# Patient Record
Sex: Male | Born: 1955
Health system: Southern US, Community
[De-identification: ages and names within clinical notes are randomized; demographics above are authoritative.]

## PROBLEM LIST (undated history)

## (undated) DIAGNOSIS — N179 Acute kidney failure, unspecified: Secondary | ICD-10-CM

## (undated) DIAGNOSIS — I639 Cerebral infarction, unspecified: Secondary | ICD-10-CM

## (undated) DIAGNOSIS — I6381 Other cerebral infarction due to occlusion or stenosis of small artery: Secondary | ICD-10-CM

## (undated) DIAGNOSIS — D170 Benign lipomatous neoplasm of skin and subcutaneous tissue of head, face and neck: Secondary | ICD-10-CM

## (undated) DIAGNOSIS — N400 Enlarged prostate without lower urinary tract symptoms: Secondary | ICD-10-CM

## (undated) HISTORY — DX: Benign prostatic hyperplasia without lower urinary tract symptoms: N40.0

## (undated) HISTORY — DX: Cerebral infarction, unspecified: I63.9

## (undated) HISTORY — DX: Acute kidney failure, unspecified: N17.9

---

## 2005-07-04 ENCOUNTER — Emergency Department (HOSPITAL_COMMUNITY): Admission: EM | Admit: 2005-07-04 | Discharge: 2005-07-04 | Payer: Self-pay | Admitting: Emergency Medicine

## 2005-07-07 ENCOUNTER — Emergency Department (HOSPITAL_COMMUNITY): Admission: EM | Admit: 2005-07-07 | Discharge: 2005-07-07 | Payer: Self-pay | Admitting: Emergency Medicine

## 2005-07-10 ENCOUNTER — Emergency Department (HOSPITAL_COMMUNITY): Admission: EM | Admit: 2005-07-10 | Discharge: 2005-07-10 | Payer: Self-pay | Admitting: Emergency Medicine

## 2005-07-12 ENCOUNTER — Emergency Department (HOSPITAL_COMMUNITY): Admission: EM | Admit: 2005-07-12 | Discharge: 2005-07-12 | Payer: Self-pay | Admitting: *Deleted

## 2005-07-13 ENCOUNTER — Emergency Department (HOSPITAL_COMMUNITY): Admission: EM | Admit: 2005-07-13 | Discharge: 2005-07-13 | Payer: Self-pay | Admitting: Family Medicine

## 2008-09-03 ENCOUNTER — Emergency Department (HOSPITAL_COMMUNITY): Admission: EM | Admit: 2008-09-03 | Discharge: 2008-09-03 | Payer: Self-pay | Admitting: Emergency Medicine

## 2008-10-25 ENCOUNTER — Ambulatory Visit: Payer: Self-pay | Admitting: Family Medicine

## 2008-11-05 ENCOUNTER — Encounter: Payer: Self-pay | Admitting: Sports Medicine

## 2008-11-05 ENCOUNTER — Ambulatory Visit: Payer: Self-pay | Admitting: Family Medicine

## 2008-11-05 DIAGNOSIS — M7520 Bicipital tendinitis, unspecified shoulder: Secondary | ICD-10-CM | POA: Insufficient documentation

## 2008-11-05 LAB — CONVERTED CEMR LAB
Cholesterol: 184 mg/dL (ref 0–200)
HDL: 49 mg/dL (ref >39)
LDL Cholesterol: 106 mg/dL — ABNORMAL HIGH (ref 0–99)
PSA: 0.75 ng/mL (ref 0.10–4.00)
Total CHOL/HDL Ratio: 3.8
Triglycerides: 144 mg/dL (ref <150)
VLDL: 29 mg/dL (ref 0–40)

## 2008-11-28 ENCOUNTER — Encounter: Payer: Self-pay | Admitting: *Deleted

## 2008-11-28 ENCOUNTER — Ambulatory Visit: Payer: Self-pay | Admitting: Family Medicine

## 2008-12-03 ENCOUNTER — Ambulatory Visit: Payer: Self-pay

## 2008-12-03 DIAGNOSIS — M75120 Complete rotator cuff tear or rupture of unspecified shoulder, not specified as traumatic: Secondary | ICD-10-CM | POA: Insufficient documentation

## 2008-12-26 ENCOUNTER — Ambulatory Visit: Payer: Self-pay | Admitting: Sports Medicine

## 2009-02-07 ENCOUNTER — Ambulatory Visit (HOSPITAL_COMMUNITY): Admission: RE | Admit: 2009-02-07 | Discharge: 2009-02-07 | Payer: Self-pay | Admitting: Family Medicine

## 2009-02-07 ENCOUNTER — Ambulatory Visit: Payer: Self-pay | Admitting: Family Medicine

## 2009-02-07 ENCOUNTER — Encounter: Payer: Self-pay | Admitting: Sports Medicine

## 2009-02-07 ENCOUNTER — Telehealth: Payer: Self-pay | Admitting: Sports Medicine

## 2009-02-07 LAB — CONVERTED CEMR LAB
BUN: 18 mg/dL (ref 6–23)
CO2: 24 meq/L (ref 19–32)
Calcium: 9.4 mg/dL (ref 8.4–10.5)
Chloride: 107 meq/L (ref 96–112)
Creatinine, Ser: 1.16 mg/dL (ref 0.40–1.50)
Glucose, Bld: 73 mg/dL (ref 70–99)
HCT: 37.8 % — ABNORMAL LOW (ref 39.0–52.0)
Hemoglobin: 12.3 g/dL — ABNORMAL LOW (ref 13.0–17.0)
MCHC: 32.5 g/dL (ref 30.0–36.0)
MCV: 83.6 fL (ref 78.0–100.0)
Platelets: 258 K/uL (ref 150–400)
Potassium: 4.1 meq/L (ref 3.5–5.3)
RBC: 4.52 M/uL (ref 4.22–5.81)
RDW: 14.5 % (ref 11.5–15.5)
Sodium: 141 meq/L (ref 135–145)
WBC: 4.3 10*3/microliter (ref 4.0–10.5)

## 2009-02-13 ENCOUNTER — Encounter: Payer: Self-pay | Admitting: Sports Medicine

## 2009-02-21 ENCOUNTER — Ambulatory Visit: Payer: Self-pay | Admitting: Family Medicine

## 2009-02-21 DIAGNOSIS — D649 Anemia, unspecified: Secondary | ICD-10-CM | POA: Insufficient documentation

## 2009-02-26 ENCOUNTER — Ambulatory Visit (HOSPITAL_COMMUNITY): Admission: RE | Admit: 2009-02-26 | Discharge: 2009-02-26 | Payer: Self-pay | Admitting: Family Medicine

## 2009-02-26 ENCOUNTER — Encounter: Payer: Self-pay | Admitting: Family Medicine

## 2009-04-09 ENCOUNTER — Ambulatory Visit: Payer: Self-pay | Admitting: Family Medicine

## 2009-04-09 ENCOUNTER — Encounter: Payer: Self-pay | Admitting: Sports Medicine

## 2009-04-09 LAB — CONVERTED CEMR LAB
HCT: 36.6 % — ABNORMAL LOW (ref 39.0–52.0)
Hemoglobin: 11.8 g/dL — ABNORMAL LOW (ref 13.0–17.0)
Iron: 104 ug/dL (ref 42–165)
MCHC: 32.2 g/dL (ref 30.0–36.0)
MCV: 85.7 fL (ref 78.0–100.0)
Platelets: 265 10*3/uL (ref 150–400)
RBC: 4.27 M/uL (ref 4.22–5.81)
RDW: 14.4 % (ref 11.5–15.5)
Retic Ct Pct: 1 % (ref 0.4–3.1)
Saturation Ratios: 31 % (ref 20–55)
TIBC: 331 ug/dL (ref 215–435)
UIBC: 227 ug/dL
WBC: 3.7 10*3/microliter — ABNORMAL LOW (ref 4.0–10.5)

## 2009-04-17 ENCOUNTER — Ambulatory Visit: Payer: Self-pay

## 2009-04-18 ENCOUNTER — Encounter: Payer: Self-pay | Admitting: Sports Medicine

## 2009-04-23 ENCOUNTER — Ambulatory Visit: Payer: Self-pay | Admitting: Family Medicine

## 2009-04-23 ENCOUNTER — Encounter: Payer: Self-pay | Admitting: Sports Medicine

## 2009-04-23 LAB — CONVERTED CEMR LAB
Basophils Absolute: 0 10*3/uL (ref 0.0–0.1)
Basophils Relative: 1 % (ref 0–1)
Eosinophils Absolute: 0.1 K/uL (ref 0.0–0.7)
Eosinophils Relative: 4 % (ref 0–5)
HCT: 39.2 % (ref 39.0–52.0)
Hemoglobin: 12.6 g/dL — ABNORMAL LOW (ref 13.0–17.0)
Hgb A2 Quant: 2.6 % (ref 2.2–3.2)
Hgb A: 97.4 % (ref 96.8–97.8)
Hgb F Quant: 0 % (ref 0.0–2.0)
Hgb S Quant: 0 % (ref 0.0–0.0)
Lymphocytes Relative: 39 % (ref 12–46)
Lymphs Abs: 1.3 K/uL (ref 0.7–4.0)
MCHC: 32.1 g/dL (ref 30.0–36.0)
MCV: 87.5 fL (ref 78.0–100.0)
Monocytes Absolute: 0.6 10*3/uL (ref 0.1–1.0)
Monocytes Relative: 18 % — ABNORMAL HIGH (ref 3–12)
Neutro Abs: 1.3 10*3/uL — ABNORMAL LOW (ref 1.7–7.7)
Neutrophils Relative %: 39 % — ABNORMAL LOW (ref 43–77)
Platelets: 289 10*3/uL (ref 150–400)
RBC: 4.48 M/uL (ref 4.22–5.81)
RDW: 14.7 % (ref 11.5–15.5)
Sickle Cell Screen: NEGATIVE
Vitamin B-12: 953 pg/mL — ABNORMAL HIGH (ref 211–911)
WBC: 3.3 10*3/microliter — ABNORMAL LOW (ref 4.0–10.5)

## 2009-05-01 ENCOUNTER — Encounter: Admission: RE | Admit: 2009-05-01 | Discharge: 2009-06-12 | Payer: Self-pay | Admitting: Sports Medicine

## 2009-05-01 ENCOUNTER — Telehealth: Payer: Self-pay | Admitting: *Deleted

## 2009-05-01 ENCOUNTER — Encounter: Payer: Self-pay | Admitting: Sports Medicine

## 2009-05-03 ENCOUNTER — Ambulatory Visit: Payer: Self-pay | Admitting: Gastroenterology

## 2009-05-09 ENCOUNTER — Encounter: Payer: Self-pay | Admitting: Sports Medicine

## 2009-05-10 ENCOUNTER — Telehealth: Payer: Self-pay | Admitting: Gastroenterology

## 2009-05-16 ENCOUNTER — Ambulatory Visit: Payer: Self-pay | Admitting: Gastroenterology

## 2009-05-16 ENCOUNTER — Encounter: Payer: Self-pay | Admitting: Gastroenterology

## 2009-05-21 ENCOUNTER — Encounter: Payer: Self-pay | Admitting: Gastroenterology

## 2009-07-10 ENCOUNTER — Ambulatory Visit: Payer: Self-pay | Admitting: Sports Medicine

## 2009-07-11 ENCOUNTER — Ambulatory Visit: Payer: Self-pay | Admitting: Family Medicine

## 2009-07-11 ENCOUNTER — Encounter: Payer: Self-pay | Admitting: Sports Medicine

## 2009-07-11 LAB — CONVERTED CEMR LAB
ALT: 36 U/L (ref 0–53)
AST: 38 U/L — ABNORMAL HIGH (ref 0–37)
Albumin: 4 g/dL (ref 3.5–5.2)
Alkaline Phosphatase: 104 units/L (ref 39–117)
BUN: 16 mg/dL (ref 6–23)
CO2: 25 meq/L (ref 19–32)
Calcium: 9.1 mg/dL (ref 8.4–10.5)
Chloride: 105 meq/L (ref 96–112)
Creatinine, Ser: 1.25 mg/dL (ref 0.40–1.50)
Glucose, Bld: 88 mg/dL (ref 70–99)
Potassium: 4.1 meq/L (ref 3.5–5.3)
Sodium: 141 meq/L (ref 135–145)
Total Bilirubin: 0.3 mg/dL (ref 0.3–1.2)
Total Protein: 7.6 g/dL (ref 6.0–8.3)

## 2009-07-18 ENCOUNTER — Encounter (INDEPENDENT_AMBULATORY_CARE_PROVIDER_SITE_OTHER): Payer: Self-pay | Admitting: *Deleted

## 2009-10-14 ENCOUNTER — Telehealth (INDEPENDENT_AMBULATORY_CARE_PROVIDER_SITE_OTHER): Payer: Self-pay | Admitting: Family Medicine

## 2010-01-08 ENCOUNTER — Ambulatory Visit: Payer: Self-pay | Admitting: Sports Medicine

## 2010-02-06 ENCOUNTER — Telehealth (INDEPENDENT_AMBULATORY_CARE_PROVIDER_SITE_OTHER): Payer: Self-pay | Admitting: Family Medicine

## 2010-02-19 ENCOUNTER — Ambulatory Visit: Payer: Self-pay | Admitting: Sports Medicine

## 2010-03-06 ENCOUNTER — Ambulatory Visit: Payer: Self-pay | Admitting: Family Medicine

## 2010-03-06 ENCOUNTER — Encounter: Payer: Self-pay | Admitting: Sports Medicine

## 2010-03-06 DIAGNOSIS — F528 Other sexual dysfunction not due to a substance or known physiological condition: Secondary | ICD-10-CM | POA: Insufficient documentation

## 2010-03-06 LAB — CONVERTED CEMR LAB
Basophils Absolute: 0 10*3/uL (ref 0.0–0.1)
Basophils Relative: 1 % (ref 0–1)
Eosinophils Absolute: 0.1 K/uL (ref 0.0–0.7)
Eosinophils Relative: 2 % (ref 0–5)
HCT: 39 % (ref 39.0–52.0)
Hemoglobin: 12.9 g/dL — ABNORMAL LOW (ref 13.0–17.0)
Lymphocytes Relative: 36 % (ref 12–46)
Lymphs Abs: 1.4 K/uL (ref 0.7–4.0)
MCHC: 33.1 g/dL (ref 30.0–36.0)
MCV: 86.3 fL (ref 78.0–100.0)
Monocytes Absolute: 0.5 K/uL (ref 0.1–1.0)
Monocytes Relative: 13 % — ABNORMAL HIGH (ref 3–12)
Neutro Abs: 2 10*3/uL (ref 1.7–7.7)
Neutrophils Relative %: 49 % (ref 43–77)
Platelets: 272 10*3/uL (ref 150–400)
RBC: 4.52 M/uL (ref 4.22–5.81)
RDW: 15.4 % (ref 11.5–15.5)
WBC: 4 10*3/microliter (ref 4.0–10.5)

## 2010-04-01 ENCOUNTER — Encounter: Payer: Self-pay | Admitting: Sports Medicine

## 2010-06-03 ENCOUNTER — Ambulatory Visit: Payer: Self-pay | Admitting: Family Medicine

## 2010-06-05 ENCOUNTER — Telehealth: Payer: Self-pay | Admitting: Family Medicine

## 2010-06-06 ENCOUNTER — Telehealth: Payer: Self-pay | Admitting: *Deleted

## 2010-06-09 ENCOUNTER — Telehealth: Payer: Self-pay | Admitting: *Deleted

## 2010-06-17 ENCOUNTER — Ambulatory Visit: Payer: Self-pay | Admitting: Family Medicine

## 2010-07-07 ENCOUNTER — Encounter: Payer: Self-pay | Admitting: Sports Medicine

## 2010-10-02 NOTE — Assessment & Plan Note (Signed)
 Summary: FU/KH   Vital Signs:  Patient profile:   55 year old male Height:      70.5 inches Weight:      154.3 pounds BMI:     21.91 Temp:     98.5 degrees F oral Pulse rate:   91 / minute BP sitting:   114 / 80  (left arm)  Vitals Entered By: Letitia Reusing (November 28, 2008 3:05 PM) Hemoccult Result Date:  11/28/2008 Hemoccult Result:  normal Hemoccult Next Due:  1 yr  Is Patient Diabetic? No   History of Present Illness: 39 male with no significant PMHx presents for CPE and follow up of his shoulder pain.  Shoulder pain:  Present  ~ 3 months, see details in previous note, character unchanged.  Present ith overhead activities, anterior shoulder, right side.    CPE: No complaints, issues, or problems.  Pt reports that he is still trying to get [facility] certified so he can get his colonoscopy.  Habits & Providers     Tobacco Status: never  Allergies: No Known Drug Allergies  Past History:  Past Medical History:    Pyelonephritis - 1971 and 1977 (11/05/2008)  Past Surgical History:    Appendectomy-1966    Left knee tendon repair - 1994     (10/25/2008)  Family History:    Family History Hypertension- mother     Diabetes, HTN - Grandmother    Father died of unknown causes in his 55s (11/05/2008)  Social History:    Pt lives alone, but has a good friend that he spends time with - [name].  She is also a patient at [facility].  He enjoys playing cards and basketball.  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 (11/05/2008)  Review of Systems       12 point negative except as in HPI  Physical Exam  General:  Well-developed,well-nourished,in no acute distress; alert,appropriate and cooperative throughout examination Head:  Normocephalic and atraumatic without obvious abnormalities.  Eyes:  No corneal or conjunctival inflammation noted. EOMI. Perrla. Ears:  External ear exam shows no significant lesions or deformities.  Nose:  External nasal  examination shows no deformity or inflammation. Nasal mucosa are pink and moist without lesions or exudates. Mouth:  Oral mucosa and oropharynx without lesions or exudates.  Neck:  No deformities, masses, or tenderness noted. Chest Wall:  No deformities, masses, tenderness or gynecomastia noted. Lungs:  Normal respiratory effort, chest expands symmetrically. Lungs are clear to auscultation, no crackles or wheezes. Heart:  Normal rate and regular rhythm. S1 and S2 normal without gallop, murmur, click, rub or other extra sounds. Abdomen:  Bowel sounds positive,abdomen soft and non-tender without masses, organomegaly or hernias noted. Rectal:  No external abnormalities noted. Normal sphincter tone. No rectal masses or tenderness. Prostate:  Prostate gland firm and smooth, no enlargement, nodularity, tenderness, mass, asymmetry or induration.   Shoulder/Elbow Exam  General:    Well-developed, well-nourished, normal body habitus; no deformities, normal grooming.    Skin:    Intact, no scars, lesions, rashes, cafe au lait spots or bruising.    Inspection:    Inspection is normal.    Palpation:    tenderness R-biciptal tendon:   Vascular:       Sensory:    Gross sensation intact in the upper extremities.    Motor:    Normal strength in the upper extremities.    Reflexes:    Normal reflexes in the upper extremities.    Shoulder  Exam:    Right:    Inspection:  Normal    Palpation:  Abnormal       Location:  right bicipital groove    Stability:  stable    Tenderness:  right bicipital groove    Swelling:  no    Erythema:  no    Popping and pain on palpation right bicipital groove with internal and external rotation of glenohumeral joint.    Left:    Inspection:  Normal    Palpation:  Normal    Stability:  stable    Tenderness:  no    Swelling:  no    Erythema:  no  Forearm Exam:    Right:    Inspection:  Normal    Palpation:  Normal    Stability:  stable     Tenderness:  no    Swelling:  no    Erythema:  no    Left:    Inspection:  Normal    Palpation:  Normal    Stability:  stable    Tenderness:  no    Swelling:  no    Erythema:  no  Elbow Exam:    Right:    Inspection:  Normal    Palpation:  Normal    Stability:  stable    Tenderness:  no    Swelling:  no    Erythema:  no    Left:    Inspection:  Normal    Palpation:  Normal    Stability:  stable    Tenderness:  no    Swelling:  no    Erythema:  no  Impingement Sign NEER:    Right negative Impingement Sign HAWKINS:    Right negative Apprehension Sign:    Right positive Yerguson:    Right positive Speeds:    Right positive   Impression & Recommendations:  Problem # 1:  BICEPS TENDINITIS, RIGHT (ICD-726.12) Symptoms and sins suggestive of bicipital tendinosis.   Will refer to sports medicine for further eval and possible sheath injections.  Pain fairly well controlled with ultram .   Orders: [facility] - Est  40-64 yrs [phone]) Sports Medicine (Sports Med)  Problem # 2:  Preventive Health Care (ICD-V70.0) Normal physical exam.  Hemoccult negative, pt working on [employer] for colonoscopy.  Orders: [facility] - Est  40-64 yrs (00603)  Complete Medication List: 1)  Adult Aspirin  Ec Low Strength 81 Mg Tbec (Aspirin ) .... One tab by mouth daily 2)  Ultram  50 Mg Tabs (Tramadol  hcl) .... One tab by mouth q4h as needed for pain  Patient Instructions: 1)  Good to see you today, with the location of your pain it seems like you have biceps tendinosis.  I'm glad the ultram  is working well but I would like the sports medicine specialists to see you and weight in on your diagnosis and treatment.  I will set you up with a referral to them.  Your physical exam was completely normal. 2)  Come back to see me in one year, or sooner if you have any problems or concerns.  Try to get [facility] certified so you can get your colonoscopy.  Call the office when you are certified, and call if you need  any refills. 3)  See me back in one year. 4)  -Dr. ONEIDA.

## 2010-10-02 NOTE — Assessment & Plan Note (Signed)
 Summary: FU/KH   Vital Signs:  Patient profile:   55 year old male Weight:      151.2 pounds Temp:     97.0 degrees F oral Pulse rate:   85 / minute BP sitting:   126 / 88  (left arm)  Vitals Entered By: Letitia Reusing (February 21, 2009 1:54 PM) CC: f/u Is Patient Diabetic? No   Primary Care Provider:  Debby Petties MD  CC:  f/u.  History of Present Illness: 19M with Hx dizziness here for fu of this.  Description from previous note:  Describes it as both spinning room and almost passing out feeling.  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  Has never actually passed out.  Drinks plenty of fluids, normal and unchanged urine output, no CP, no SOB, no diaphoresis.  Usually works in the morning before it gets hot.  Does not occur with turning of head, No weakness, numbness, tingling, dysarthria.  This has never happened before.  When he does get the symptoms, they resolve spontaneously in a matter of seconds, no association with rest.     Today, has been going on for 3 weeks, feels like it is happening less often.  I had heard a systolic murmur at the last visit and was concerned this may be AS.  Plan was to get an ECHO but he couldnt afford this and was working on getting [employer].  Still no CP, SOB, syncope.  Has his last meeting with [facility] today and should be able to get an ECHO done.    Habits & Providers  Alcohol-Tobacco-Diet     Tobacco Status: never  Allergies: No Known Drug Allergies  Past History:  Past Medical History: Last updated: 11/05/2008 Pyelonephritis - 1971 and 1977  Past Surgical History: Last updated: 10/25/2008 Appendectomy-1966 Left knee tendon repair - 1994  Family History: Last updated: 11/05/2008 Family History Hypertension- mother  Diabetes, HTN - Grandmother  Father died of unknown causes in his 27s  Social History: Last updated: 11/05/2008 Pt lives alone,  but has a good friend that he spends time with - [name].  She is also a patient at [facility].  He enjoys playing cards and basketball.  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  Review of Systems       See HPI  Physical Exam  General:  Well-developed,well-nourished,in no acute distress; alert,appropriate and cooperative throughout examination Eyes:  No corneal or conjunctival inflammation noted. EOMI. Perrla. Funduscopic exam benign, without hemorrhages, exudates or papilledema. Vision grossly normal. Lungs:  Normal respiratory effort, chest expands symmetrically. Lungs are clear to auscultation, no crackles or wheezes. Heart:  Normal rate and regular rhythm. S1 and S2 normal without gallop, click, rub or other extra sounds. 2/6 systolic murmur auscultated in aortic area and at cardiac apex.  No radiation.  No change with valsalva or change to standing position.  Pulses 2+ throughout. Abdomen:  Bowel sounds positive,abdomen soft and non-tender without masses, organomegaly or hernias noted. Extremities:  No clubbing, cyanosis, edema, or deformity noted with normal full range of motion of all joints.     Impression & Recommendations:  Problem # 1:  DIZZINESS (ICD-780.4) Assessment Improved This is his primary complaint, improved slightly, negative orthostatic vitals, negative EKG, normal BMET, CBC with mildly low Hb but normal MCV.  With systolic murmur need to get ECHO to r/o AS or other valvular abnormality.  Orders: [facility]- Est  Level 4 (99214) 2 D Echo (2 D Echo)  Problem # 2:  SYSTOLIC MURMUR (PRI-214.2) Assessment: Unchanged Still present, will check ECHO.  Earliest opening is Tuesday the 29th.  Orders: [facility]- Est  Level 4 (99214) 2 D Echo (2 D Echo)  Problem # 3:  ANEMIA, NORMOCYTIC (ICD-285.9) Assessment: New Unclear etiology.  As this pt has not had a colonoscopy it is possible there is an occult GI bleed.  If he gets [employer] certified I would like to get  him scheduled for a colonoscopy ASAP.  Will do Iron studies at next visit, Hb electrophoresis.  Complete Medication List: 1)  Adult Aspirin  Ec Low Strength 81 Mg Tbec (Aspirin ) .... One tab by mouth daily 2)  Ultram  50 Mg Tabs (Tramadol  hcl) .... One tab by mouth q4h as needed for pain 3)  Mobic  15 Mg Tabs (Meloxicam ) .... One tab by mouth qday with food  Patient Instructions: 1)  Good to see you today, 2)  I want you to get an ECHO of you heart.  Continue to keep away from sports and physical activity for now. 3)  Drink  ~8 glasses of water or gatorade each day. 4)  Call 911 if you have any chest pain or pass out. 5)  Come back to see me in 4 weeks.  6)  OK to double book appointments. 7)  Continue to work on getting [employer] certified.  Call the office if you are unable to get the ECHO done within a week. 8)  -Dr. ONEIDA.  Flex Sig Next Due:  Not Indicated

## 2010-10-02 NOTE — Assessment & Plan Note (Signed)
Summary: David [name]   Vital Signs:  Patient profile:   55 year old male Weight:      164.2 pounds Temp:     98.5 degrees F oral Pulse rate:   92 / minute Pulse rhythm:   regular BP sitting:   124 / 81  (right arm) Cuff size:   regular  Vitals Entered By: Audelia Hives CMA (March 06, 2010 3:11 PM) CC: follow-up visit   Primary Care Provider:  [name] MD  CC:  follow-up visit.  History of Present Illness: Here for several issues:  R supraspinatus tear:  Has been followed at [facility], doing better with strength and ROM but still not back to baseline.  Wrist pain:  hx fx in wrist, now with arthritis at what he indicates at the radiocarpal joint.  Persistently painful.  Never injected.  Erectile dysfunction:  Has been a chronic issue.  Unable to attain satisfactor erection for penetration, also unable to maintain satisfactory erection during masturbation.  Often wakes up from sleep with full erections however.  No significant stressors in his life.  No saddle numbness, no back pain, no stool/urinary incontinence.  Vision:  Now with bifocals.  Pancytopenia:  Has been taking iron now regularly.    Current Medications (verified): 1)  Adult Aspirin Ec Low Strength 81 Mg Tbec (Aspirin) .... One Tab By Mouth Daily 2)  Ultram 50 Mg Tabs (Tramadol Hcl) .... One Tab By Mouth Q4h As Needed For Pain 3)  Mobic 15 Mg Tabs (Meloxicam) .... One Tab By Mouth Qday With Food 4)  Amitriptyline Hcl 50 Mg Tabs (Amitriptyline Hcl) .Marland Kitchen.. 1 By Mouth Qhs 5)  Fe Tabs 325 (65 Fe) Mg Tbec (Ferrous Sulfate) .... One Tab By Mouth Bid 6)  Genteal Mild 0.2 % Soln (Hypromellose) .... 2 Drops in Each Eye As Needed For Burning Sensation. 7)  Viagra 100 Mg Tabs (Sildenafil Citrate) .... One Tab 30 Mins To An Hour Before Relations.  Allergies (verified): 1)  ! Codeine  Past History:  Past Medical History: Pyelonephritis - 1971 and 1977 Right supraspinatus tear (2010) Left radiocarpal arthritis  Review of  Systems       See HPI  Physical Exam  General:  Well-developed,well-nourished,in no acute distress; alert,appropriate and cooperative throughout examination Lungs:  Normal respiratory effort, chest expands symmetrically. Lungs are clear to auscultation, no crackles or wheezes. Heart:  Normal rate and regular rhythm. S1 and S2 normal without gallop, murmur, click, rub or other extra sounds. Abdomen:  Bowel sounds positive,abdomen soft and non-tender without masses, organomegaly or hernias noted. Msk:  Wrist: L Inspection normal with no visible erythema.  There is some swelling distal to Lister's Tubercle in the radiocarpal joint. ROM smooth and normal with limited flexion and extension. Palpation is normal over metacarpals, navicular, lunate, and TFCC; tendons without tenderness/ swelling Some tenderness noted on palpation in sulcus just distal to radiocarpal joint. Strength 5/5 in all directions without pain. Negative Finkelstein, tinel's and phalens.  Skin:  Nails: Healthy toenails starting to grow out. Additional Exam:  Consent obtained and verified. Sterile betadine prep. Furthur cleansed with alcohol. Topical analgesic spray: Ethyl chloride. Joint:Left radiocarpal joint Approached in typical fashion with spot marked with pen before sterile prep.  25g needle advanced into sulcus just distal to Lister's Tubercle on radius avoiding abductor pollicis longus and extensor indicis tendons.  Needle dropped easily into joint space. Completed without difficulty Meds:0.5cc lidocaine, 0.5cc kenalog 40 Aftercare instructions and Red flags advised.  Impression & Recommendations:  Problem # 1:  ERECTILE DYSFUNCTION, NON-ORGANIC (ICD-302.72) Assessment New Adequate nocturnal erections make organic source of ED unlikely.  Will still give viagra a try.  Pt to let me know if it works and I can rx more.  His updated medication list for this problem includes:    Viagra 100 Mg Tabs (Sildenafil  citrate) ..... One tab 30 mins to an hour before relations.  Orders: [place]- Est  Level 4 [id number])  Problem # 2:  ARTHRITIS, LEFT WRIST [id number]) Assessment: New Injected.  Will follow.  Orders: [place]- Est  Level 4 (99214) Injection, intermediate joint - [place] (20605)  Problem # 3:  ONYCHOMYCOSIS, TOENAILS (ICD-110.1) Assessment: Improved Resolving, healthy nail appears to be growing out.  The following medications were removed from the medication list:    Lamisil 250 Mg Tabs (Terbinafine hcl) ..... One tab by mouth daily x 12 weeks.  Orders: [place]- Est  Level 4 [id number])  Problem # 4:  SHOULDER PAIN, LEFT (ICD-719.41) Assessment: Improved symptoms improved but he will likely never return to baseline with what appeared to be a complete tear of his supraspinatus.  His updated medication list for this problem includes:    Adult Aspirin Ec Low Strength 81 Mg Tbec (Aspirin) ..... One tab by mouth daily    Ultram 50 Mg Tabs (Tramadol hcl) ..... One tab by mouth q4h as needed for pain    Mobic 15 Mg Tabs (Meloxicam) ..... One tab by mouth qday with food  Problem # 5:  ANEMIA, NORMOCYTIC (ICD-285.9) Assessment: Unchanged Rechecking CBC.  His updated medication list for this problem includes:    Fe Tabs 325 (65 Fe) Mg Tbec (Ferrous sulfate) ..... One tab by mouth bid  Orders: CBC w/Diff-[facility] [id number])  Problem # 6:  ROTATOR CUFF TEAR (ICD-727.61) Assessment: Improved See #4   Orders: [place]- Est  Level 4 [id number])  Complete Medication List: 1)  Adult Aspirin Ec Low Strength 81 Mg Tbec (Aspirin) .... One tab by mouth daily 2)  Ultram 50 Mg Tabs (Tramadol hcl) .... One tab by mouth q4h as needed for pain 3)  Mobic 15 Mg Tabs (Meloxicam) .... One tab by mouth qday with food 4)  Amitriptyline Hcl 50 Mg Tabs (Amitriptyline hcl) .Marland Kitchen.. 1 by mouth qhs 5)  Fe Tabs 325 (65 Fe) Mg Tbec (Ferrous sulfate) .... One tab by mouth bid 6)  Genteal Mild 0.2 % Soln (Hypromellose) .... 2 drops in each eye as  needed for burning sensation. 7)  Viagra 100 Mg Tabs (Sildenafil citrate) .... One tab 30 mins to an hour before relations.  Patient Instructions: 1)  Injected wrist. 2)  Viagra. 3)  Checking blood levels. 4)  Come back to see me as needed and let me know if the viagra works and I can rx more. 5)  -Dr. Darene Lamer. Prescriptions: VIAGRA 100 MG TABS (SILDENAFIL CITRATE) One tab 30 mins to an hour before relations.  #2 x 0   Entered and Authorized by:   [name] MD   Signed by:   [name] MD on 03/06/2010   Method used:   Print then Give to Patient   RxID:   [id number]   Last Hemoccult Result: normal (11/28/2008 2:59:24 PM) Hemoccult Next Due:  Not Indicated

## 2010-10-02 NOTE — Progress Notes (Signed)
----   Converted from flag ---- ---- 06/05/2010 1:34 PM, April Manson CMA wrote: ---- 06/05/2010 1:18 PM, Orlie Pollen wrote: [id number] - TRAMADOL NOT WORKING WOULD LIKE SOMETHING ELSE FOR PAIN ------------------------------  ok to call in small # vicodin  Vicodin 5-500, 1 po q 6 hours prn pain, #15, 0 refills short term, this ok, but should not be used chronically in this type of shoulder case.  Appended Document:  [facility] since pt has [place] coverage

## 2010-10-02 NOTE — Assessment & Plan Note (Signed)
Summary: F/U [facility]   Vital Signs:  Patient profile:   55 year old male Pulse rate:   80 / minute BP sitting:   132 / 89  (right arm)  Vitals Entered By: Caesar Chestnut RN (June 03, 2010 2:00 PM) CC: f/u rt shoulder   History of Present Illness: 55 yo M with hx of rotator cuff injury on R, approx 18 months ago, with continued pain and decreased function of R shoulder.  Patient was last seen in June of this year and at the time was interested in doing more exercise in addition to the shoulder rehab exercises he had been doing. This is my first evaluation of patient.  About two weeks after prior OV, he had pain while doing biceps curls. He stopped doing those exercises, but has continued his other shoulder therapy exercises. He also has continued taking Tramadol. Unfortunately, his pain has remained stable since he reinjured the shoulder. Recently, he has had trouble working because many of the jobs he finds require lifting and working with his arms above his head.  he continues and significantly altered mechanics, and difficulty abducting his shoulder. He is able to do this with some compensation, but his shoulder function is notably diminished visually.  Allergies: 1)  ! Codeine  Past History:  Past medical, surgical, family and social histories (including risk factors) reviewed, and no changes noted (except as noted below).  Past Medical History: Reviewed history from 03/06/2010 and no changes required. Pyelonephritis - 1971 and 1977 Right supraspinatus tear (2010) Left radiocarpal arthritis  Past Surgical History: Reviewed history from 10/25/2008 and no changes required. Appendectomy-1966 Left knee tendon repair - 1994  Family History: Reviewed history from 11/05/2008 and no changes required. Family History Hypertension- mother  Diabetes, HTN - Grandmother  Father died of unknown causes in his 61s  Social History: Reviewed history from 11/05/2008 and no changes  required. Pt lives alone, but has a good friend that he spends time with - Aviva Kluver.  She is also a patient at [facility].  He enjoys playing cards and basketball.  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  Review of Systems       REVIEW OF SYSTEMS  GEN: No systemic complaints, no fevers, chills, sweats, or other acute illnesses MSK: Detailed in the HPI GI: tolerating PO intake without difficulty Neuro: No numbness, parasthesias, or tingling associated. Otherwise the pertinent positives of the ROS are noted above.    Physical Exam  General:  Well-developed,well-nourished,in no acute distress; alert,appropriate and cooperative throughout examination Head:  Normocephalic and atraumatic without obvious abnormalities. No apparent alopecia or balding. Ears:  no external deformities.   Nose:  no external deformity.   Lungs:  normal respiratory effort.   Msk:  Shoulder: Right shoulder with visible muscle wasting, particularly of trapezius muscle - quite dramatic. Patient holds L shoulder higher than R shoulder. prominent subacromial bursa, visibly, with internal rotation.  Tenderness to palpation over biceps tendon. Patient also has some small areas of swelling vs muscle spasm over trapezius on R.  notably altered shoulder function and range of motion on the right with elevation of the right hemi scapula and pain with arc of motion and notable scapular dyskinesis and elevation of that scapula. Internal and external rotation is intact.  4/5 strength with empty can on R, 5/5 on L. 5/5 deltoid bilaterally. 5/5 flexion/extension at elbow bilaterally.  drop sign causes pain, but is not negative.  Positive Neer, positive Michel Bickers test.  Minimally positive crossover test.    Impression & Recommendations:  Problem # 1:  ROTATOR CUFF TEAR (ICD-727.61) Patient has a history of rotator cuff tear, supraspinatus tendon high grade partial thickness, visualized on ultrasound on  prior examinations.  The patient has continued symptoms, diminished function, loss of abduction strength and function, and he is a physically active 55 year old male. He works Retail buyer for a living. In my opinion, this patient has failed conservative management, and needs to have the opinion of a shoulder surgeon for evaluation and consideration of definitive management.  s/p PT x 6 weeks. Previously his shoulder was injected as well.  We are going to arrange a consultation with [facility] to get their opinion on this case. I appreciate their assistance.  c/w HEP, RTC str, scapular stabilization.  Problem # 2:  BURSITIS, RIGHT SHOULDER (ICD-726.10)  Complete Medication List: 1)  Adult Aspirin Ec Low Strength 81 Mg Tbec (Aspirin) .... One tab by mouth daily 2)  Ultram 50 Mg Tabs (Tramadol hcl) .... One tab by mouth q4h as needed for pain 3)  Mobic 15 Mg Tabs (Meloxicam) .... One tab by mouth qday with food 4)  Amitriptyline Hcl 50 Mg Tabs (Amitriptyline hcl) .Marland Kitchen.. 1 by mouth qhs 5)  Fe Tabs 325 (65 Fe) Mg Tbec (Ferrous sulfate) .... One tab by mouth bid 6)  Genteal Mild 0.2 % Soln (Hypromellose) .... 2 drops in each eye as needed for burning sensation. 7)  Viagra 100 Mg Tabs (Sildenafil citrate) .... One tab 30 mins to an hour before relations.  Appended Document: F/U [facility] Faxed referral info to [facility] ortho clinic, they state dr will review and pt will be contacted. Phone # is [phone], fax is [phone].

## 2010-10-02 NOTE — Assessment & Plan Note (Signed)
 Summary: F/U  DPG   Vital Signs:  Patient profile:   55 year old male Weight:      156.0 pounds BMI:     22.46 Temp:     97.6 degrees F Pulse rate:   89 / minute BP sitting:   114 / 83  (left arm)  Vitals Entered By: David Ip RN (April 09, 2009 4:31 PM) CC: f/u Is Patient Diabetic? Yes  Pain Assessment Patient in pain? yes     Location: shoulder Intensity: 5   Primary Care Provider:  Debby Petties MD  CC:  f/u.  History of Present Illness: 71M with right supraspinatus tear, anemia, and dizziness comes in for fu.  Supraspinatus tear:  managed by sports medicine at this point.  He plans on seeing them in the near future as his should is starting to hurt again and Mobic  and Ultram  arent helping as much anymore.  Anemia:  Unclear etiology.  Could be contributing to his dizziness.   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  never used a mask.  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  ECHO was negative.  Habits & Providers  Alcohol-Tobacco-Diet     Tobacco Status: never  Allergies: No Known Drug Allergies  Past History:  Past Surgical History: Last updated: 10/25/2008 Appendectomy-1966 Left knee tendon repair - 1994  Family History: Last updated: 11/05/2008 Family History Hypertension- mother  Diabetes, HTN - Grandmother  Father died of unknown causes in his 75s  Social History: Last updated: 11/05/2008 Pt lives alone, but has a good friend that he spends time with - David [name].  She is also a patient at [facility].  He enjoys playing cards and basketball.  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  Past Medical History: Pyelonephritis - 1971 and 1977 Right supraspinatus tear (2010)  Review of Systems       See HPI  Physical Exam  General:  Well-developed,well-nourished,in no acute distress; alert,appropriate and cooperative throughout examination Lungs:  Normal respiratory effort,  chest expands symmetrically. Lungs are clear to auscultation, no crackles or wheezes. Heart:  Normal rate and regular rhythm. S1 and S2 normal without gallop, murmur, click, rub or other extra sounds.   Impression & Recommendations:  Problem # 1:  ANEMIA, NORMOCYTIC (ICD-285.9) Assessment Unchanged Anemia panel drawn today.  Will f/u results.  Pt will also need colonoscopy as colon neoplasia needs to be ruled out.    Orders: CBC-[facility] (14972) Ferritin-[facility] [phone]) Folate-[facility] [phone]) Iron -[facility] [phone]) Iron Binding Cap (TIBC)-[facility] ([id number]) Retic-[facility] ([id number]) [facility]- Est  Level 4 (00785)  Problem # 2:  DIZZINESS (ICD-780.4) Assessment: Improved Improved.  Likely related to inhalation of fumes from painting.  Will follow.  Orders: [facility]- Est  Level 4 (00785)  Problem # 3:  SYSTOLIC MURMUR (PRI-214.2) Assessment: Improved Resolved/no longer present.  unclear etiology.  Orders: [facility]- Est  Level 4 (00785)  Problem # 4:  Preventive Health Care (ICD-V70.0) Assessment: Unchanged Now [facility] certified.  Will set up colonoscopy at next visit.  Complete Medication List: 1)  Adult Aspirin  Ec Low Strength 81 Mg Tbec (Aspirin ) .... One tab by mouth daily 2)  Ultram  50 Mg Tabs (Tramadol  hcl) .... One tab by mouth q4h as needed for pain 3)  Mobic  15 Mg Tabs (Meloxicam ) .... One tab by mouth qday with food  Patient Instructions: 1)  Great to see you , 2)  Glad you're feeling better. 3)  I want you to come  back to see me in 2 weeks to go over your lab results. 4)  Let me know when you are [facility] Certified so we can get your colonoscopy. 5)  If you paint, wear a MASK!   6)  -Dr. ONEIDA.

## 2010-10-02 NOTE — Progress Notes (Signed)
Summary: phn msg  Phone Note Call from Patient Call back at Work Phone [phone]   Caller: Patient Summary of Call: Pt wants to talk to Dr. Dianah Field about the pain in his shoulder. Initial call taken by: Raymond Gurney,  June 06, 2010 10:53 AM  Follow-up for Phone Call        Has he gotten referral to orthopaedics yet?  If not I can do that, I have been following his progress at the sports medicine center and it sounds like we have maximized non-operative treatment. Follow-up by: [name] MD,  June 06, 2010 11:20 AM  Additional Follow-up for Phone Call Additional follow up Details #1::        tramadol not working as effectively wants to know if something else can be given. has not recieved appt for ortho.  would like your opinion about surgery  Additional Follow-up by: Audelia Hives CMA,  June 06, 2010 4:16 PM    Additional Follow-up for Phone Call Additional follow up Details #2::    Having followed his symptoms I agree that it is time for surgical referral.  I am ok with him moving up to vicodin, a script will be waiting at the front for him. Follow-up by: [name] MD,  June 06, 2010 4:17 PM  Additional Follow-up for Phone Call Additional follow up Details #3:: Details for Additional Follow-up Action Taken: pt notified of rx and will pick up Additional Follow-up by: Audelia Hives CMA,  June 06, 2010 4:53 PM  New/Updated Medications: VICODIN 5-500 MG TABS (HYDROCODONE-ACETAMINOPHEN) One tab by mouth q4-6h as needed for pain. Prescriptions: VICODIN 5-500 MG TABS (HYDROCODONE-ACETAMINOPHEN) One tab by mouth q4-6h as needed for pain.  #60 x 0   Entered and Authorized by:   [name] MD   Signed by:   [name] MD on 06/06/2010   Method used:   Print then Give to Patient   RxID:   [id number]

## 2010-10-02 NOTE — Progress Notes (Signed)
 Summary: triage  Phone Note Call from Patient Call back at Home Phone [phone]   Caller: Patient Summary of Call: having dizzy spells every day for last week - would like to be seen Initial call taken by: Karna Seminole,  February 07, 2009 10:46 AM  Follow-up for Phone Call        states he is dizzy all day x 1 week. work in at [employer] today. aware of wait Follow-up by: Ginnie Mau RN,  February 07, 2009 11:07 AM

## 2010-10-02 NOTE — Assessment & Plan Note (Signed)
 Summary: np,df  Nurse Visit   Past Medical History:    Kidney infections - 1971 and 1977    Shoulder pain - 08/2008  Past Surgical History:    Appendectomy-1966    Left knee tendon repair - 1994        Risk Factors:  Tobacco use:  never Drug use:  no HIV high-risk behavior:  no Alcohol use:  no Exercise:  no Seatbelt use:  100 % Sun Exposure:  occasionally   Family History:    Family History Hypertension- mother     Diabetes - Grandmother  Social History:    Pt lives alone, but has a good friend that he spends time with - [name].  She is also a patient at [facility]  He enjoys playing cards and basketball.      Ethnicity:  Black    Occupation:  None    Education:  Automotive Engineer    Marital Status:  Divorced    Sex Orientation:  Heterosexual    Alcohol:  None    Tobacco Use:  Never    Transportation:  Ready access to a car    Residence:  Renting  Current Allergies: No known allergies     Orders Added: 1)  Est Level 1- [facility] [email or web address].COVE    ]

## 2010-10-02 NOTE — Progress Notes (Signed)
Summary: phn msg  Phone Note Call from Patient Call back at [facility] Phone [phone]   Caller: Patient Complaint: Breathing Problems Summary of Call: has orange card and wants to know where he can go to get glasses? Initial call taken by: Audie Clear,  October 14, 2009 11:27 AM  Follow-up for Phone Call        Notified pt that glasses not covered under [facility] ins Follow-up by: Isabelle Course,  October 14, 2009 12:23 PM

## 2010-10-02 NOTE — Progress Notes (Signed)
Summary: phone note/schedule appt/tb  Phone Note Outgoing Call   Call placed by: Audelia Hives CMA,  June 09, 2010 9:29 AM Summary of Call: pt wanted an appt with dr. Dianah Field  Follow-up for Phone Call        appt set Follow-up by: Audelia Hives CMA,  June 09, 2010 12:11 PM

## 2010-10-02 NOTE — Assessment & Plan Note (Signed)
 Summary: new pt/AC   Vital Signs:  Patient Profile:   55 Years Old Male Weight:      155.2 pounds Temp:     98.8 degrees F oral Pulse rate:   85 / minute BP sitting:   132 / 83  (left arm)  Vitals Entered By: Letitia Reusing (November 05, 2008 3:48 PM)             Is Patient Diabetic? No     PCP:  Debby Petties MD  Chief Complaint:  New Pt.  History of Present Illness: New pt eval, no significant PMHx,  CC now is right shoulder pain, anterior over head of humerus,  worse with overhead motions, no trauma to shoulder, no known repetitive motions, aching in nature, present 2 months, has tried motrin , tylenol , alieve, no palliation.      Current Allergies: No known allergies   Past Medical History:    Pyelonephritis - 38 and 19   Family History:    Family History Hypertension- mother     Diabetes, HTN - Grandmother        Father died of unknown causes in his 44s  Social History:    Pt lives alone, but has a good friend that he spends time with - [name].  She is also a patient at [facility].  He enjoys playing cards and basketball.  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     Review of Systems       12 point neg except as in hpi   Physical Exam  General:     Well-developed,well-nourished,in no acute distress; alert,appropriate and cooperative throughout examination Chest Wall:     No deformities, masses, tenderness or gynecomastia noted. Lungs:     Normal respiratory effort, chest expands symmetrically. Lungs are clear to auscultation, no crackles or wheezes. Heart:     Normal rate and regular rhythm. S1 and S2 normal without gallop, murmur, click, rub or other extra sounds. Msk:     Internal rotation with arm behind back and palm facing backwards most painful motion.   Shoulder/Elbow Exam  General:    Well-developed, well-nourished, normal body habitus; no deformities, normal grooming.    Skin:    Intact, no scars, lesions,  rashes, cafe au lait spots or bruising.    Inspection:    Inspection is normal.    Palpation:    TTP ant humeral head, radiating from scapula  Motor:    decreased R-IR'S.    Shoulder Exam:    Right:    Stability:  stable    FROM  Impingement Sign NEER:    Right negative Impingement Sign HAWKINS:    Right negative Apprehension Sign:    Right positive Relocation Test:    Right negative Sulcus Sign:    Right negative OBRIEN'S Test:    Right negative Yerguson:    Right negative Speeds:    Right negative AC joint Adduction Test:    Right negative    Impression & Recommendations:  Problem # 1:  SHOULDER PAIN, RIGHT (ICD-719.41) Seems to be subscapularis rotator cuff pain.  Will reassess at next visit after taking Ultram  for 2 weeks.  His updated medication list for this problem includes:    Adult Aspirin  Ec Low Strength 81 Mg Tbec (Aspirin ) ..... One tab by mouth daily    Ultram  50 Mg Tabs (Tramadol  hcl) ..... One tab by mouth q4h as needed for pain   Problem # 2:  PREVENTIVE HEALTH CARE (  ICD-V70.0) Checking lipid panel, PSA, TDAP immunization, will need to get colonoscopy, pt needs to be [facility] certified but GI office not making appts until April.  Will also start asa 81 for cardioprotection.  Orders: Lipid-[facility] ([id number])   Complete Medication List: 1)  Adult Aspirin  Ec Low Strength 81 Mg Tbec (Aspirin ) .... One tab by mouth daily 2)  Ultram  50 Mg Tabs (Tramadol  hcl) .... One tab by mouth q4h as needed for pain  Other Orders: PSA-[facility] ([id number]) Tdap => 40yrs IM (09284) Admin 1st Vaccine (09528)   Patient Instructions: 1)  Great to meet you today. 2)  Your shoulder pain seems to be a combination of biceps tendinosis and rotator cuff syndrome (specifically: subscapularis muscle). 3)  We will try tramadol  first, if this doesn't help, we can move towards joint injections. 4)  I want you to go to the lab to have your prostate levels and cholesterol  checked.  I will also start aspirin  daily for you. 5)  You should try to get [facility]ie Potters certified so that you can have a colonoscopy. 6)  Come back in 2 weeks for a complete physical exam. 7)  -Dr. ONEIDA.   Prescriptions: ULTRAM  50 MG TABS (TRAMADOL  HCL) One tab by mouth q4h as needed for pain  #60 x 3   Entered and Authorized by:   Debby Petties MD   Signed by:   Debby Petties MD on 11/05/2008   Method used:   Electronically to        [facility] (retail)       [address]       [place], [place]  [address]       Ph: [phone]       Fax: [phone]   RxID:   [id number] ADULT ASPIRIN  EC LOW STRENGTH 81 MG TBEC (ASPIRIN ) One tab by mouth daily  #30 x 3   Entered and Authorized by:   Debby Petties MD   Signed by:   Debby Petties MD on 11/05/2008   Method used:   Electronically to        [facility] (retail)       [address]       [place], [place]  [address]       Ph: [phone]       Fax: [phone]   RxID:   [id number]    Tetanus/Td Vaccine    Vaccine Type: Tdap    Site: left deltoid    Mfr: GlaxoSmithKline    Dose: 0.5 ml    Route: IM    Given by: Letitia Reusing    Exp. Date: [id number]    Lot #: [id number]    VIS given: 07/19/07 version given November 05, 2008.

## 2010-10-02 NOTE — Miscellaneous (Signed)
Summary: rx  Patient needs another rx for viagara.  He never got it filled. His phone number is [phone] and he uses [facility] on [address]. Beryle Lathe  April 01, 2010 4:33 PM Prescriptions: VIAGRA 100 MG TABS (SILDENAFIL CITRATE) One tab 30 mins to an hour before relations.  #2 x 0   Entered and Authorized by:   [name] MD   Signed by:   [name] MD on 04/01/2010   Method used:   Electronically to        [facility] [phone]* (retail)       [place], [place]  [address]       Ph: [phone]       Fax: [phone]   RxIDMU:[id number]   Appended Document: rx LMOVM that "the Rx you requested was sent to [facility] on [address]"

## 2010-10-02 NOTE — Assessment & Plan Note (Signed)
 Summary: FU/KH   Vital Signs:  Patient profile:   55 year old male Weight:      153.9 pounds Temp:     97.1 degrees F oral Pulse rate:   92 / minute BP sitting:   118 / 79  (left arm)  Vitals Entered By: Letitia Reusing (April 23, 2009 10:46 AM) CC: f/u Is Patient Diabetic? No   Primary Care Provider:  Debby Petties MD  CC:  f/u.  History of Present Illness: 45M with anemia, leukopenia, shoulder pain, here for follow up.  Anemia:  Normocytic, anemia workup not conclusive for etiology.  Asymptomatic.  Has not had colonoscopy yet.  Leukopenia:  WBC also low on recent check.  Has had HIV checked in 2006, negative.  No Cough, fevers/chills, SOB, skin infections, thrush.  Shoulder pain:  Right supraspinatus tear per sports med.  Will be going for PT but PT hasn't called him yet for appt.  Shoulder bothering him a lot today, feels that last injection helped a lot for a month.  Will inject subacromial bursa and trigger point at site of retracted supraspinatus today as he is having considerable pain in those areas today.  Preventive care:  Pt is due for colonoscopy.  Pt also c/o lump on back of head, present for years, not bothering him, just curious.  Habits & Providers  Alcohol-Tobacco-Diet     Tobacco Status: never  Allergies: No Known Drug Allergies  Past History:  Past Medical History: Last updated: 04/09/2009 Pyelonephritis - 1971 and 1977 Right supraspinatus tear (2010)  Past Surgical History: Last updated: 10/25/2008 Appendectomy-1966 Left knee tendon repair - 1994  Family History: Last updated: 11/05/2008 Family History Hypertension- mother  Diabetes, HTN - Grandmother  Father died of unknown causes in his 9s  Social History: Last updated: 11/05/2008 Pt lives alone, but has a good friend that he spends time with - [name].  She is also a patient at [facility].  He enjoys playing cards and basketball.  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  Review of Systems       See HPI  Physical Exam  General:  Well-developed,well-nourished,in no acute distress; alert,appropriate and cooperative throughout examination Head:  4cm soft, well defined mass on post lateral left scalp.  Non tender. Lungs:  Normal respiratory effort, chest expands symmetrically. Lungs are clear to auscultation, no crackles or wheezes. Heart:  Normal rate and regular rhythm. S1 and S2 normal without gallop, murmur, click, rub or other extra sounds. Extremities:  Palpable gap in right supraspinatus fossa.  TTP over end of SS tendon.  Also with pain in shoulder joint, anterior shoulder.  Additional Exam:  Prepped with chlorhexidine , numbed with cold spray, injected with 5cc kenalog/marcaine under acromion from posterolateral approach.  Approx 1-1.5 cc of remaining solution injected into painful trigger point at retracted SS muscle after cleaning and cold spray.   Tolerated well by pt.   Impression & Recommendations:  Problem # 1:  LIPOMA (ICD-214.9) Assessment New Lesion on head appears to be a lipoma.  Not bothering him, options include surgical excision, deoxycholate injection, and liposuction.  He will think about this.  Informed him that this is benign and he doesn't need it removed if i doesn't bother him.  Problem # 2:  LEUKOPENIA, MILD (ICD-288.50) Assessment: New Mild on last CBC.  Last HIV neg in 2006.  Will check another HIV today.  Orders: [facility]- Est  Level 4 (00785)  Problem # 3:  ANEMIA, NORMOCYTIC (  ICD-285.9) Assessment: Unchanged Workup inconclusive.  Will complete workup with B12, G6PD, Hb electrophoresis.  Will also need colonoscopy.  As ferritin low-normal, will place on iron supplementation.  His updated medication list for this problem includes:    Fe Tabs 325 (65 Fe) Mg Tbec (Ferrous sulfate) ..... One tab by mouth bid  Orders: CBC w/Diff-[facility] (14974) HIV-[facility] ([id number]) B12-[facility] ([id number]) Sickle Cell Scr-[facility]  ([id number]) Miscellaneous Lab Charge-[facility] (00000) [facility]- Est  Level 4 (00785) Gastroenterology Referral (GI)  Problem # 4:  BURSITIS, RIGHT SHOULDER (ICD-726.10) Assessment: Deteriorated Painful, injected subacromial bursa today.  Orders: Injection, large joint- [facility] (20610)  Problem # 5:  ROTATOR CUFF TEAR (ICD-727.61) Assessment: Deteriorated SS tear, injected painful trigger point in retracted muscle.  Will be going to PT.  Surgical tenodesis is a possibility for the future.  Orders [facility]- Est  Level 4 (00785) Trigger point injection- [facility] (79446)  Complete Medication List: 1)  Adult Aspirin  Ec Low Strength 81 Mg Tbec (Aspirin ) .... One tab by mouth daily 2)  Ultram  50 Mg Tabs (Tramadol  hcl) .... One tab by mouth q4h as needed for pain 3)  Mobic  15 Mg Tabs (Meloxicam ) .... One tab by mouth qday with food 4)  Amitriptyline Hcl 50 Mg Tabs (Amitriptyline hcl) .SABRA.. 1 by mouth qhs 5)  Fe Tabs 325 (65 Fe) Mg Tbec (Ferrous sulfate) .... One tab by mouth bid  Patient Instructions: 1)  Good to see you, 2)  We are checking your B12, Hb electrophoresis, G6PD levels. HIV. 3)  I will set you up with GI referral for colonoscopy. 4)  I will inject your shoulder muscle. 5)  Come back to see me in 3 months.  I will call you sooner if your labs are abnormal. 6)  -Dr. ONEIDA. Prescriptions: FE TABS 325 (65 FE) MG TBEC (FERROUS SULFATE) One tab by mouth BID  #60 x 6   Entered and Authorized by:   Debby Petties MD   Signed by:   Debby Petties MD on 04/23/2009   Method used:   Electronically to        [facility] (retail)       [address]       [place], [place]  [address]       Ph: [phone]       Fax: [phone]   RxID:   [id number]    Prevention & Chronic Care Immunizations   Influenza vaccine: Not documented    Tetanus booster: 11/05/2008: Tdap   Tetanus booster due: 11/06/2018    Pneumococcal vaccine: Not documented  Colorectal  Screening   Hemoccult: normal  (11/28/2008)   Hemoccult due: 11/28/2009    Colonoscopy: Not documented  Other Screening   PSA: 0.75  (11/05/2008)   PSA due due: 11/05/2009   Smoking status: never  (04/23/2009)  Lipids   Total Cholesterol: 184  (11/05/2008)   LDL: 106  (11/05/2008)   LDL Direct: Not documented   HDL: 49  (11/05/2008)   Triglycerides: 144  (11/05/2008)    Prevention & Chronic Care Immunizations   Influenza vaccine: Not documented    Tetanus booster: 11/05/2008: Tdap   Tetanus booster due: 11/06/2018    Pneumococcal vaccine: Not documented  Colorectal Screening   Hemoccult: normal  (11/28/2008)   Hemoccult due: 11/28/2009    Colonoscopy: Not documented  Other Screening   PSA: 0.75  (11/05/2008)   PSA due due: 11/05/2009   Smoking status: never  (  04/23/2009)  Lipids   Total Cholesterol: 184  (11/05/2008)   LDL: 106  (11/05/2008)   LDL Direct: Not documented   HDL: 49  (11/05/2008)   Triglycerides: 144  (11/05/2008)

## 2010-10-02 NOTE — Assessment & Plan Note (Signed)
Summary: F/U [facility]   Vital Signs:  Patient profile:   55 year old male BP sitting:   120 / 83  Vitals Entered By: April Manson CMA (February 19, 2010 2:18 PM)  Primary Provider:  [name] MD   History of Present Illness: Reports to f/u right SST tear which is healing. Doing very well. Only occasional pain on overhead activities. Took only 1 tramadol tablet over the past month. s/p SA corticosteroid injection during LOV. Notable decreased shoulder pain in the interim.  No neck pain or UE paresthesias.  Allergies: 1)  ! Codeine  Physical Exam  General:  Well-developed,well-nourished,in no acute distress; alert,appropriate and cooperative throughout examination Msk:  Unchanged from last examination. Still decreased right trap definition.   Impression & Recommendations:  Problem # 1:  ROTATOR CUFF TEAR (ICD-727.61) Assessment Improved  - Continue current mgmt. - Okay to do bicep curls, tricep push-downs, and shoulder shrugs as tolerated. Avoid use of excessive weights. - Avoid strenuous/explosive overhead lifting activities. Avoid push-ups and bench presses for now. - RTC in 2 months, sooner as needed for any other concerns.  Complete Medication List: 1)  Adult Aspirin Ec Low Strength 81 Mg Tbec (Aspirin) .... One tab by mouth daily 2)  Ultram 50 Mg Tabs (Tramadol hcl) .... One tab by mouth q4h as needed for pain 3)  Mobic 15 Mg Tabs (Meloxicam) .... One tab by mouth qday with food 4)  Amitriptyline Hcl 50 Mg Tabs (Amitriptyline hcl) .Marland Kitchen.. 1 by mouth qhs 5)  Fe Tabs 325 (65 Fe) Mg Tbec (Ferrous sulfate) .... One tab by mouth bid 6)  Genteal Mild 0.2 % Soln (Hypromellose) .... 2 drops in each eye as needed for burning sensation. 7)  Lamisil 250 Mg Tabs (Terbinafine hcl) .... One tab by mouth daily x 12 weeks.

## 2010-10-02 NOTE — Assessment & Plan Note (Signed)
 Summary: eye problems,df   Vital Signs:  Patient profile:   55 year old [name] Height:      70 inches Weight:      159.9 pounds BMI:     23.03 Temp:     97.3 degrees F oral Pulse rate:   91 / minute BP sitting:   113 / 76  (left arm)  Vitals Entered By: Olam Keeling (July 11, 2009 3:18 PM) CC: Both eyes burning and stinging Is Patient Diabetic? No Pain Assessment Patient in pain? yes     Location: shoulder Intensity: 4 Type: sharp   Primary Care Provider:  Debby Petties MD  CC:  Both eyes burning and stinging.  History of Present Illness: 89y M with burning of both eyes.  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  Nothing makes it worse, hasn't tried anything to make it better.  No radiation.  Even in both eyes.  No sick contacts.  No dry mouth, no pain or swelling in cheeks/parotids.  Habits & Providers  Alcohol-Tobacco-Diet     Tobacco Status: never  Allergies: 1)  ! Codeine  Past History:  Past Medical History: Last updated: 04/09/2009 Pyelonephritis - 1971 and 1977 Right supraspinatus tear (2010)  Past Surgical History: Last updated: 10/25/2008 Appendectomy-1966 Left knee tendon repair - 1994  Family History: Last updated: 11/05/2008 Family History Hypertension- mother  Diabetes, HTN - Grandmother  Father died of unknown causes in his 61s  Social History: Last updated: 11/05/2008 Pt lives alone, but has a good friend that he spends time with - David [name].  She is also a patient at [facility].  He enjoys playing cards and basketball.  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  Review of Systems       See HPI  Physical Exam  General:  Well-developed,well-nourished,in no acute distress;  alert,appropriate and cooperative throughout examination Head:  Normocephalic and atraumatic without obvious abnormalities.  Eyes:  No corneal or conjunctival inflammation noted. EOMI. Perrla. Funduscopic exam benign, without hemorrhages, exudates or papilledema. Vision grossly normal. Flourescein stain exam reveals no corneal ulcerations. Visual acuity 20/30 in both eyes. Nose:  External nasal examination shows no deformity or inflammation. Nasal mucosa are pink and moist without lesions or exudates. Mouth:  Oral mucosa and oropharynx without lesions or exudates.  Teeth in good repair. Neck:  No deformities, masses, or tenderness noted. Extremities:  Toenails thickened, yellowish black.   Impression & Recommendations:  Problem # 1:  XEROPHTHALMIA (ICD-372.53) Assessment New Allergic conjunctivitis vs primary xerophthalmia.  Less likely allergic without other allergic symptoms.  Will try Genteal Eye Drops.  With 20/30 acuity will also refer to ophthalmology.  Orders: [facility]- Est Level  3 (00786) Ophthalmology Referral (Ophthalmology)  Problem # 2:  ONYCHOMYCOSIS, TOENAILS (ICD-110.1) Assessment: New Check CMET, start lamisil 250 once daily x David weeks.  Orders: [facility]- Est Level  3 (00786) Ophthalmology Referral (Ophthalmology)  His updated medication list for this problem includes:    Lamisil 250 Mg Tabs (Terbinafine hcl) ..... One tab by mouth daily x David weeks.  Complete Medication List: 1)  Adult Aspirin  Ec Low Strength 81 Mg Tbec (Aspirin ) .... One tab by mouth daily 2)  Ultram  50 Mg Tabs (Tramadol  hcl) .... One tab by mouth q4h  as needed for pain 3)  Mobic  15 Mg Tabs (Meloxicam ) .... One tab by mouth qday with food 4)  Amitriptyline Hcl 50 Mg Tabs (Amitriptyline hcl) .SABRA.. 1 by mouth qhs 5)  Fe Tabs 325 (65 Fe) Mg Tbec (Ferrous sulfate) .... One tab by mouth bid 6)  Genteal Mild 0.2 % Soln (Hypromellose) .... 2 drops in each eye as needed for burning sensation. 7)  Lamisil 250 Mg  Tabs (Terbinafine hcl) .... One tab by mouth daily x David weeks.  Other Orders: Comp Met-[facility] [phone])  Patient Instructions: 1)  Go to the lab,  2)  Start using the eye drops. 3)  Ophthalmology appt. 4)  Lamisil daily x David weeks for your toenail fungus. 5)  Come back to see me in 3 months. 6)  -Dr. ONEIDA. Prescriptions: LAMISIL 250 MG TABS (TERBINAFINE HCL) One tab by mouth daily x David weeks.  #84 x 0   Entered and Authorized by:   Debby Petties MD   Signed by:   Debby Petties MD on 07/11/2009   Method used:   Print then Give to Patient   RxID:   [id number] GENTEAL MILD 0.2 % SOLN (HYPROMELLOSE) 2 drops in each eye as needed for burning sensation.  #1 bottle x 6   Entered and Authorized by:   Debby Petties MD   Signed by:   Debby Petties MD on 07/11/2009   Method used:   Print then Give to Patient   RxID:   [id number]

## 2010-10-02 NOTE — Miscellaneous (Signed)
Summary: Procedure consent  Procedure consent   Imported By: Audie Clear 03/11/2010 15:08:14  _____________________________________________________________________  External Attachment:    Type:   Image     Comment:   External Document

## 2010-10-02 NOTE — Assessment & Plan Note (Signed)
Summary: SHOULDER/MJD   Vital Signs:  Patient profile:   55 year old male BP sitting:   128 / 79  Vitals Entered By: April Manson CMA (Jan 08, 2010 11:07 AM)  Primary Provider:  [name] MD   History of Present Illness: Reports to f/u right shoulder pain. Performing home theraband exercises almost daily without difficulty. Completed formal physical therapy. Taking tramadol and amitryptiline as prescribed. No nighttime shoulder pain. No UE weakness or paresthesias. Pain mostly on Hawkin's-type motions; relieved by position change. Received right SA corticosteroid injection  ~1 yr ago with significant pain relief.  Currently unemployed. 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  Allergies: 1)  ! Codeine  Physical Exam  General:  Well-developed,well-nourished,in no acute distress; alert,appropriate and cooperative throughout examination Msk:  NECK: FROM without pain.  SHOULDERS: Decreased prominence of retraction slip on right noted along supraspinatus distribution. Full ROM. Full strength throughout ROM testing. (+) right Hawkin's. Less pain on right Empty Can testing. Additional Exam:  Musculoskeletal Ultrasound: Longitudinal and transverse views of the shoulder revealed the following: Biceps Tendon: Intact. AC Joint: Mild DJD. Subscapularis: Normal. No sub-coracoid impingement. Supraspinatus: Notable decreased prominence of hypoechoic defect vs. previous exam. No sub-acromial impingement. Minimal bursitis. Infraspinatus: Normal. Teres Minor: Normal. Deltoid: Normal. Humeral Head: Slight decfect at site of supraspinatus attachment consistent with old injury.    Impression & Recommendations:  Problem # 1:  ROTATOR CUFF TEAR (ICD-727.61)  Tear is defintiely healing much betteer appearance if SST on [place] note there is a small bony avulsion at attachment  OK to use CSI  cleanse with alcohol Topical analgesic spray : Ethyl  choride Joint: RT shoulder Approached in typical fashion with:post window Completed without difficulty Meds:4 lidocaine + 2 kenalog 40 Needle:25 G and 1.5 in Aftercare instructions and Red flags advised   -  Orders: US EXTREMITY NON-VASC REAL-TIME IMG ID:[id number])  Problem # 2:  BURSITIS, RIGHT SHOULDER (ICD-726.10) main change now seems to be bursal swelling  no sign of repeat tear  will reck 4 wks post injection  Complete Medication List: 1)  Adult Aspirin Ec Low Strength 81 Mg Tbec (Aspirin) .... One tab by mouth daily 2)  Ultram 50 Mg Tabs (Tramadol hcl) .... One tab by mouth q4h as needed for pain 3)  Mobic 15 Mg Tabs (Meloxicam) .... One tab by mouth qday with food 4)  Amitriptyline Hcl 50 Mg Tabs (Amitriptyline hcl) .Marland Kitchen.. 1 by mouth qhs 5)  Fe Tabs 325 (65 Fe) Mg Tbec (Ferrous sulfate) .... One tab by mouth bid 6)  Genteal Mild 0.2 % Soln (Hypromellose) .... 2 drops in each eye as needed for burning sensation. 7)  Lamisil 250 Mg Tabs (Terbinafine hcl) .... One tab by mouth daily x 12 weeks.  Other Orders: Joint Aspirate / Injection, Large [id number]) Kenalog 10 mg inj ([id number])

## 2010-10-02 NOTE — Letter (Signed)
Summary: Handout Printed  Printed Handout:  - Lipoma (Fatty Tumor) 

## 2010-10-02 NOTE — Progress Notes (Signed)
Summary: phn msg  Phone Note Call from Patient Call back at [phone] or [phone]   Summary of Call: Would like to talk to Dr Dianah Field about some personal issues. Initial call taken by: Raymond Gurney,  February 06, 2010 4:52 PM  Follow-up for Phone Call        Have him come in to discuss face to face. Follow-up by: [name] MD,  February 06, 2010 6:55 PM  Additional Follow-up for Phone Call Additional follow up Details #1::        LVM for pt to make appt. per Dr. Landry Corporal note. Additional Follow-up by: Raymond Gurney,  February 11, 2010 10:12 AM

## 2010-10-02 NOTE — Miscellaneous (Signed)
  Clinical Lists Changes  Problems: Removed problem of ARTHRITIS, LEFT WRIST (ICD-716.93) Removed problem of ONYCHOMYCOSIS, TOENAILS (ICD-110.1) Removed problem of XEROPHTHALMIA (ICD-372.53) Removed problem of SHOULDER PAIN, LEFT (ICD-719.41) Removed problem of LIPOMA (ICD-214.9) Removed problem of DIZZINESS (ICD-780.4) Removed problem of SYSTOLIC MURMUR (99991111.2) Removed problem of BURSITIS, RIGHT SHOULDER (ICD-726.10) Removed problem of SPECIAL SCREENING MALIGNANT NEOPLASM OF PROSTATE (ICD-V76.44)

## 2010-10-02 NOTE — Assessment & Plan Note (Signed)
 Summary: dizzy x 1 week/David [name]   Vital Signs:  Patient profile:   55 year old male Height:      70 inches Weight:      156 pounds BMI:     22.46 BSA:     1.88 Temp:     98.1 degrees F Pulse rate:   90 / minute Pulse (ortho):   90 / minute BP sitting:   136 / 88 BP standing:   125 / 86  Vitals Entered By: David [name] CMA (February 07, 2009 3:19 PM)  Serial Vital Signs/Assessments:  Time      Position  BP       Pulse  Resp  Temp     By 3:57 PM   Lying LA  128/80   81                    David Benton LPN 6:42 PM   Sitting   126/83   88                    David Benton LPN 6:42 PM   Standing  125/86   90                    David Benton LPN  CC: Dizzy x 1 week Pain Assessment Patient in pain? no        Primary Care Provider:  Debby Petties MD  CC:  Dizzy x 1 week.  History of Present Illness: 89M with supraspinatus tear and no other PMHx with 1 week dizziness.  Describes it as both spinning room and almost passing out feeling.  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  Has never actually passed out.  Drinks plenty of fluids, normal and unchanged urine output, no CP, no SOB, no diaphoresis.  Usually works in the morning before it gets hot.  Does not occur with turning of head, No weakness, numbness, tingling, dysarthria.  This has never happened before.  When he does get the symptoms, they resolve spontaneously in a matter of seconds, no association with rest.    Habits & Providers  Alcohol-Tobacco-Diet     Tobacco Status: never  Allergies: No Known Drug Allergies  Past History:  Past Medical History: Last updated: 11/05/2008 Pyelonephritis - 1971 and 1977  Past Surgical History: Last updated: 10/25/2008 Appendectomy-1966 Left knee tendon repair - 1994  Family History: Last updated: 11/05/2008 Family History Hypertension- mother  Diabetes, HTN - Grandmother  Father died of unknown causes  in his 6s  Social History: Last updated: 11/05/2008 Pt lives alone, but has a good friend that he spends time with - David [name].  She is also a patient at [facility].  He enjoys playing cards and basketball.  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  Review of Systems  The patient denies fever, weight loss, weight gain, vision loss, decreased hearing, chest pain, syncope, dyspnea on exertion, peripheral edema, headaches, melena, hematochezia, muscle weakness, transient blindness, and difficulty walking.    Physical Exam  General:  Well-developed,well-nourished,in no acute distress; alert,appropriate and cooperative throughout examination Head:  Normocephalic and atraumatic without obvious abnormalities.  Eyes:  No corneal or conjunctival inflammation noted. EOMI. Perrla. Mouth:  Oral mucosa and oropharynx without lesions or exudates.  Moist. Neck:  No deformities, masses, or tenderness noted. Lungs:  Normal respiratory effort, chest expands symmetrically. Lungs are clear to auscultation,  no crackles or wheezes. Heart:  Normal rate and regular rhythm. S1 and S2 normal without gallop, click, rub or other extra sounds. 3/6 systolic murmur (not heard before) auscultated in aortic area and at cardiac apex.  No radiation.  No change with valsalva or change to standing position.  Pulses 2+ througout. Abdomen:  Bowel sounds positive,abdomen soft and non-tender without masses, organomegaly or hernias noted. Extremities:  No clubbing, cyanosis, edema, or deformity noted. Neurologic:  Dix-Hallpike negative. Additional Exam:  EKG: NSR, no ST changes.   Impression & Recommendations:  Problem # 1:  SYSTOLIC MURMUR (PRI-214.2) Assessment New Systolic murmur with dizziness is concerning for aortic stenosis.  Want to check 2D ECHO however patient tells me he cannot afford this, it will have to wait until he is [employer] certified.  Will check CBC, BMET to r/o anemia, electrolyte  abnormalities, renal function.  Pt to avoid sports and strenuous activity for now. EKG WNL.  Orders: Basic Met-[facility] (19951-77089) CBC-[facility] (14972) [facility]- Est  Level 4 (99214) EKG- [facility] (EKG) 2 D Echo (2 D Echo)  Problem # 2:  DIZZINESS (ICD-780.4) Assessment: New Dehydration vs valvular abnormality vs anemia.  See problem #1 for workup.  Doubt vertigo or other inner ear problem without hearing changes or precipitation with rotational changes.  Orders: Basic Met-[facility] (19951-77089) CBC-[facility] (14972) [facility]- Est  Level 4 (99214) EKG- [facility] (EKG) 2 D Echo (2 D Echo)  Complete Medication List: 1)  Adult Aspirin  Ec Low Strength 81 Mg Tbec (Aspirin ) .... One tab by mouth daily 2)  Ultram  50 Mg Tabs (Tramadol  hcl) .... One tab by mouth q4h as needed for pain 3)  Mobic  15 Mg Tabs (Meloxicam ) .... One tab by mouth qday with food  Patient Instructions: 1)  Good to see you today, 2)  Though your dizziness could be attributable to dehydration, I want to run some tests to help me determine the cause.   3)  Orthostatic vital signs, EKG 4)  2D Echocardiogram 5)  CBC, Bmet (Blood tests) 6)  Drink  ~8 glasses of water or gatorade each day. 7)  Call 911 if you have any chest pain or pass out. 8)  Come back to see me in 2 weeks. OK to double book appointments. 9)  Continue to work on getting [employer] certified. 10)  -Dr. ONEIDA.

## 2010-10-02 NOTE — Assessment & Plan Note (Signed)
Summary: follow up shoulder, ok to sched per dr.t/tlb   Vital Signs:  Patient profile:   55 year old male Height:      70 inches Weight:      161.3 pounds BMI:     23.23 Temp:     98.1 degrees F oral Pulse rate:   89 / minute BP sitting:   142 / 82  (left arm) Cuff size:   regular  Vitals Entered By: Levert Feinstein LPN (October 18, 624THL 11:04 AM) CC: f/u shoulder pain Is Patient Diabetic? No Pain Assessment Patient in pain? yes     Location: right shoulder Intensity: 6   Primary Care Provider:  [name] MD  CC:  f/u shoulder pain.  History of Present Illness: 55 yo M with hx of rotator cuff tear on R confirmed on [facility] ultrasound, approx 18-19 months ago, with continued pain and decreased function of R shoulder.   Pt has been seen multiple times at the sports medicine center, has had multiple injections, has been appropriately doing theraband rehab exercises. He also has continued taking Tramadol.  Unfortunately, his pain has remained unchanged.  Recently, he has had trouble working because many of the jobs he finds require lifting and working with his arms above his head.  He works as a Curator.  He continues and significantly altered mechanics, and difficulty abducting his shoulder. He is able to do this with some compensation, but his shoulder function is notably diminished visually.  Habits & Providers  Alcohol-Tobacco-Diet     Tobacco Status: never  Current Medications (verified): 1)  Adult Aspirin Ec Low Strength 81 Mg Tbec (Aspirin) .... One Tab By Mouth Daily 2)  Vicodin 5-500 Mg Tabs (Hydrocodone-Acetaminophen) .... One Tab By Mouth Q4-6h As Needed For Pain. 3)  Mobic 15 Mg Tabs (Meloxicam) .... One Tab By Mouth Qday With Food 4)  Amitriptyline Hcl 50 Mg Tabs (Amitriptyline Hcl) .Marland Kitchen.. 1 By Mouth Qhs 5)  Fe Tabs 325 (65 Fe) Mg Tbec (Ferrous Sulfate) .... One Tab By Mouth Bid 6)  Genteal Mild 0.2 % Soln (Hypromellose) .... 2 Drops in Each Eye As Needed For  Burning Sensation. 7)  Viagra 100 Mg Tabs (Sildenafil Citrate) .... One Tab 30 Mins To An Hour Before Relations.  Allergies (verified): 1)  ! Codeine  Past History:  Past Medical History: Last updated: 03/06/2010 Pyelonephritis - 1971 and 1977 Right supraspinatus tear (2010) Left radiocarpal arthritis  Past Surgical History: Last updated: 10/25/2008 Appendectomy-1966 Left knee tendon repair - 1994  Family History: Last updated: 11/05/2008 Family History Hypertension- mother  Diabetes, HTN - Grandmother  Father died of unknown causes in his 80s  Social History: Last updated: 11/05/2008 Pt lives alone, but has a good friend that he spends time with - Aviva Kluver.  She is also a patient at [facility].  He enjoys playing cards and basketball.  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  Review of Systems       See HPI  Physical Exam  General:  Well-developed,well-nourished,in no acute distress; alert,appropriate and cooperative throughout examination Msk:  Shoulder: Right shoulder with visible muscle wasting, particularly of trapezius muscle - quite dramatic. Patient holds L shoulder higher than R shoulder. prominent subacromial bursa, visibly, with internal rotation.  Tenderness to palpation over biceps tendon. Patient also has some small areas of swelling vs muscle spasm over trapezius on R.  notably altered shoulder function and range of motion on the right with elevation of the right hemi  scapula and pain with arc of motion and notable scapular dyskinesis and elevation of that scapula. Internal and external rotation is intact.  4/5 strength with empty can on R, 5/5 on L. 5/5 deltoid bilaterally. 5/5 flexion/extension at elbow bilaterally.  drop sign causes pain, but is not negative.  Positive Neer, positive Michel Bickers test. Minimally positive crossover test.   Impression & Recommendations:  Problem # 1:  ROTATOR CUFF TEAR (ICD-727.61) Assessment  Deteriorated I will proceed with referral to orthopaedics. We have maximized medical/non-op mgt for this patient. [employer] is prohibitive and I will try a referral to [facility] orthopaedics. He will likely need arthroscopic debridement with anchoring of the retracted supraspinatus back on the humerus.  Orders: [place]- Est Level  3 [id number]) Orthopedic Surgeon Referral (Ortho Surgeon)  Complete Medication List: 1)  Adult Aspirin Ec Low Strength 81 Mg Tbec (Aspirin) .... One tab by mouth daily 2)  Vicodin 5-500 Mg Tabs (Hydrocodone-acetaminophen) .... One tab by mouth q4-6h as needed for pain. 3)  Mobic 15 Mg Tabs (Meloxicam) .... One tab by mouth qday with food 4)  Amitriptyline Hcl 50 Mg Tabs (Amitriptyline hcl) .Marland Kitchen.. 1 by mouth qhs 5)  Fe Tabs 325 (65 Fe) Mg Tbec (Ferrous sulfate) .... One tab by mouth bid 6)  Genteal Mild 0.2 % Soln (Hypromellose) .... 2 drops in each eye as needed for burning sensation. 7)  Viagra 100 Mg Tabs (Sildenafil citrate) .... One tab 30 mins to an hour before relations.   Orders Added: 1)  [place]- Est Level  3 [id number] 2)  Orthopedic Surgeon Referral [Ortho Surgeon]  Appended Document: follow up shoulder, ok to sched per dr.t/tlb    BP rechecked manually and improved. [name] MD  June 17, 2010 12:07 PM  Observations: Added new observation of BP DIASTOLIC: 78 mmHg (123456 12:06) Added new observation of BP SYSTOLIC: 123456 mmHg (123456 12:06)

## 2010-10-21 HISTORY — PX: SHOULDER ARTHROSCOPY W/ ROTATOR CUFF REPAIR: SHX2400

## 2010-11-18 ENCOUNTER — Encounter: Payer: Self-pay | Admitting: Sports Medicine

## 2010-11-18 ENCOUNTER — Ambulatory Visit (INDEPENDENT_AMBULATORY_CARE_PROVIDER_SITE_OTHER): Payer: Self-pay | Admitting: Sports Medicine

## 2010-11-18 DIAGNOSIS — M75120 Complete rotator cuff tear or rupture of unspecified shoulder, not specified as traumatic: Secondary | ICD-10-CM

## 2010-11-18 DIAGNOSIS — R3911 Hesitancy of micturition: Secondary | ICD-10-CM

## 2010-11-18 LAB — POCT UA - MICROSCOPIC ONLY

## 2010-11-18 LAB — POCT URINALYSIS DIPSTICK
Blood, UA: NEGATIVE
Protein, UA: 30
Spec Grav, UA: >=1.03
Urobilinogen, UA: 0.2

## 2010-11-18 MED ORDER — CIPROFLOXACIN HCL 500 MG PO TABS: 500.0000 mg | ORAL_TABLET | Freq: Two times a day (BID) | ORAL | Status: AC

## 2010-11-18 MED ORDER — DOXAZOSIN MESYLATE 2 MG PO TABS: ORAL_TABLET | ORAL | Status: DC

## 2010-11-18 MED ORDER — HYDROCODONE-ACETAMINOPHEN 10-500 MG PO TABS: 1.0000 | ORAL_TABLET | Freq: Four times a day (QID) | ORAL | Status: DC | PRN

## 2010-11-18 NOTE — Progress Notes (Signed)
  Subjective:    Patient ID: David [name], male    DOB: [birth year], 55 y.o.   MRN: [id number]  HPI R shoulder:  Has been treated by [place] for torn SST, maximized medical therapy, finally had surgery at [facility] 10/21/10.  Still in sling and with pain.  Vicodin 5/500 not helping.  Penile discharge:  Yellowish, thick, some burning with voiding.  Obstructive symptoms:  Hesitancy, dribbling, wakes up 2x a night to void.  No back pain, wt loss, fevers/chills.   Review of Systems    See HPI Objective:   Physical Exam  Constitutional: He appears well-developed and well-nourished.  Cardiovascular: Normal rate, regular rhythm, normal heart sounds and intact distal pulses.  Exam reveals no gallop and no friction rub.   No murmur heard. Pulmonary/Chest: Effort normal and breath sounds normal. No respiratory distress. He has no wheezes. He has no rales. He exhibits no tenderness.  Abdominal: Soft. Bowel sounds are normal. He exhibits no distension and no mass. There is no tenderness. There is no rebound and no guarding.  Genitourinary: Testes normal and penis normal. Rectal exam shows no external hemorrhoid, no fissure, no mass, no tenderness and anal tone normal. Prostate is not enlarged and not tender. Circumcised. No phimosis, paraphimosis, hypospadias, penile erythema or penile tenderness. No discharge found.          Assessment & Plan:

## 2010-11-18 NOTE — Assessment & Plan Note (Signed)
Obstructive symptoms with penile discharge. Checking Urine GC/Chlam. UA, UCx. Will go ahead and treat for prostatitis w/cipro. Add flomax. RTC 1-2 wk to discuss symptoms.

## 2010-11-18 NOTE — Patient Instructions (Signed)
Great to see you, Increased your pain medication for your shoulder. Checking some labs for your discharge. Cipro for 21 days. Use the doxazosin at bedtime, it should help your urinary symptoms.  You can increase to 2 tabs if the results are improved enough in a week. Come back to see me in ~3 weeks to see how you are doing.  -Dr. Darene Lamer. Benign Prostatic Hypertrophy   (Benign Prostatic Hyperplasia, Enlarged Prostate) The prostate gland is part of the reproductive system of men. A normal prostate is about the size and shape of a walnut. The prostate gland makes a fluid that is mixed with sperm to make semen. This gland surrounds the urethra and is located in front of the rectum and just below the bladder. The bladder is where urine is stored. The urethra is the tube through which urine passes from the bladder to get out of the body. The prostate grows as a man ages. An enlarged prostate not caused by cancer is called Benign Prostatic Hypertrophy (BPH). This is a common health problem in men over age 52. This condition is a normal part of aging. An enlarged prostate presses on the urethra. This makes it harder to pass urine. In the early stages of enlargement, the bladder can get by with a narrowed urethra by forcing the urine through. If the problem gets worse, medical or surgical treatment may be required.   This condition should be followed by your caregiver. Longstanding back pressure on the kidneys can cause infection. Back pressure and infection can progress to bladder damage and kidney (renal) failure. If needed, your caregiver may refer you to a specialist in kidney and prostate disease (urologist). CAUSES The exact cause is not known.   SYMPTOMS  You are not able to completely empty your bladder.   Getting up often during the night to urinate.   Need to urinate frequently during the day.   Difficultly in starting urine flow.   Decrease in size and strength of the urine stream.    Dribbling after urination.   Pain on urination (more common with infection).   Inability to pass your water. This needs immediate treatment.  DIAGNOSIS These tests will help your caregiver understand your problem:  Digital rectal exam (DRE) - In a rectal exam, your caregiver checks your prostate by putting a gloved, lubricated finger into the rectum to feel the back of your prostate gland. This exam detects the size of the gland and abnormal lumps or growths.   Urinalysis - Exam of the urine. This may include a culture if there is concern about infection.   Prostate Specific Antigen (PSA) - This is a helpful blood test used to screen for prostate cancer. It is not used alone for diagnosing prostate cancer.   Rectal ultrasound (sonogram) - This test uses sound waves to electronically produce a "picture" of the prostate. It helps examine the prostate gland for cancer.  TREATMENT Mild symptoms may not need treatment. Simple observation and yearly exams may be all that is required. Medications and surgery are options for more severe problems. Your caregiver can help you make an informed decision for what is best. Two classes of medications are available for relief of prostate symptoms:  There are medications which can be used to shrink the prostate. This helps relieve symptoms.   Uncommon side effects include problems with sexual function.   There are also medications to relax the muscle of the prostate. This also relieves the obstruction.   Side  effects can include dizziness, fatigue, lightheadedness, and retrograde ejaculation (diminished volume of ejaculate).  Several types of surgical treatments are available for relief of prostate symptoms:  Transurethral resection of the prostate (TURP). In this treatment, an instrument is inserted through opening at the tip of the penis. It is used to cut away pieces of the inner core of the prostate. The pieces are removed through the same opening  of the penis. This removes the obstruction and helps get rid of the symptoms.   Transurethral incision (TUIP). In this procedure, small cuts are made in the prostate. This lessens the prostates pressure on the urethra.   Transurethral microwave thermotherapy (TUMT). This procedure uses microwaves to create heat. The heat destroys and removes a small amount of prostate tissue.   Transurethral needle ablation (TUNA). This is a procedure that uses radio frequencies to do the same as TUMT.   Interstitial Laser Coagulation ([place]). This is a procedure that uses a laser to do the same as TUMT and TUNA.   Transurethral electrovaporization (TUVP). This is a procedure that uses electrodes to do the same as the procedures listed above.  Regardless of the method of treatment chosen, you and your caregiver will discuss the options. With this knowledge, you along with your caregiver can decide upon the best treatment for you. SEEK MEDICAL CARE IF:  You develop chills, fever of 100.5 F (38.1 C), or night sweats.   There is unexplained back pain.   Symptoms are not helped by medications prescribed.   You develop medication side effects.   Your urine becomes very dark or has a bad smell.  SEEK IMMEDIATE MEDICAL CARE IF:  You are suddenly unable to urinate. This is an emergency. You should be seen immediately.   There are large amounts of blood or clots in the urine.   Your urinary problems become unmanageable.   You develop lightheadedness, severe dizziness, or you feel faint.   You develop moderate to severe low back or flank pain.   You develop chills or fever.  Document Released: 08/17/2005 Document Re-Released: 06/14/2007 [facility] Patient Information 2011 [place].

## 2010-11-18 NOTE — Assessment & Plan Note (Signed)
S/p surgery 10/21/10. Increasing vicodin as pain uncontrolled.

## 2010-11-19 ENCOUNTER — Encounter: Payer: Self-pay | Admitting: Sports Medicine

## 2010-11-19 LAB — GC/CHLAMYDIA PROBE AMP, URINE: Chlamydia, Swab/Urine, PCR: NEGATIVE

## 2010-11-26 ENCOUNTER — Ambulatory Visit: Payer: Self-pay | Admitting: Rehabilitative and Restorative Service Providers"

## 2010-12-15 ENCOUNTER — Encounter: Payer: Self-pay | Admitting: Sports Medicine

## 2010-12-15 ENCOUNTER — Ambulatory Visit (INDEPENDENT_AMBULATORY_CARE_PROVIDER_SITE_OTHER): Payer: Self-pay | Admitting: Sports Medicine

## 2010-12-15 VITALS — BP 113/77 | HR 92 | Wt 163.5 lb

## 2010-12-15 DIAGNOSIS — R3911 Hesitancy of micturition: Secondary | ICD-10-CM

## 2010-12-15 DIAGNOSIS — M75120 Complete rotator cuff tear or rupture of unspecified shoulder, not specified as traumatic: Secondary | ICD-10-CM

## 2010-12-15 MED ORDER — OXYCODONE-ACETAMINOPHEN 5-325 MG PO TABS: 1.0000 | ORAL_TABLET | ORAL | Status: DC | PRN

## 2010-12-15 MED ORDER — SENNA-DOCUSATE SODIUM 8.6-50 MG PO TABS: 1.0000 | ORAL_TABLET | Freq: Two times a day (BID) | ORAL | Status: DC

## 2010-12-15 NOTE — Progress Notes (Signed)
  Subjective:    Patient ID: David [name], male    DOB: [birth year], 55 y.o.   MRN: [id number]  HPI R shoulder:  Increased vicodin to 10/500, not helping, constipated now.  Pt has upcoming MRI with contrast at [facility].  Urinary obstruction:  GC/Chlam, UA/UCx neg.  Finishing course cipro for ?prostatitis.  Symptoms overall better, less leaking, with flomax.  Taking 1 tab qHS.   Review of Systems    See HPI Objective:   Physical Exam  Constitutional: He appears well-developed and well-nourished. No distress.  Skin: Skin is warm and dry.          Assessment & Plan:

## 2010-12-15 NOTE — Patient Instructions (Signed)
Great to see you, See below for new meds. Senokot for  Your constipation, stop the ex-lax. Come back to see me as needed. May go back to 2 of the flomax tabs if this helps.  Gwen Her. Dianah Field, M.D.

## 2010-12-15 NOTE — Assessment & Plan Note (Signed)
Finishing cipro for ? Prostatitis. UCx, GC/Chlam neg. Cont flomax, symptoms improved some. May double flomax as needed.

## 2010-12-15 NOTE — Assessment & Plan Note (Signed)
Still with sig pain. Vicodin 10/500 not helping, constipated now. Has MRI with contrast coming up. Will increase to percocet 5/325. Senokot-S for constipation. RTC prn.

## 2010-12-31 ENCOUNTER — Telehealth: Payer: Self-pay | Admitting: Sports Medicine

## 2010-12-31 ENCOUNTER — Other Ambulatory Visit: Payer: Self-pay | Admitting: Sports Medicine

## 2010-12-31 DIAGNOSIS — M75120 Complete rotator cuff tear or rupture of unspecified shoulder, not specified as traumatic: Secondary | ICD-10-CM

## 2010-12-31 MED ORDER — OXYCODONE-ACETAMINOPHEN 5-325 MG PO TABS: 1.0000 | ORAL_TABLET | ORAL | Status: DC | PRN

## 2010-12-31 NOTE — Telephone Encounter (Signed)
In env at front.

## 2010-12-31 NOTE — Telephone Encounter (Signed)
Needs refill on Percocet - please call when ready

## 2011-01-13 ENCOUNTER — Ambulatory Visit: Payer: Self-pay | Attending: Sports Medicine | Admitting: Rehabilitative and Restorative Service Providers"

## 2011-01-13 DIAGNOSIS — IMO0001 Reserved for inherently not codable concepts without codable children: Secondary | ICD-10-CM | POA: Insufficient documentation

## 2011-01-13 DIAGNOSIS — M25519 Pain in unspecified shoulder: Secondary | ICD-10-CM | POA: Insufficient documentation

## 2011-01-13 DIAGNOSIS — R293 Abnormal posture: Secondary | ICD-10-CM | POA: Insufficient documentation

## 2011-01-13 DIAGNOSIS — M2560 Stiffness of unspecified joint, not elsewhere classified: Secondary | ICD-10-CM | POA: Insufficient documentation

## 2011-01-15 ENCOUNTER — Ambulatory Visit: Payer: Self-pay | Admitting: Physical Therapy

## 2011-01-21 ENCOUNTER — Ambulatory Visit: Payer: Self-pay

## 2011-01-22 ENCOUNTER — Encounter: Payer: Self-pay | Admitting: Rehabilitative and Restorative Service Providers"

## 2011-01-28 ENCOUNTER — Ambulatory Visit: Payer: Self-pay | Admitting: Physical Therapy

## 2011-01-30 ENCOUNTER — Ambulatory Visit: Payer: Self-pay | Attending: Sports Medicine | Admitting: Rehabilitative and Restorative Service Providers"

## 2011-01-30 DIAGNOSIS — IMO0001 Reserved for inherently not codable concepts without codable children: Secondary | ICD-10-CM | POA: Insufficient documentation

## 2011-01-30 DIAGNOSIS — M25519 Pain in unspecified shoulder: Secondary | ICD-10-CM | POA: Insufficient documentation

## 2011-01-30 DIAGNOSIS — M2569 Stiffness of other specified joint, not elsewhere classified: Secondary | ICD-10-CM | POA: Insufficient documentation

## 2011-01-30 DIAGNOSIS — R293 Abnormal posture: Secondary | ICD-10-CM | POA: Insufficient documentation

## 2011-02-02 ENCOUNTER — Encounter: Payer: Self-pay | Admitting: Rehabilitative and Restorative Service Providers"

## 2011-02-02 ENCOUNTER — Other Ambulatory Visit: Payer: Self-pay | Admitting: Sports Medicine

## 2011-02-02 NOTE — Telephone Encounter (Signed)
Needs refill on percocet - please call when ready

## 2011-02-02 NOTE — Telephone Encounter (Signed)
Spoke with patient and informed of below. He will call back up front to schedule an appointment for this. He understands that this issue is the only problem being discussed in this visit

## 2011-02-02 NOTE — Telephone Encounter (Signed)
The new practice policy is pts need to come in for a office visit for controlled substances.  I can no longer leave this in an envelope in the front so have him come see me at my next avail slot and I can write a script.  We will not be assessing other problems during this office visit.

## 2011-02-04 ENCOUNTER — Ambulatory Visit: Payer: Self-pay | Admitting: Physical Therapy

## 2011-02-06 ENCOUNTER — Ambulatory Visit: Payer: Self-pay | Admitting: Sports Medicine

## 2011-02-10 ENCOUNTER — Encounter: Payer: Self-pay | Admitting: Sports Medicine

## 2011-02-10 ENCOUNTER — Ambulatory Visit: Payer: Self-pay | Admitting: Rehabilitative and Restorative Service Providers"

## 2011-02-11 ENCOUNTER — Encounter: Payer: Self-pay | Admitting: Sports Medicine

## 2011-02-11 ENCOUNTER — Ambulatory Visit (INDEPENDENT_AMBULATORY_CARE_PROVIDER_SITE_OTHER): Payer: Self-pay | Admitting: Sports Medicine

## 2011-02-11 DIAGNOSIS — M75120 Complete rotator cuff tear or rupture of unspecified shoulder, not specified as traumatic: Secondary | ICD-10-CM

## 2011-02-11 DIAGNOSIS — R3911 Hesitancy of micturition: Secondary | ICD-10-CM

## 2011-02-11 MED ORDER — ASPIRIN 81 MG PO TBDP: 81.0000 mg | ORAL_TABLET | Freq: Every day | ORAL | Status: DC

## 2011-02-11 MED ORDER — SENNA-DOCUSATE SODIUM 8.6-50 MG PO TABS: 1.0000 | ORAL_TABLET | Freq: Two times a day (BID) | ORAL | Status: DC

## 2011-02-11 MED ORDER — DOXAZOSIN MESYLATE 4 MG PO TABS: 4.0000 mg | ORAL_TABLET | Freq: Every day | ORAL | Status: DC

## 2011-02-11 MED ORDER — OXYCODONE-ACETAMINOPHEN 10-325 MG PO TABS: 1.0000 | ORAL_TABLET | Freq: Three times a day (TID) | ORAL | Status: DC | PRN

## 2011-02-11 NOTE — Progress Notes (Signed)
  Subjective:    Patient ID: David [name], male    DOB: 07/11/1956, 55 y.o.   MRN: [id number]  HPI LBP:  Present for several weeks.  Noted when he coughs or sneezes. Pain down R leg, has been having trouble voiding.  Off amitriptyline.  Weakness of thighs.  No saddle anesthesia.  Percocet helps only a little.  R shoulder:  S/P rotator cuff surgery, better, working with PT now.  Urinary hesitancy:  See #1, better with doxazosin, would like to go up to 4 mg.   Review of Systems    See HPI Objective:   Physical Exam  Constitutional: He appears well-developed and well-nourished. No distress.  Cardiovascular: Normal rate, regular rhythm and normal heart sounds.  Exam reveals no gallop and no friction rub.   No murmur heard. Pulmonary/Chest: Effort normal and breath sounds normal. No respiratory distress. He has no wheezes. He has no rales. He exhibits no tenderness.  Skin: Skin is warm and dry.  Back Exam: Inspection: Unremarkable Motion: Flexion 45 deg, Extension 45 deg, Side Bending to 45 deg bilaterally,  Rotation to 45 deg bilaterally SLR laying:  Positive on R Palpable tenderness: R lumbar erector spinate trigger point. FABER: negative Sensory change: Gross sensation intact to all lumbar and sacral dermatomes. Reflexes: 2+ at R patellar tendon (left decreased but prior TKA), 2+ at achilles tendons, Babinski's downgoing.  Strength at foot Plantar-flexion: 5/5    Dorsi-flexion: 5/5    Eversion: 5/5   Inversion: 5/5 Leg strength Quad: 5/5   Hamstring: 5/5   Hip flexor: 5/5   Hip abductors: 5/5 Gait antalgic  Consent obtained and verified. Time-out conducted. Noted no overlying erythema, induration, or other signs of local infection. Sterile betadine prep. Furthur cleansed with alcohol. Topical analgesic spray: Ethyl chloride. Joint: R lumbar trigger point. Approached in typical fashion with: 25g needle. Completed without difficulty Meds: 1/2 cc kenalog 40.1 cc  lidocaine. Advised to call if fevers/chills, erythema, induration, drainage, or persistent bleeding.     Assessment & Plan:

## 2011-02-11 NOTE — Assessment & Plan Note (Signed)
Initially thought related to prostatic hypertrophy which it most likely is. With sx of LBP and pain with valsalva ? Herniated disc.  I  ? Whether his hesitancy is related to lumbar process, will MRI to assess CES/Cord Compression. Will MRI lumbar spine. Will increase doxazosin to 4 mg. Percocet to 10mg . RTC after MRI to go over results.

## 2011-02-11 NOTE — Patient Instructions (Signed)
Increased prostate medication. Refilled percocet. Need to check MRI spine. Come back to see [place] in a few months. Will let you know the results of the MRI.  Gwen Her. Dianah Field, M.D. [facility] [address] [place] [address] [phone]

## 2011-02-12 ENCOUNTER — Ambulatory Visit: Payer: Self-pay | Admitting: Rehabilitative and Restorative Service Providers"

## 2011-02-13 ENCOUNTER — Encounter: Payer: Self-pay | Admitting: Sports Medicine

## 2011-02-13 ENCOUNTER — Ambulatory Visit (HOSPITAL_COMMUNITY)
Admission: RE | Admit: 2011-02-13 | Discharge: 2011-02-13 | Disposition: A | Discharge status: Home / Self Care | Payer: Self-pay | Source: Ambulatory Visit | Attending: Family Medicine | Admitting: Family Medicine

## 2011-02-13 DIAGNOSIS — M5137 Other intervertebral disc degeneration, lumbosacral region: Secondary | ICD-10-CM | POA: Insufficient documentation

## 2011-02-13 DIAGNOSIS — M545 Low back pain, unspecified: Secondary | ICD-10-CM | POA: Insufficient documentation

## 2011-02-13 DIAGNOSIS — M51379 Other intervertebral disc degeneration, lumbosacral region without mention of lumbar back pain or lower extremity pain: Secondary | ICD-10-CM | POA: Insufficient documentation

## 2011-02-13 DIAGNOSIS — M5126 Other intervertebral disc displacement, lumbar region: Secondary | ICD-10-CM | POA: Insufficient documentation

## 2011-02-13 DIAGNOSIS — M519 Unspecified thoracic, thoracolumbar and lumbosacral intervertebral disc disorder: Secondary | ICD-10-CM | POA: Insufficient documentation

## 2011-02-13 DIAGNOSIS — M48061 Spinal stenosis, lumbar region without neurogenic claudication: Secondary | ICD-10-CM | POA: Insufficient documentation

## 2011-02-13 DIAGNOSIS — R3911 Hesitancy of micturition: Secondary | ICD-10-CM

## 2011-02-17 ENCOUNTER — Ambulatory Visit: Payer: Self-pay | Admitting: Physical Therapy

## 2011-02-19 ENCOUNTER — Ambulatory Visit: Payer: Self-pay | Admitting: Physical Therapy

## 2011-02-23 ENCOUNTER — Ambulatory Visit: Payer: Self-pay | Admitting: Physical Therapy

## 2011-02-25 ENCOUNTER — Ambulatory Visit: Payer: Self-pay | Admitting: Physical Therapy

## 2011-03-02 ENCOUNTER — Ambulatory Visit: Payer: Self-pay | Attending: Sports Medicine | Admitting: Physical Therapy

## 2011-03-02 DIAGNOSIS — IMO0001 Reserved for inherently not codable concepts without codable children: Secondary | ICD-10-CM | POA: Insufficient documentation

## 2011-03-02 DIAGNOSIS — M2560 Stiffness of unspecified joint, not elsewhere classified: Secondary | ICD-10-CM | POA: Insufficient documentation

## 2011-03-02 DIAGNOSIS — M25519 Pain in unspecified shoulder: Secondary | ICD-10-CM | POA: Insufficient documentation

## 2011-03-02 DIAGNOSIS — R293 Abnormal posture: Secondary | ICD-10-CM | POA: Insufficient documentation

## 2011-03-10 ENCOUNTER — Encounter: Payer: Self-pay | Admitting: Rehabilitative and Restorative Service Providers"

## 2011-03-12 ENCOUNTER — Ambulatory Visit: Payer: Self-pay | Admitting: Rehabilitative and Restorative Service Providers"

## 2011-03-17 ENCOUNTER — Encounter: Payer: Self-pay | Admitting: Physical Therapy

## 2011-03-28 ENCOUNTER — Encounter: Payer: Self-pay | Admitting: Family Medicine

## 2011-05-27 ENCOUNTER — Ambulatory Visit (INDEPENDENT_AMBULATORY_CARE_PROVIDER_SITE_OTHER): Payer: Self-pay | Admitting: Family Medicine

## 2011-05-27 ENCOUNTER — Encounter: Payer: Self-pay | Admitting: Family Medicine

## 2011-05-27 VITALS — BP 145/98 | HR 104 | Temp 99.0°F | Ht 70.0 in | Wt 172.1 lb

## 2011-05-27 DIAGNOSIS — I10 Essential (primary) hypertension: Secondary | ICD-10-CM

## 2011-05-27 DIAGNOSIS — M25511 Pain in right shoulder: Secondary | ICD-10-CM | POA: Insufficient documentation

## 2011-05-27 DIAGNOSIS — M25519 Pain in unspecified shoulder: Secondary | ICD-10-CM

## 2011-05-27 DIAGNOSIS — Z23 Encounter for immunization: Secondary | ICD-10-CM

## 2011-05-27 DIAGNOSIS — M7500 Adhesive capsulitis of unspecified shoulder: Secondary | ICD-10-CM

## 2011-05-27 DIAGNOSIS — R3911 Hesitancy of micturition: Secondary | ICD-10-CM

## 2011-05-27 MED ORDER — OXYCODONE-ACETAMINOPHEN 10-325 MG PO TABS: 1.0000 | ORAL_TABLET | Freq: Three times a day (TID) | ORAL | Status: DC | PRN

## 2011-05-27 MED ORDER — VARDENAFIL HCL 20 MG PO TABS: 20.0000 mg | ORAL_TABLET | ORAL | Status: DC | PRN

## 2011-05-27 NOTE — Assessment & Plan Note (Signed)
Continues to have shoulder pain. He has finished PT, but still has right shoulder ROM issues. Will go back to his orthopedic surgeon on Oct. 31st. MRI on 17th of Oct. Percocet for pain till then.

## 2011-05-27 NOTE — Progress Notes (Signed)
  Subjective:    Patient ID: David [name], male    DOB: 11/04/1955, 55 y.o.   MRN: [id number]  HPI 1. Right shoulder pain Has decreased ROM right shoulder. Has rotator cuff surgery and did 6 weeks of PT. He is still not able to elevate his arm above 90 degrees. He has seen his orthopod and is having an MRI on the 17th and seeing ortho on the 31st.  2. HTN Pressure elevated today. 145/98. Patient does use salt in food. He exercises moderately.  3. Erectile dysfunction Patient is not able to sustain or get an erection as easily as he once did. Is considered about the cost of medication to assist with this.  4. Penile discharge States that he has had penile discharge, this was assessed by his prior PCP. He believes this is sperm. No risk for STD, has already been tested.  Review of Systems  Constitutional: Negative for fever, activity change, appetite change, fatigue and unexpected weight change.  Eyes: Negative for visual disturbance.  Respiratory: Negative for shortness of breath.   Cardiovascular: Negative for chest pain, palpitations and leg swelling.  Gastrointestinal: Negative for abdominal pain.  Genitourinary: Positive for discharge. Negative for dysuria, flank pain, penile swelling and penile pain.  Musculoskeletal: Negative for back pain and arthralgias.       Right shoulder pain   Skin: Negative for rash.  Neurological: Negative for headaches.       Objective:   Physical Exam  Nursing note and vitals reviewed. Constitutional: He appears well-developed and well-nourished. No distress.  HENT:  Head: Normocephalic and atraumatic.  Mouth/Throat: No oropharyngeal exudate.  Cardiovascular: Normal rate, regular rhythm and normal heart sounds.   No murmur heard. Pulmonary/Chest: Effort normal and breath sounds normal. No respiratory distress.  Musculoskeletal:       Right shoulder: He exhibits decreased range of motion, tenderness and pain. He exhibits no swelling and normal  strength.  Skin: He is not diaphoretic.      Assessment & Plan:

## 2011-05-27 NOTE — Assessment & Plan Note (Signed)
Patient states he normally has normal BP. Will recheck next visit and consider starting a medication.

## 2011-05-27 NOTE — Patient Instructions (Addendum)
Please see your orthopedic surgeon again to see what they recommend.

## 2011-09-09 ENCOUNTER — Ambulatory Visit (INDEPENDENT_AMBULATORY_CARE_PROVIDER_SITE_OTHER): Payer: Self-pay | Admitting: Family Medicine

## 2011-09-09 ENCOUNTER — Encounter: Payer: Self-pay | Admitting: Family Medicine

## 2011-09-09 DIAGNOSIS — M25532 Pain in left wrist: Secondary | ICD-10-CM

## 2011-09-09 DIAGNOSIS — M25519 Pain in unspecified shoulder: Secondary | ICD-10-CM

## 2011-09-09 DIAGNOSIS — M25539 Pain in unspecified wrist: Secondary | ICD-10-CM

## 2011-09-09 DIAGNOSIS — N400 Enlarged prostate without lower urinary tract symptoms: Secondary | ICD-10-CM | POA: Insufficient documentation

## 2011-09-09 DIAGNOSIS — M25511 Pain in right shoulder: Secondary | ICD-10-CM

## 2011-09-09 MED ORDER — TAMSULOSIN HCL 0.4 MG PO CAPS: 0.4000 mg | ORAL_CAPSULE | Freq: Every day | ORAL | Status: DC

## 2011-09-09 MED ORDER — OXYCODONE-ACETAMINOPHEN 5-325 MG PO TABS: 1.0000 | ORAL_TABLET | Freq: Three times a day (TID) | ORAL | Status: DC | PRN

## 2011-09-09 NOTE — Assessment & Plan Note (Signed)
Refilled percocet 30 mg. Patient does not use this medication every day. No abuse history.

## 2011-09-09 NOTE — Patient Instructions (Signed)
It was great to see you today!  Schedule an appointment to see me for a complete physical.

## 2011-09-09 NOTE — Assessment & Plan Note (Signed)
Flomax prescribed. Discussed prostate CA risk. Had rectal within year.

## 2011-09-09 NOTE — Progress Notes (Signed)
  Subjective:    Patient ID: David [name], male    DOB: 1955/10/08, 56 y.o.   MRN: [id number]  HPI 1. Urinary frequency, urgency, incomplete emptying. Patient has past history of BPH like symptoms. He has a GC/CL negative within the year. He has one sexual partner does not wear condoms. Says he has discharge with sexual excitement. No swollen glands, no fever, no dysuria, no bloody discharge. No partner with STD. He had STD when he was really young. He has had success with Flomax in the past.   2. Left Wrist pain, Right shoulder pain. Takes percocet sporadically. Diagnosed by previous PCP.    Review of Systems Negative for fevers, chills, night sweats, weight loss, rash.     Objective:   Physical Exam Filed Vitals:   09/09/11 1352  BP: 135/88  Pulse: 94  Temp: 98.4 F (36.9 C)  TempSrc: Oral  Height: 5' 10.75" (1.797 m)  Weight: 169 lb (76.658 kg)  Abdomen: soft and non-tender without masses, organomegaly or hernias noted.  No guarding or rebound Lungs:  Normal respiratory effort, chest expands symmetrically. Lungs are clear to auscultation, no crackles or wheezes. Extremities:  No cyanosis, edema, or deformity noted with good range of motion of all major joints.      Assessment & Plan:

## 2011-10-01 ENCOUNTER — Encounter: Payer: Self-pay | Admitting: Family Medicine

## 2011-10-01 ENCOUNTER — Ambulatory Visit (INDEPENDENT_AMBULATORY_CARE_PROVIDER_SITE_OTHER): Payer: Self-pay | Admitting: Family Medicine

## 2011-10-01 DIAGNOSIS — M545 Low back pain, unspecified: Secondary | ICD-10-CM

## 2011-10-01 DIAGNOSIS — M25511 Pain in right shoulder: Secondary | ICD-10-CM

## 2011-10-01 DIAGNOSIS — M25539 Pain in unspecified wrist: Secondary | ICD-10-CM

## 2011-10-01 DIAGNOSIS — M7500 Adhesive capsulitis of unspecified shoulder: Secondary | ICD-10-CM

## 2011-10-01 DIAGNOSIS — M25532 Pain in left wrist: Secondary | ICD-10-CM

## 2011-10-01 DIAGNOSIS — M25519 Pain in unspecified shoulder: Secondary | ICD-10-CM

## 2011-10-01 MED ORDER — OXYCODONE-ACETAMINOPHEN 10-325 MG PO TABS: 1.0000 | ORAL_TABLET | Freq: Three times a day (TID) | ORAL | Status: DC | PRN

## 2011-10-01 MED ORDER — MELOXICAM 15 MG PO TABS: 15.0000 mg | ORAL_TABLET | Freq: Every day | ORAL | Status: DC

## 2011-10-01 NOTE — Patient Instructions (Signed)
It was great to see you today!  Schedule an appointment to see me as needed.  I will refer you to Dr. Tamala Julian to look at your lower back.

## 2011-10-02 DIAGNOSIS — M545 Low back pain, unspecified: Secondary | ICD-10-CM | POA: Insufficient documentation

## 2011-10-02 DIAGNOSIS — M25532 Pain in left wrist: Secondary | ICD-10-CM | POA: Insufficient documentation

## 2011-10-02 NOTE — Assessment & Plan Note (Signed)
Will get xrays of the left wrist to make sure no occult stress fractures are present.  He is using percocet for this pain as well.

## 2011-10-02 NOTE — Assessment & Plan Note (Signed)
Because this is acute on chronic without MRI findings that correlate and location appears muscular, I will refer him to Dr. Tamala Julian for evaluation. He may be a candidate for manipulation.  I prescribed him Percocet and increased the dose to 10-325 mg. His wife testifies to him using this medication sparingly and only when in a lot of pain. She watches him closely. No constipation. I have also prescribed mobic.  I would like to get him off of narcotic medication for these problems.

## 2011-10-02 NOTE — Progress Notes (Signed)
  Subjective:    Patient ID: David [name], male    DOB: [birth year], 56 y.o.   MRN: [id number]  HPI 1. Right lumbar back pain Patient c/o 3 day history of right lower back pain without mechanism of injury. Pain is paraspinal on the right. Exam shows it to be in the muscular region. He describes the pain as "ache" He has no radiation or shooting pains. He has no neurological symptoms.  Per chart review has had this pain before. He has had a trigger point injection for this before.  MRI shows some spinal pathology, but low grade and doesn't fit with pain location. Patient has been taking two 5-325mg  percocet tabs q 8 hours.  2. Left wrist pain Patient has continued left wrist pain. Arthritis most likely.  Reviewed chart and I do not see any imaging done for his wrist. This was a diagnosis from previous PCP, I do not see a formal evaluation.   Review of Systems Pertinent items are noted in HPI. No fever, chills, night sweats, weight loss.     Objective:   Physical Exam Filed Vitals:   10/01/11 1634  BP: 122/80  Pulse: 76  Temp: 97.9 F (36.6 C)  TempSrc: Oral  Weight: 168 lb 12.8 oz (76.567 kg)  Lungs:  Normal respiratory effort, chest expands symmetrically. Lungs are clear to auscultation, no crackles or wheezes.  Right Back: ttp of right paraspinal muscles. Negative straight leg raise test. No hip localization (anterior groin). Gait normal.  Reflexes normal patellar. Left wrist: No TTP, no deformity, ROM wnl.      Assessment & Plan:

## 2011-10-08 ENCOUNTER — Ambulatory Visit (INDEPENDENT_AMBULATORY_CARE_PROVIDER_SITE_OTHER): Payer: Self-pay | Admitting: Family Medicine

## 2011-10-08 ENCOUNTER — Encounter: Payer: Self-pay | Admitting: Family Medicine

## 2011-10-08 DIAGNOSIS — M999 Biomechanical lesion, unspecified: Secondary | ICD-10-CM | POA: Insufficient documentation

## 2011-10-08 DIAGNOSIS — G5700 Lesion of sciatic nerve, unspecified lower limb: Secondary | ICD-10-CM

## 2011-10-08 DIAGNOSIS — G5701 Lesion of sciatic nerve, right lower limb: Secondary | ICD-10-CM

## 2011-10-08 NOTE — Assessment & Plan Note (Signed)
Patient counseled at length about potential stretches as well as tennis ball and back pocket while sitting. Patient told about proper shoe wear. Likely will improve and improved with some muscle energy as well as counterstrain techniques today.

## 2011-10-08 NOTE — Progress Notes (Signed)
  Subjective:    Patient ID: David [name], male    DOB: 14-Jun-1956, 56 y.o.   MRN: [id number]  HPI 56 year old gentleman referred to me by Dr. Vallarie Mare for chronic lower back pain. Patient states that he did notice this multiple weeks ago when he was trying to move his sofa alone. Patient states he went to lift it felt some pain in the right lower region. Since that time with certain movements he has the same pain with radiation into his legs. Patient states that area seems stiff and after a long time walking he has some problems. Patient states that it is not affecting too much his daily activity but has had to take Percocet for this pain. Patient has not taken everyday but sometimes he cannot help it. Patient denies any type of weakness in the legs and denies any bowel or bladder problems. Patient was sent here for potential manipulation imaging studies have been reviewed.   Review of Systems No fevers no chills no weight loss no history of any rheumatoid arthritis or other autoimmune problems.    Objective:   Physical Exam Vitals reviewed Gen. no apparent distress thin male  OMT Findings: Cervical: C3 rotated and side bent left Thoracic: T5 extended rotated and side bent right Lumbar: L3 rotated and side bent left in flexion Sacrum: Right posterior sacrum with extremely tight piriformis.    Assessment & Plan:

## 2011-10-08 NOTE — Patient Instructions (Signed)
Low Back Sprain with Rehab  A sprain is an injury in which a ligament is torn. The ligaments of the lower back are vulnerable to sprains. However, they are strong and require great force to be injured. These ligaments are important for stabilizing the spinal column. Sprains are classified into three categories. Grade 1 sprains cause pain, but the tendon is not lengthened. Grade 2 sprains include a lengthened ligament, due to the ligament being stretched or partially ruptured. With grade 2 sprains there is still function, although the function may be decreased. Grade 3 sprains involve a complete tear of the tendon or muscle, and function is usually impaired. SYMPTOMS   Severe pain in the lower back.   Sometimes, a feeling of a "pop," "snap," or tear, at the time of injury.   Tenderness and sometimes swelling at the injury site.   Uncommonly, bruising (contusion) within 48 hours of injury.   Muscle spasms in the back.  CAUSES  Low back sprains occur when a force is placed on the ligaments that is greater than they can handle. Common causes of injury include:  Performing a stressful act while off-balance.   Repetitive stressful activities that involve movement of the lower back.   Direct hit (trauma) to the lower back.  RISK INCREASES WITH:  Contact sports (football, wrestling).   Collisions (major skiing accidents).   Sports that require throwing or lifting (baseball, weightlifting).   Sports involving twisting of the spine (gymnastics, diving, tennis, golf).   Poor strength and flexibility.   Inadequate protection.   Previous back injury or surgery (especially fusion).  PREVENTION  Wear properly fitted and padded protective equipment.   Warm up and stretch properly before activity.   Allow for adequate recovery between workouts.   Maintain physical fitness:   Strength, flexibility, and endurance.   Cardiovascular fitness.   Maintain a healthy body weight.  PROGNOSIS   If treated properly, low back sprains usually heal with non-surgical treatment. The length of time for healing depends on the severity of the injury.  RELATED COMPLICATIONS   Recurring symptoms, resulting in a chronic problem.   Chronic inflammation and pain in the low back.   Delayed healing or resolution of symptoms, especially if activity is resumed too soon.   Prolonged impairment.   Unstable or arthritic joints of the low back.  TREATMENT  Treatment first involves the use of ice and medicine, to reduce pain and inflammation. The use of strengthening and stretching exercises may help reduce pain with activity. These exercises may be performed at home or with a therapist. Severe injuries may require referral to a therapist for further evaluation and treatment, such as ultrasound. Your caregiver may advise that you wear a back brace or corset, to help reduce pain and discomfort. Often, prolonged bed rest results in greater harm then benefit. Corticosteroid injections may be recommended. However, these should be reserved for the most serious cases. It is important to avoid using your back when lifting objects. At night, sleep on your back on a firm mattress, with a pillow placed under your knees. If non-surgical treatment is unsuccessful, surgery may be needed.  MEDICATION   If pain medicine is needed, nonsteroidal anti-inflammatory medicines (aspirin and ibuprofen), or other minor pain relievers (acetaminophen), are often advised.   Do not take pain medicine for 7 days before surgery.   Prescription pain relievers may be given, if your caregiver thinks they are needed. Use only as directed and only as much as you  need.   Ointments applied to the skin may be helpful.   Corticosteroid injections may be given by your caregiver. These injections should be reserved for the most serious cases, because they may only be given a certain number of times.  HEAT AND COLD  Cold treatment (icing)  should be applied for 10 to 15 minutes every 2 to 3 hours for inflammation and pain, and immediately after activity that aggravates your symptoms. Use ice packs or an ice massage.   Heat treatment may be used before performing stretching and strengthening activities prescribed by your caregiver, physical therapist, or athletic trainer. Use a heat pack or a warm water soak.  SEEK MEDICAL CARE IF:   Symptoms get worse or do not improve in 2 to 4 weeks, despite treatment.   You develop numbness or weakness in either leg.   You lose bowel or bladder function.   Any of the following occur after surgery: fever, increased pain, swelling, redness, drainage of fluids, or bleeding in the affected area.   New, unexplained symptoms develop. (Drugs used in treatment may produce side effects.)  EXERCISES  RANGE OF MOTION (ROM) AND STRETCHING EXERCISES - Low Back Sprain Most people with lower back pain will find that their symptoms get worse with excessive bending forward (flexion) or arching at the lower back (extension). The exercises that will help resolve your symptoms will focus on the opposite motion.  Your physician, physical therapist or athletic trainer will help you determine which exercises will be most helpful to resolve your lower back pain. Do not complete any exercises without first consulting with your caregiver. Discontinue any exercises which make your symptoms worse, until you speak to your caregiver. If you have pain, numbness or tingling which travels down into your buttocks, leg or foot, the goal of the therapy is for these symptoms to move closer to your back and eventually resolve. Sometimes, these leg symptoms will get better, but your lower back pain may worsen. This is often an indication of progress in your rehabilitation. Be very alert to any changes in your symptoms and the activities in which you participated in the 24 hours prior to the change. Sharing this information with your  caregiver will allow him or her to most efficiently treat your condition. These exercises may help you when beginning to rehabilitate your injury. Your symptoms may resolve with or without further involvement from your physician, physical therapist or athletic trainer. While completing these exercises, remember:   Restoring tissue flexibility helps normal motion to return to the joints. This allows healthier, less painful movement and activity.   An effective stretch should be held for at least 30 seconds.   A stretch should never be painful. You should only feel a gentle lengthening or release in the stretched tissue.  FLEXION RANGE OF MOTION AND STRETCHING EXERCISES: STRETCH - Flexion, Single Knee to Chest   Lie on a firm bed or floor with both legs extended in front of you.   Keeping one leg in contact with the floor, bring your opposite knee to your chest. Hold your leg in place by either grabbing behind your thigh or at your knee.   Pull until you feel a gentle stretch in your low back. Hold __________ seconds.   Slowly release your grasp and repeat the exercise with the opposite side.  Repeat __________ times. Complete this exercise __________ times per day.  STRETCH - Flexion, Double Knee to Chest  Lie on a firm bed or  floor with both legs extended in front of you.   Keeping one leg in contact with the floor, bring your opposite knee to your chest.   Tense your stomach muscles to support your back and then lift your other knee to your chest. Hold your legs in place by either grabbing behind your thighs or at your knees.   Pull both knees toward your chest until you feel a gentle stretch in your low back. Hold __________ seconds.   Tense your stomach muscles and slowly return one leg at a time to the floor.  Repeat __________ times. Complete this exercise __________ times per day.  STRETCH - Low Trunk Rotation  Lie on a firm bed or floor. Keeping your legs in front of you, bend  your knees so they are both pointed toward the ceiling and your feet are flat on the floor.   Extend your arms out to the side. This will stabilize your upper body by keeping your shoulders in contact with the floor.   Gently and slowly drop both knees together to one side until you feel a gentle stretch in your low back. Hold for __________ seconds.   Tense your stomach muscles to support your lower back as you bring your knees back to the starting position. Repeat the exercise to the other side.  Repeat __________ times. Complete this exercise __________ times per day  EXTENSION RANGE OF MOTION AND FLEXIBILITY EXERCISES: STRETCH - Extension, Prone on Elbows   Lie on your stomach on the floor, a bed will be too soft. Place your palms about shoulder width apart and at the height of your head.   Place your elbows under your shoulders. If this is too painful, stack pillows under your chest.   Allow your body to relax so that your hips drop lower and make contact more completely with the floor.   Hold this position for __________ seconds.   Slowly return to lying flat on the floor.  Repeat __________ times. Complete this exercise __________ times per day.  RANGE OF MOTION - Extension, Prone Press Ups  Lie on your stomach on the floor, a bed will be too soft. Place your palms about shoulder width apart and at the height of your head.   Keeping your back as relaxed as possible, slowly straighten your elbows while keeping your hips on the floor. You may adjust the placement of your hands to maximize your comfort. As you gain motion, your hands will come more underneath your shoulders.   Hold this position __________ seconds.   Slowly return to lying flat on the floor.  Repeat __________ times. Complete this exercise __________ times per day.  RANGE OF MOTION- Quadruped, Neutral Spine   Assume a hands and knees position on a firm surface. Keep your hands under your shoulders and your knees  under your hips. You may place padding under your knees for comfort.   Drop your head and point your tailbone toward the ground below you. This will round out your lower back like an angry cat. Hold this position for __________ seconds.   Slowly lift your head and release your tail bone so that your back sags into a large arch, like an old horse.   Hold this position for __________ seconds.   Repeat this until you feel limber in your low back.   Now, find your "sweet spot." This will be the most comfortable position somewhere between the two previous positions. This is your neutral spine.  Once you have found this position, tense your stomach muscles to support your low back.   Hold this position for __________ seconds.  Repeat __________ times. Complete this exercise __________ times per day.  STRENGTHENING EXERCISES - Low Back Sprain These exercises may help you when beginning to rehabilitate your injury. These exercises should be done near your "sweet spot." This is the neutral, low-back arch, somewhere between fully rounded and fully arched, that is your least painful position. When performed in this safe range of motion, these exercises can be used for people who have either a flexion or extension based injury. These exercises may resolve your symptoms with or without further involvement from your physician, physical therapist or athletic trainer. While completing these exercises, remember:   Muscles can gain both the endurance and the strength needed for everyday activities through controlled exercises.   Complete these exercises as instructed by your physician, physical therapist or athletic trainer. Increase the resistance and repetitions only as guided.   You may experience muscle soreness or fatigue, but the pain or discomfort you are trying to eliminate should never worsen during these exercises. If this pain does worsen, stop and make certain you are following the directions exactly.  If the pain is still present after adjustments, discontinue the exercise until you can discuss the trouble with your caregiver.  STRENGTHENING - Deep Abdominals, Pelvic Tilt   Lie on a firm bed or floor. Keeping your legs in front of you, bend your knees so they are both pointed toward the ceiling and your feet are flat on the floor.   Tense your lower abdominal muscles to press your low back into the floor. This motion will rotate your pelvis so that your tail bone is scooping upwards rather than pointing at your feet or into the floor.  With a gentle tension and even breathing, hold this position for __________ seconds. Repeat __________ times. Complete this exercise __________ times per day.  STRENGTHENING - Abdominals, Crunches   Lie on a firm bed or floor. Keeping your legs in front of you, bend your knees so they are both pointed toward the ceiling and your feet are flat on the floor. Cross your arms over your chest.   Slightly tip your chin down without bending your neck.   Tense your abdominals and slowly lift your trunk high enough to just clear your shoulder blades. Lifting higher can put excessive stress on the lower back and does not further strengthen your abdominal muscles.   Control your return to the starting position.  Repeat __________ times. Complete this exercise __________ times per day.  STRENGTHENING - Quadruped, Opposite UE/LE Lift   Assume a hands and knees position on a firm surface. Keep your hands under your shoulders and your knees under your hips. You may place padding under your knees for comfort.   Find your neutral spine and gently tense your abdominal muscles so that you can maintain this position. Your shoulders and hips should form a rectangle that is parallel with the floor and is not twisted.   Keeping your trunk steady, lift your right hand no higher than your shoulder and then your left leg no higher than your hip. Make sure you are not holding your  breath. Hold this position for __________ seconds.   Continuing to keep your abdominal muscles tense and your back steady, slowly return to your starting position. Repeat with the opposite arm and leg.  Repeat __________ times. Complete this exercise __________ times  per day.  STRENGTHENING - Abdominals and Quadriceps, Straight Leg Raise   Lie on a firm bed or floor with both legs extended in front of you.   Keeping one leg in contact with the floor, bend the other knee so that your foot can rest flat on the floor.   Find your neutral spine, and tense your abdominal muscles to maintain your spinal position throughout the exercise.   Slowly lift your straight leg off the floor about 6 inches for a count of 15, making sure to not hold your breath.   Still keeping your neutral spine, slowly lower your leg all the way to the floor.  Repeat this exercise with each leg __________ times. Complete this exercise __________ times per day. POSTURE AND BODY MECHANICS CONSIDERATIONS - Low Back Sprain Keeping correct posture when sitting, standing or completing your activities will reduce the stress put on different body tissues, allowing injured tissues a chance to heal and limiting painful experiences. The following are general guidelines for improved posture. Your physician or physical therapist will provide you with any instructions specific to your needs. While reading these guidelines, remember:  The exercises prescribed by your provider will help you have the flexibility and strength to maintain correct postures.   The correct posture provides the best environment for your joints to work. All of your joints have less wear and tear when properly supported by a spine with good posture. This means you will experience a healthier, less painful body.   Correct posture must be practiced with all of your activities, especially prolonged sitting and standing. Correct posture is as important when doing  repetitive low-stress activities (typing) as it is when doing a single heavy-load activity (lifting).  RESTING POSITIONS Consider which positions are most painful for you when choosing a resting position. If you have pain with flexion-based activities (sitting, bending, stooping, squatting), choose a position that allows you to rest in a less flexed posture. You would want to avoid curling into a fetal position on your side. If your pain worsens with extension-based activities (prolonged standing, working overhead), avoid resting in an extended position such as sleeping on your stomach. Most people will find more comfort when they rest with their spine in a more neutral position, neither too rounded nor too arched. Lying on a non-sagging bed on your side with a pillow between your knees, or on your back with a pillow under your knees will often provide some relief. Keep in mind, being in any one position for a prolonged period of time, no matter how correct your posture, can still lead to stiffness. PROPER SITTING POSTURE In order to minimize stress and discomfort on your spine, you must sit with correct posture. Sitting with good posture should be effortless for a healthy body. Returning to good posture is a gradual process. Many people can work toward this most comfortably by using various supports until they have the flexibility and strength to maintain this posture on their own. When sitting with proper posture, your ears will fall over your shoulders and your shoulders will fall over your hips. You should use the back of the chair to support your upper back. Your lower back will be in a neutral position, just slightly arched. You may place a small pillow or folded towel at the base of your lower back for  support.  When working at a desk, create an environment that supports good, upright posture. Without extra support, muscles tire, which leads to excessive  strain on joints and other tissues. Keep these  recommendations in mind: CHAIR:  A chair should be able to slide under your desk when your back makes contact with the back of the chair. This allows you to work closely.   The chair's height should allow your eyes to be level with the upper part of your monitor and your hands to be slightly lower than your elbows.  BODY POSITION  Your feet should make contact with the floor. If this is not possible, use a foot rest.   Keep your ears over your shoulders. This will reduce stress on your neck and low back.  INCORRECT SITTING POSTURES  If you are feeling tired and unable to assume a healthy sitting posture, do not slouch or slump. This puts excessive strain on your back tissues, causing more damage and pain. Healthier options include:  Using more support, like a lumbar pillow.   Switching tasks to something that requires you to be upright or walking.   Talking a brief walk.   Lying down to rest in a neutral-spine position.  PROLONGED STANDING WHILE SLIGHTLY LEANING FORWARD  When completing a task that requires you to lean forward while standing in one place for a long time, place either foot up on a stationary 2-4 inch high object to help maintain the best posture. When both feet are on the ground, the lower back tends to lose its slight inward curve. If this curve flattens (or becomes too large), then the back and your other joints will experience too much stress, tire more quickly, and can cause pain. CORRECT STANDING POSTURES Proper standing posture should be assumed with all daily activities, even if they only take a few moments, like when brushing your teeth. As in sitting, your ears should fall over your shoulders and your shoulders should fall over your hips. You should keep a slight tension in your abdominal muscles to brace your spine. Your tailbone should point down to the ground, not behind your body, resulting in an over-extended swayback posture.  INCORRECT STANDING POSTURES    Common incorrect standing postures include a forward head, locked knees and/or an excessive swayback. WALKING Walk with an upright posture. Your ears, shoulders and hips should all line-up. PROLONGED ACTIVITY IN A FLEXED POSITION When completing a task that requires you to bend forward at your waist or lean over a low surface, try to find a way to stabilize 3 out of 4 of your limbs. You can place a hand or elbow on your thigh or rest a knee on the surface you are reaching across. This will provide you more stability, so that your muscles do not tire as quickly. By keeping your knees relaxed, or slightly bent, you will also reduce stress across your lower back. CORRECT LIFTING TECHNIQUES DO :  Assume a wide stance. This will provide you more stability and the opportunity to get as close as possible to the object which you are lifting.   Tense your abdominals to brace your spine. Bend at the knees and hips. Keeping your back locked in a neutral-spine position, lift using your leg muscles. Lift with your legs, keeping your back straight.   Test the weight of unknown objects before attempting to lift them.   Try to keep your elbows locked down at your sides in order get the best strength from your shoulders when carrying an object.   Always ask for help when lifting heavy or awkward objects.  INCORRECT LIFTING TECHNIQUES DO  NOT:   Lock your knees when lifting, even if it is a small object.   Bend and twist. Pivot at your feet or move your feet when needing to change directions.   Assume that you can safely pick up even a paperclip without proper posture.  Document Released: 08/17/2005 Document Revised: 04/29/2011 Document Reviewed: 11/29/2008 [facility] Patient Information 2012 [place].

## 2011-10-08 NOTE — Assessment & Plan Note (Signed)
After verbal consent pt did have HVLA, with marked improvement.  Gave side effects to look out for and can take anti inflammatories in the acute time frame.  Given stretches to do at home no signs of herniated disc or anything alarming. Likely all his pain is muscle skeletal in nature. Patient does have a followup appointment scheduler he with me and we'll see if patient improves within the time frame.

## 2011-10-27 ENCOUNTER — Ambulatory Visit: Payer: Self-pay | Admitting: Family Medicine

## 2011-11-17 ENCOUNTER — Other Ambulatory Visit: Payer: Self-pay | Admitting: Family Medicine

## 2011-11-17 DIAGNOSIS — M25532 Pain in left wrist: Secondary | ICD-10-CM

## 2011-11-17 MED ORDER — MELOXICAM 15 MG PO TABS: 15.0000 mg | ORAL_TABLET | Freq: Every day | ORAL | Status: DC

## 2012-01-01 ENCOUNTER — Ambulatory Visit (INDEPENDENT_AMBULATORY_CARE_PROVIDER_SITE_OTHER): Payer: Medicaid Other | Admitting: Family Medicine

## 2012-01-01 ENCOUNTER — Encounter: Payer: Self-pay | Admitting: Family Medicine

## 2012-01-01 VITALS — BP 138/82 | HR 96 | Temp 98.6°F | Ht 70.0 in | Wt 168.0 lb

## 2012-01-01 DIAGNOSIS — M25532 Pain in left wrist: Secondary | ICD-10-CM

## 2012-01-01 DIAGNOSIS — M7500 Adhesive capsulitis of unspecified shoulder: Secondary | ICD-10-CM

## 2012-01-01 DIAGNOSIS — M25539 Pain in unspecified wrist: Secondary | ICD-10-CM

## 2012-01-01 MED ORDER — PSEUDOEPH-BROMPHEN-DM 30-2-10 MG/5ML PO SYRP: 5.0000 mL | ORAL_SOLUTION | Freq: Four times a day (QID) | ORAL | Status: DC | PRN

## 2012-01-01 MED ORDER — DICLOFENAC SODIUM 75 MG PO TBEC: 75.0000 mg | DELAYED_RELEASE_TABLET | Freq: Two times a day (BID) | ORAL | Status: DC

## 2012-01-01 MED ORDER — OXYCODONE-ACETAMINOPHEN 10-325 MG PO TABS: 1.0000 | ORAL_TABLET | Freq: Three times a day (TID) | ORAL | Status: DC | PRN

## 2012-01-01 NOTE — Patient Instructions (Signed)
I have injected your wrist with steroid and numbing medication.  Im glad your back is better.  I have refilled your pain medication.

## 2012-01-01 NOTE — Progress Notes (Signed)
  Subjective:    Patient ID: David [name], male    DOB: Apr 14, 1956, 56 y.o.   MRN: [id number]  HPI  1. Left wrist pain Patient has continued left wrist pain. Arthritis most likely.  Reviewed chart and I do not see any imaging done for his wrist. Some swelling.  Started years ago.  Aggravating: movement Alleviating: percocet  Tobacco use: Patient is a non smoker.   Review of Systems Pertinent items are noted in HPI.     Objective:   Physical Exam Filed Vitals:   01/01/12 1615  BP: 138/82  Pulse: 96  Temp: 98.6 F (37 C)  TempSrc: Oral  Height: 5\' 10"  (1.778 m)  Weight: 168 lb (76.204 kg)  General: aam, pleasant, nad.  Left wrist: No TTP, no deformity, ROM wnl. Swelling of the anatomical snuff box.     Assessment & Plan:

## 2012-01-01 NOTE — Progress Notes (Signed)
Addended by: Lyndee Hensen on: 01/01/2012 04:38 PM   Modules accepted: Orders

## 2012-01-01 NOTE — Progress Notes (Signed)
Procedure Note: Injection of left wrist  Informed Consent Obtained for above procedure. All Risks, benefits, alternative, complications reviewed with patient and understood. Time Out performed. Using topical freezing local anesthesia was obtained. Using a 25 gauge needle the medial left wrist joint was entered. 2 cc's of lidocaine and kenalog was injected.  The patient tolerated the procedure well. No complications occurred. Patient counseled on post-operative care.

## 2012-01-04 ENCOUNTER — Other Ambulatory Visit: Payer: Self-pay | Admitting: Family Medicine

## 2012-01-04 MED ORDER — LORATADINE-PSEUDOEPHEDRINE ER 10-240 MG PO TB24: 1.0000 | ORAL_TABLET | Freq: Every day | ORAL | Status: AC

## 2012-01-11 ENCOUNTER — Ambulatory Visit (INDEPENDENT_AMBULATORY_CARE_PROVIDER_SITE_OTHER): Payer: Medicaid Other | Admitting: Family Medicine

## 2012-01-11 ENCOUNTER — Emergency Department (HOSPITAL_COMMUNITY): Payer: Medicaid Other

## 2012-01-11 ENCOUNTER — Encounter (HOSPITAL_COMMUNITY): Payer: Self-pay | Admitting: Emergency Medicine

## 2012-01-11 ENCOUNTER — Encounter: Payer: Self-pay | Admitting: Family Medicine

## 2012-01-11 ENCOUNTER — Emergency Department (HOSPITAL_COMMUNITY)
Admission: EM | Admit: 2012-01-11 | Discharge: 2012-01-11 | Disposition: A | Discharge status: Home / Self Care | Payer: Medicaid Other | Attending: Emergency Medicine | Admitting: Emergency Medicine

## 2012-01-11 VITALS — BP 124/88 | HR 78 | Temp 97.8°F | Ht 70.0 in | Wt 167.0 lb

## 2012-01-11 DIAGNOSIS — Z8739 Personal history of other diseases of the musculoskeletal system and connective tissue: Secondary | ICD-10-CM | POA: Insufficient documentation

## 2012-01-11 DIAGNOSIS — L02511 Cutaneous abscess of right hand: Secondary | ICD-10-CM

## 2012-01-11 DIAGNOSIS — IMO0002 Reserved for concepts with insufficient information to code with codable children: Secondary | ICD-10-CM | POA: Insufficient documentation

## 2012-01-11 DIAGNOSIS — Z79899 Other long term (current) drug therapy: Secondary | ICD-10-CM | POA: Insufficient documentation

## 2012-01-11 DIAGNOSIS — L02519 Cutaneous abscess of unspecified hand: Secondary | ICD-10-CM | POA: Insufficient documentation

## 2012-01-11 MED ORDER — SULFAMETHOXAZOLE-TRIMETHOPRIM 800-160 MG PO TABS: 1.0000 | ORAL_TABLET | Freq: Two times a day (BID) | ORAL | Status: AC

## 2012-01-11 MED ORDER — MORPHINE SULFATE 4 MG/ML IJ SOLN
4.0000 mg | Freq: Once | INTRAMUSCULAR | Status: AC
  Administered 2012-01-11: 4 mg via INTRAVENOUS
  Filled 2012-01-11: qty 1

## 2012-01-11 MED ORDER — CEFAZOLIN SODIUM 1-5 GM-% IV SOLN
1.0000 g | Freq: Once | INTRAVENOUS | Status: AC
  Administered 2012-01-11: 1 g via INTRAVENOUS
  Filled 2012-01-11: qty 50

## 2012-01-11 MED ORDER — OXYCODONE-ACETAMINOPHEN 5-325 MG PO TABS: 1.0000 | ORAL_TABLET | ORAL | Status: DC | PRN
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
## 2012-01-12 NOTE — ED Provider Notes (Signed)
Medical screening examination/treatment/procedure(s) were performed by non-physician practitioner and as supervising physician I was immediately available for consultation/collaboration.  Richarda Blade, MD 01/12/12 Lurena Nida

## 2012-02-05 ENCOUNTER — Telehealth: Payer: Self-pay | Admitting: Family Medicine

## 2012-02-05 NOTE — Telephone Encounter (Signed)
Patient is calling for a refill on Percocet 10mg .  Please call him when ready to be picked up.

## 2012-02-08 NOTE — Telephone Encounter (Signed)
Patient is calling back to check on the status of his refill on Percocet.

## 2012-02-08 NOTE — Telephone Encounter (Signed)
I do not wish to continue treating his pain with percocet. He should schedule an appointment with me to discuss stopping this medication.

## 2012-02-11 ENCOUNTER — Encounter: Payer: Self-pay | Admitting: Family Medicine

## 2012-02-11 ENCOUNTER — Ambulatory Visit: Payer: Medicaid Other | Admitting: Family Medicine

## 2012-02-11 ENCOUNTER — Ambulatory Visit (INDEPENDENT_AMBULATORY_CARE_PROVIDER_SITE_OTHER): Payer: Medicaid Other | Admitting: Family Medicine

## 2012-02-11 VITALS — BP 133/84 | HR 95 | Ht 70.0 in | Wt 166.9 lb

## 2012-02-11 DIAGNOSIS — M25539 Pain in unspecified wrist: Secondary | ICD-10-CM

## 2012-02-11 DIAGNOSIS — M7500 Adhesive capsulitis of unspecified shoulder: Secondary | ICD-10-CM

## 2012-02-11 DIAGNOSIS — M25532 Pain in left wrist: Secondary | ICD-10-CM

## 2012-02-11 MED ORDER — OXYCODONE-ACETAMINOPHEN 10-325 MG PO TABS: 1.0000 | ORAL_TABLET | Freq: Three times a day (TID) | ORAL | Status: DC | PRN

## 2012-02-11 NOTE — Progress Notes (Signed)
David [name] is a 56 y.o. male who presents to [facility] today for left wrist pain. Patient is a patient of Dr. Vallarie Mare.  He is here for irregularly scheduled visit to discuss chronic pain. He is usually well-controlled with Percocet 10 total #90 per month. However Dr. Vallarie Mare a cold away from clinic unexpectedly this morning. Patient notes the pain medication tends to work well.  He denies any nausea constipation vomiting itching or lethargy.  The medications allow him to be more active and complete tasks such as mowing the lawn.   PMH: Reviewed significant for chronic left wrist pain History  Substance Use Topics  . Smoking status: Never Smoker   . Smokeless tobacco: Not on file  . Alcohol Use: Yes     occasion   ROS as above  Medications reviewed. Current Outpatient Prescriptions  Medication Sig Dispense Refill  . oxyCODONE-acetaminophen (PERCOCET) 10-325 MG per tablet Take 1 tablet by mouth every 8 (eight) hours as needed for pain.  90 tablet  0  . Aspirin (ADULT ASPIRIN LOW STRENGTH) 81 MG EC tablet Take 1 tablet (81 mg total) by mouth daily.  90 tablet  3  . diclofenac (VOLTAREN) 75 MG EC tablet Take 1 tablet (75 mg total) by mouth 2 (two) times daily.  60 tablet  6  . loratadine-pseudoephedrine (CLARITIN-D 24 HOUR) 10-240 MG per 24 hr tablet Take 1 tablet by mouth daily.  30 tablet  5  . Tamsulosin HCl (FLOMAX) 0.4 MG CAPS Take 1 capsule (0.4 mg total) by mouth daily.  30 capsule  6  . DISCONTD: oxyCODONE-acetaminophen (PERCOCET) 10-325 MG per tablet Take 1 tablet by mouth every 8 (eight) hours as needed for pain.  90 tablet  0  . DISCONTD: oxyCODONE-acetaminophen (PERCOCET) 10-325 MG per tablet Take 1 tablet by mouth every 8 (eight) hours as needed for pain.  90 tablet  0  . DISCONTD: oxyCODONE-acetaminophen (PERCOCET) 5-325 MG per tablet Take 1 tablet by mouth every 4 (four) hours as needed for pain.  12 tablet  0    Exam:  BP 133/84  Pulse 95  Ht 5\' 10"  (1.778 m)  Wt 166 lb 14.4 oz  (75.705 kg)  BMI 23.95 kg/m2 Gen: Well NAD LEFT WRIST: Decreased motion slightly swollen no specific point tenderness.  No results found for this or any previous visit (from the past 72 hour(s)).

## 2012-02-11 NOTE — Assessment & Plan Note (Signed)
Previously well controlled with oxycodone. This is a chronic pain and doing well Will refill pain medications. Discuss with patient that it is unusual for another provider to refill chronic pain medications but will make an exception this time as Dr. Vallarie Mare was called out unexpectedly.

## 2012-02-11 NOTE — Patient Instructions (Addendum)
Thank you for coming in today. Please take the percocet up to 3x a day as needed for pain.  Let us know about nausea, constipation, itching or fatigue.  See Dr. Vallarie Mare in 1 month.

## 2012-04-19 ENCOUNTER — Ambulatory Visit (INDEPENDENT_AMBULATORY_CARE_PROVIDER_SITE_OTHER): Payer: Medicaid Other | Admitting: Family Medicine

## 2012-04-19 ENCOUNTER — Encounter: Payer: Self-pay | Admitting: Family Medicine

## 2012-04-19 VITALS — BP 130/82 | HR 77 | Ht 70.0 in | Wt 163.0 lb

## 2012-04-19 DIAGNOSIS — M25539 Pain in unspecified wrist: Secondary | ICD-10-CM

## 2012-04-19 DIAGNOSIS — M545 Low back pain, unspecified: Secondary | ICD-10-CM

## 2012-04-19 DIAGNOSIS — M25519 Pain in unspecified shoulder: Secondary | ICD-10-CM

## 2012-04-19 DIAGNOSIS — M25511 Pain in right shoulder: Secondary | ICD-10-CM

## 2012-04-19 DIAGNOSIS — M75120 Complete rotator cuff tear or rupture of unspecified shoulder, not specified as traumatic: Secondary | ICD-10-CM

## 2012-04-19 DIAGNOSIS — R5381 Other malaise: Secondary | ICD-10-CM

## 2012-04-19 DIAGNOSIS — M25532 Pain in left wrist: Secondary | ICD-10-CM

## 2012-04-19 DIAGNOSIS — N400 Enlarged prostate without lower urinary tract symptoms: Secondary | ICD-10-CM

## 2012-04-19 DIAGNOSIS — R5383 Other fatigue: Secondary | ICD-10-CM

## 2012-04-19 DIAGNOSIS — M7500 Adhesive capsulitis of unspecified shoulder: Secondary | ICD-10-CM

## 2012-04-19 DIAGNOSIS — Z13220 Encounter for screening for lipoid disorders: Secondary | ICD-10-CM

## 2012-04-19 DIAGNOSIS — Z125 Encounter for screening for malignant neoplasm of prostate: Secondary | ICD-10-CM

## 2012-04-19 MED ORDER — OXYCODONE-ACETAMINOPHEN 5-500 MG PO TABS: 1.0000 | ORAL_TABLET | ORAL | Status: DC | PRN

## 2012-04-19 MED ORDER — DICLOFENAC SODIUM 75 MG PO TBEC: 75.0000 mg | DELAYED_RELEASE_TABLET | Freq: Two times a day (BID) | ORAL | Status: AC

## 2012-04-19 MED ORDER — TAMSULOSIN HCL 0.4 MG PO CAPS: 0.4000 mg | ORAL_CAPSULE | Freq: Every day | ORAL | Status: DC

## 2012-04-19 NOTE — Patient Instructions (Addendum)
I have refilled your tamsulosin for prostate and voltaren for pain  I have refilled oxycodone 5/500 for pain.  Thanks for working on reducing this medicine.  See how you can space out this medicine to only use it only as needed.  Make appointment for fasting labwork (only water for 8 hours)  Follow-up in one month

## 2012-04-19 NOTE — Progress Notes (Signed)
  Subjective:    Patient ID: David [name], male    DOB: 1956/04/16, 56 y.o.   MRN: [id number]  HPIHere to meet new MD  Chronic pain:  Has been getting narcotics regularly for the past 6 months for variety of reasons.  Had history of right shoulder surgery in 05/2011 now with frozen shoulder.  Also has chronic low back pain.  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  Also had hand cellulitis earlier this spring. Feels oxycodone helps him function better without pain.    BPH:  Note simprovement in urinary hesitancy sinc ebeing palced on    tamsulosin Review of Systems     Objective:   Physical Exam GEN: Alert & Oriented, No acute distress CV:  Regular Rate & Rhythm, no murmur Respiratory:  Normal work of breathing, CTAB Abd:  + BS, soft, no tenderness to palpation Ext: no pre-tibial edema MSK:  Limited ROm on right shoulder.  Left wrist painful to palpation on medial sie.  Neg finklestein.  Back nontender to palpation.       Assessment & Plan:

## 2012-04-20 NOTE — Assessment & Plan Note (Signed)
Discussed with patient cutting back on narcotics.  He volunteered to decreased from oxycodone-apap 10/500 to [place].  Will keep quantity # 90 the same.  Will refill with voltaren, follow-up in 3 months.  Plan to continue to work with him on lowering dose.  Will address narcotics contract at next visit.

## 2012-04-20 NOTE — Assessment & Plan Note (Signed)
Discussed with patient cutting back on narcotics.  He volunteered to decreased from oxycodone-apap 10/500 to [place].  Will keep quantity # 90 the same.  Will refill with voltaren, follow-up in 3 months.  Plan to continue to work with him on lowering dose.  Will address narcotics contract at next visit.

## 2012-04-20 NOTE — Assessment & Plan Note (Signed)
Continue flomax.  Discussed PSA with patient.  Will order at fasting blood draw

## 2012-04-22 ENCOUNTER — Ambulatory Visit: Payer: Medicaid Other

## 2012-05-20 ENCOUNTER — Encounter: Payer: Self-pay | Admitting: Gastroenterology

## 2012-05-30 ENCOUNTER — Ambulatory Visit: Payer: Medicaid Other | Admitting: Family Medicine

## 2012-06-01 ENCOUNTER — Encounter: Payer: Self-pay | Admitting: Family Medicine

## 2012-06-01 ENCOUNTER — Ambulatory Visit (INDEPENDENT_AMBULATORY_CARE_PROVIDER_SITE_OTHER): Payer: Medicaid Other | Admitting: Family Medicine

## 2012-06-01 VITALS — BP 132/84 | HR 84 | Temp 98.2°F | Ht 70.0 in | Wt 163.0 lb

## 2012-06-01 DIAGNOSIS — Z23 Encounter for immunization: Secondary | ICD-10-CM

## 2012-06-01 DIAGNOSIS — G8929 Other chronic pain: Secondary | ICD-10-CM | POA: Insufficient documentation

## 2012-06-01 MED ORDER — OXYCODONE-ACETAMINOPHEN 5-500 MG PO TABS: 1.0000 | ORAL_TABLET | ORAL | Status: DC | PRN

## 2012-06-01 MED ORDER — OXYCODONE-ACETAMINOPHEN 5-500 MG PO CAPS: 1.0000 | ORAL_CAPSULE | ORAL | Status: DC | PRN

## 2012-06-01 NOTE — Progress Notes (Signed)
  Subjective:    Patient ID: David [name], male    DOB: Aug 22, 1956, 56 y.o.   MRN: [id number]  HPIHere for follow-up   Did not return for fasting bloodwork.  Chronic pain:" Has been getting narcotics regularly for the past 6 months for variety of reasons. Had history of right shoulder surgery in 05/2011 now with frozen shoulder. Also has chronic low back pain. 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 Also had hand cellulitis earlier this spring. Feels oxycodone helps him function better without pain. "    At last visit we decreased oxycodone -apap from 10 mg to 5 mg.  He reports doing well but brings up left hip pain that he states has intermittently bothered him since being incarcerated earlier this year.  No giving way, fever, chills, numbness, tingling.   Review of Systemssee hPi     Objective:   Physical Exam GEN: Alert & Oriented, No acute distress CV:  Regular Rate & Rhythm, no murmur Respiratory:  Normal work of breathing, CTAB Abd:  + BS, soft, no tenderness to palpation Ext: no pre-tibial edema MSK;  Normal hip rom,  neg straight leg raise       Assessment & Plan:

## 2012-06-01 NOTE — Assessment & Plan Note (Signed)
Insidious start to chronic narcotics.  He reports having been maximally evaluated by orthopedics for his primary source of pain which is his frozen shoulder and reports 2 courses of physical therapy.  Was able to taper back dose at last visit.  Will continue oxycodone 5/500 #90- given rx for 3 months.  Signed controlled substances contact today.  Will plan on tapering back at next visit, performing drug testing, and continuing to monitor for responsible use.

## 2012-06-01 NOTE — Patient Instructions (Addendum)
You signed controlled medications contract  Today  Make appointment for fasting bloodwork  Your goal is to see how few pills you can use each month.  Will plan on decreasing total number of pills at follow-up in 3 months  If you continue to have hip pain, you should follow-up with your orthopedist

## 2012-08-02 ENCOUNTER — Ambulatory Visit (INDEPENDENT_AMBULATORY_CARE_PROVIDER_SITE_OTHER): Payer: Self-pay | Admitting: Family Medicine

## 2012-08-02 ENCOUNTER — Encounter: Payer: Self-pay | Admitting: Family Medicine

## 2012-08-02 VITALS — BP 122/79 | HR 90 | Temp 98.1°F | Ht 70.0 in | Wt 169.0 lb

## 2012-08-02 DIAGNOSIS — R7301 Impaired fasting glucose: Secondary | ICD-10-CM

## 2012-08-02 DIAGNOSIS — F1410 Cocaine abuse, uncomplicated: Secondary | ICD-10-CM

## 2012-08-02 DIAGNOSIS — G8929 Other chronic pain: Secondary | ICD-10-CM

## 2012-08-02 DIAGNOSIS — E785 Hyperlipidemia, unspecified: Secondary | ICD-10-CM

## 2012-08-02 DIAGNOSIS — R5381 Other malaise: Secondary | ICD-10-CM

## 2012-08-02 DIAGNOSIS — R5383 Other fatigue: Secondary | ICD-10-CM

## 2012-08-02 DIAGNOSIS — N400 Enlarged prostate without lower urinary tract symptoms: Secondary | ICD-10-CM

## 2012-08-02 DIAGNOSIS — M545 Low back pain, unspecified: Secondary | ICD-10-CM

## 2012-08-02 DIAGNOSIS — Z13220 Encounter for screening for lipoid disorders: Secondary | ICD-10-CM

## 2012-08-02 DIAGNOSIS — Z125 Encounter for screening for malignant neoplasm of prostate: Secondary | ICD-10-CM

## 2012-08-02 DIAGNOSIS — M25519 Pain in unspecified shoulder: Secondary | ICD-10-CM

## 2012-08-02 DIAGNOSIS — M25511 Pain in right shoulder: Secondary | ICD-10-CM

## 2012-08-02 DIAGNOSIS — R7989 Other specified abnormal findings of blood chemistry: Secondary | ICD-10-CM

## 2012-08-02 LAB — CBC
Hemoglobin: 12.6 g/dL — ABNORMAL LOW (ref 13.0–17.0)
MCV: 85.3 fL (ref 78.0–100.0)
Platelets: 258 10*3/uL (ref 150–400)
RBC: 4.62 MIL/uL (ref 4.22–5.81)
WBC: 4.2 10*3/uL (ref 4.0–10.5)

## 2012-08-02 MED ORDER — OXYCODONE-ACETAMINOPHEN 5-500 MG PO CAPS: 1.0000 | ORAL_CAPSULE | ORAL | Status: DC | PRN

## 2012-08-02 MED ORDER — CYCLOBENZAPRINE HCL 10 MG PO TABS: 10.0000 mg | ORAL_TABLET | Freq: Three times a day (TID) | ORAL | Status: DC | PRN

## 2012-08-02 NOTE — Progress Notes (Signed)
  Subjective:    Patient ID: David [name], male    DOB: May 10, 1956, 56 y.o.   MRN: [id number]  HPI Patient returns today for further evaluation of chronic shoulder pain  States has worsened in the past month.  No new injury or overuse.  Reports pain "at shoulder blade"  Has not returned to see his surgeon.  Reports oxycodone not helping- has been taking more than prescribed and has run out early.  Requesting next refill to be of stronger dose or higher quantity.  Continues to take voltaren.  Reports some mild GI upset with oxycodone, but states no constipation.  Reports in past has not tolerated hydrocodone due to constipation.  Did not return for fasting bloodwork. Review of Systems See HPI    Objective:   Physical Exam GEN: NAD, poor eye contact Right shoulder: no swelling.  NO pain on palpation anteriorly.  Most of pain is located along trapezius and medial to right shoulder blade.  Rotator cuff strength seems good.  Range of motion not as limited as previously.       Assessment & Plan:

## 2012-08-02 NOTE — Assessment & Plan Note (Signed)
No evidence of new tear or acute surgical injury but given worsening symptoms, I recommended he follow-up with his orthopedist at [facility] for further evaluation.  I declined to increase narcotic pain medicine today- will add on flexeril as areas of pain seem more related to muscle spasm.

## 2012-08-02 NOTE — Patient Instructions (Addendum)
Flexeril for muscle spasm- caution- can make you drowsy  I recommend you follow-up with the orthopedist who did your surgery earlier this year.

## 2012-08-02 NOTE — Assessment & Plan Note (Signed)
Will check urine drug screen today

## 2012-08-03 LAB — DRUG SCREEN, URINE
Amphetamine Screen, Ur: NEGATIVE
Barbiturate Quant, Ur: NEGATIVE
Cocaine Metabolites: POSITIVE — AB
Creatinine,U: 243.69 mg/dL
Marijuana Metabolite: NEGATIVE
Opiates: NEGATIVE
Phencyclidine (PCP): NEGATIVE

## 2012-08-03 LAB — LIPID PANEL
Total CHOL/HDL Ratio: 5.6 Ratio
VLDL: 37 mg/dL (ref 0–40)

## 2012-08-03 LAB — PSA: PSA: 1.81 ng/mL (ref <=4.00)

## 2012-08-03 LAB — COMPREHENSIVE METABOLIC PANEL
Albumin: 4.1 g/dL (ref 3.5–5.2)
Alkaline Phosphatase: 135 U/L — ABNORMAL HIGH (ref 39–117)
Calcium: 9.4 mg/dL (ref 8.4–10.5)
Chloride: 101 mEq/L (ref 96–112)
Glucose, Bld: 138 mg/dL — ABNORMAL HIGH (ref 70–99)
Potassium: 4.1 mEq/L (ref 3.5–5.3)
Sodium: 141 mEq/L (ref 135–145)
Total Protein: 8.3 g/dL (ref 6.0–8.3)

## 2012-08-05 ENCOUNTER — Telehealth: Payer: Self-pay | Admitting: Family Medicine

## 2012-08-05 NOTE — Telephone Encounter (Signed)
Left message asking to return call to discuss lab results

## 2012-08-08 DIAGNOSIS — F1410 Cocaine abuse, uncomplicated: Secondary | ICD-10-CM | POA: Insufficient documentation

## 2012-08-08 DIAGNOSIS — R7989 Other specified abnormal findings of blood chemistry: Secondary | ICD-10-CM | POA: Insufficient documentation

## 2012-08-08 DIAGNOSIS — E785 Hyperlipidemia, unspecified: Secondary | ICD-10-CM | POA: Insufficient documentation

## 2012-08-08 DIAGNOSIS — R7301 Impaired fasting glucose: Secondary | ICD-10-CM | POA: Insufficient documentation

## 2012-08-08 NOTE — Assessment & Plan Note (Signed)
Positive uds, patient reported "being exposed" to cocaine.  Not upfront about use or pattern. No further counseling provided.

## 2012-08-08 NOTE — Telephone Encounter (Signed)
Discussed lab results with patient to include hyperlipidemia, mild LFT elevation, hyperglycemia.  Discussed cocaine positive, with opioid negative or drug screen and confirmatory test.  Patient does not dispute.  Discussed no longer providing pain management, but will continue primary care.  See problem list under office note for full plans.

## 2012-08-08 NOTE — Assessment & Plan Note (Signed)
Slightly elevated LFt, patient denies alcohol use or tylenol use above oxycodone.  I recommended we recheck this when he returns to recheck elevated fasting glucose.  He is in agreement.

## 2012-08-08 NOTE — Addendum Note (Signed)
Addended by: Katherina Mires on: 08/08/2012 11:54 AM   Modules accepted: Orders, Medications

## 2012-08-08 NOTE — Assessment & Plan Note (Signed)
Patient confirms appropriately fasting for that bloodwokr.  I recommended he return for repeat test.

## 2012-08-08 NOTE — Assessment & Plan Note (Signed)
LDl markedly elevated at 172.  Recommend statin therapy. Patient declines at this time.

## 2012-08-16 ENCOUNTER — Ambulatory Visit (INDEPENDENT_AMBULATORY_CARE_PROVIDER_SITE_OTHER): Payer: Medicare Other | Admitting: Family Medicine

## 2012-08-16 ENCOUNTER — Encounter: Payer: Self-pay | Admitting: Family Medicine

## 2012-08-16 VITALS — BP 128/83 | HR 83 | Ht 70.0 in | Wt 169.0 lb

## 2012-08-16 DIAGNOSIS — M25519 Pain in unspecified shoulder: Secondary | ICD-10-CM

## 2012-08-16 DIAGNOSIS — M542 Cervicalgia: Secondary | ICD-10-CM | POA: Insufficient documentation

## 2012-08-16 NOTE — Progress Notes (Signed)
Chief complaint: Right shoulder/neck pain. History of present illness: Patient is a 56 year old male with a past medical history significant for rotator cuff repair in 2012 [place]. Patient has been seen by her primary care provider who has given individual muscle relaxants for the pain but he has not filled it yet. Patient states the pain seems to be constant and points more to the shoulder blade medial aspect in the true shoulder. Patient states that he does have some radiation to the neck as well. Patient states that most of this pain is worse with movement of the neck and actually movement of the shoulder. Patient did not do formal physical therapy for the shoulder. This is why he is having some trouble with movement. Patient's past medical history is significant for cocaine abuse and chronic pain which is just lost privileges of his narcotics.  Past Medical History  Diagnosis Date  . Arthritis    Past Surgical History  Procedure Date  . Shoulder arthroscopy w/ rotator cuff repair 10/21/10    At [place], R shoulder   No family history on file.  History   Social History  . Marital Status: Single    Spouse Name: N/A    Number of Children: N/A  . Years of Education: N/A   Occupational History  . Not on file.   Social History Main Topics  . Smoking status: Never Smoker   . Smokeless tobacco: Never Used  . Alcohol Use: Yes     Comment: occasion  . Drug Use: No  . Sexually Active: Not on file   Other Topics Concern  . Not on file   Social History Narrative  . No narrative on file   Physical exam Blood pressure 128/83, pulse 83, height 5\' 10"  (1.778 m), weight 169 lb (76.658 kg). General: No apparent distress alert and oriented x3 mood and affect somewhat normal but blunted. Respiratory: Patient's recent full sentences and does not appear short of breath Skin: Warm dry intact with no signs of infection or rash Neuro: Cranial nerves II through XII are intact,  neurovascularly intact in all extremities with 2+ DTRs and 2+ pulses. Shoulder: Right Inspection reveals no abnormalities, atrophy or asymmetry. Incision well healed.  Palpation is normal with no tenderness over AC joint or bicipital groove. ROM is near full in all planes passively.  Rotator cuff strength normal throughout. Mild signs of impingement with Neer and Hawkin's tests, empty can. Speeds and Yergason's tests normal. No labral pathology noted with negative Obrien's, negative clunk and good stability. Normal scapular function observed. No painful arc and no drop arm sign. No apprehension sign Tender to palpation at the superior medial border of the scapula. This appears to be insertion of the trapezius and rhomboid muscle. This this appears to be a trigger point. Cervical neck exam: Patient does have restriction in movement and extension as well as right rotation. Patient does have a positive Spurling's.  After verbal and written consent patient was prepped with to alcohol swabs and was injected with a 22-gauge 5/8 needle with 1 cc of half Marcaine and 1 cc of Kenalog 10 mg/dL. Patient tolerated the procedure well with no bleeding and was prepped with Band-Aid. Patient did have some resolution of his pain immediately.

## 2012-08-16 NOTE — Patient Instructions (Signed)
Good to see you.  We tried a trigger point injection today so I hope it helps. Lets get an x ray of your cervical neck. Try the flexeril. Come back and see me in 4 weeks to make sure you are improving or can come sooner if you need help.

## 2012-08-16 NOTE — Assessment & Plan Note (Signed)
We'll get x-ray of neck to rule out osteoarthritis. If patient does have arthritis would consider starting nortriptyline. Will call patient wants to get results.

## 2012-08-16 NOTE — Assessment & Plan Note (Signed)
Patient did have injection as above. Patient likely could respond to osteopathic manipulation in the future if necessary. Patient will get cervical x-rays though secondary to restriction in motion to rule out osteoarthritis. Patient will try flexeril and will continue to monitor.  Have patient back in 4-6 weeks.

## 2012-09-20 ENCOUNTER — Ambulatory Visit: Payer: Medicare Other | Admitting: Family Medicine

## 2012-12-06 ENCOUNTER — Other Ambulatory Visit: Payer: Self-pay | Admitting: Family Medicine

## 2012-12-20 ENCOUNTER — Telehealth: Payer: Self-pay | Admitting: Family Medicine

## 2012-12-20 NOTE — Telephone Encounter (Signed)
Placed in fax pile. 

## 2012-12-20 NOTE — Telephone Encounter (Signed)
Patient is asking for permission to decrease the    diclofenac   Number of times per day to 1 x per day at least for the duration of his stay at [facility] which will be until May 9th.  He just need the ok faxed to Jorene Minors at [phone].

## 2012-12-21 ENCOUNTER — Other Ambulatory Visit: Payer: Self-pay | Admitting: Family Medicine

## 2012-12-27 ENCOUNTER — Ambulatory Visit (INDEPENDENT_AMBULATORY_CARE_PROVIDER_SITE_OTHER): Payer: Medicare Other | Admitting: Family Medicine

## 2012-12-27 ENCOUNTER — Encounter: Payer: Self-pay | Admitting: Family Medicine

## 2012-12-27 VITALS — BP 132/88 | HR 97 | Temp 97.8°F | Ht 70.0 in | Wt 178.2 lb

## 2012-12-27 DIAGNOSIS — J302 Other seasonal allergic rhinitis: Secondary | ICD-10-CM

## 2012-12-27 DIAGNOSIS — J309 Allergic rhinitis, unspecified: Secondary | ICD-10-CM

## 2012-12-27 MED ORDER — FLUTICASONE PROPIONATE 50 MCG/ACT NA SUSP: 2.0000 | Freq: Every day | NASAL | Status: DC

## 2012-12-27 MED ORDER — DESLORATADINE 5 MG PO TABS: 5.0000 mg | ORAL_TABLET | Freq: Every day | ORAL | Status: DC

## 2012-12-27 NOTE — Assessment & Plan Note (Signed)
Clarinex and Flonase for relief. Prescriptions provided. FU prn if no improvement or worsening.

## 2012-12-27 NOTE — Progress Notes (Signed)
Subjective:    David [name] is a 57 y.o. male who presents to [facility] today with complaints of seasonal allergies:  1.  Seasonal allergies:  Chronic problem for patient.  Worse during the spring.  Lives at rehab facilty for substance abuse currently and therefore is not allowed to take any OTC medications that contain pseudoephedrine. He therefore has not been able to take anything over-the-counter for relief. He states he'll need a prescription for new medications.  Describes runny, itchy eyes, nasal congestion, and sinus congestion.  No headaches.  Difficulty sleeping at night secondary to congestion.   No cough.  No fevers or chills.  No nausea or vomiting.     The following portions of the patient's history were reviewed and updated as appropriate: allergies, current medications, past medical history, family and social history, and problem list. Patient is a nonsmoker.   PMH reviewed.  Past Medical History  Diagnosis Date  . Arthritis    Past Surgical History  Procedure Laterality Date  . Shoulder arthroscopy w/ rotator cuff repair  10/21/10    At [place], R shoulder    Medications reviewed. Current Outpatient Prescriptions  Medication Sig Dispense Refill  . Aspirin (ADULT ASPIRIN LOW STRENGTH) 81 MG EC tablet Take 1 tablet (81 mg total) by mouth daily.  90 tablet  3  . cyclobenzaprine (FLEXERIL) 10 MG tablet TAKE 1 TABLET (10 MG TOTAL) BY MOUTH 3 (THREE) TIMES DAILY AS NEEDED FOR MUSCLE SPASMS.  60 tablet  0  . desloratadine (CLARINEX) 5 MG tablet Take 1 tablet (5 mg total) by mouth daily.  30 tablet  3  . diclofenac (VOLTAREN) 75 MG EC tablet Take 1 tablet (75 mg total) by mouth 2 (two) times daily.  60 tablet  6  . fluticasone (FLONASE) 50 MCG/ACT nasal spray Place 2 sprays into the nose daily.  16 g  6  . loratadine-pseudoephedrine (CLARITIN-D 24 HOUR) 10-240 MG per 24 hr tablet Take 1 tablet by mouth daily.  30 tablet  5  . oxyCODONE-acetaminophen (PERCOCET) 10-325 MG per tablet Take  1 tablet by mouth every 8 (eight) hours as needed for pain.  90 tablet  0  . Tamsulosin HCl (FLOMAX) 0.4 MG CAPS Take 1 capsule (0.4 mg total) by mouth daily.  30 capsule  6   No current facility-administered medications for this visit.    ROS as above otherwise neg.  No chest pain, palpitations, SOB, Fever, Chills, Abd pain, N/V/D.   Objective:  BP 132/88  Pulse 97  Temp(Src) 97.8 F (36.6 C) (Oral)  Ht 5\' 10"  (1.778 m)  Wt 178 lb 3.2 oz (80.831 kg)  BMI 25.57 kg/m2 Gen:  Patient sitting on exam table, appears stated age in no acute distress Head: Normocephalic atraumatic Eyes: EOMI, PERRL, sclera and conjunctiva non-erythematous.   Ears:  Canals clear bilaterally.  TMs pearly gray bilaterally without erythema or bulging.   Nose:  Nasal turbinates boggy and enlarged BL.   Mouth: Mucosa membranes moist. Tonsils +2, nonenlarged, non-erythematous. Neck: No cervical lymphadenopathy noted Heart:  RRR, no murmurs auscultated. Pulm:  Clear to auscultation bilaterally with good air movement.  No wheezes or rales noted.

## 2012-12-27 NOTE — Patient Instructions (Signed)
Take the Clarinex once daily for allergy relief.  Use the FLonase 2 sprays in each nostril every morning.  It was good to meet you today

## 2013-01-26 ENCOUNTER — Encounter: Payer: Self-pay | Admitting: Gastroenterology

## 2013-03-17 ENCOUNTER — Ambulatory Visit (INDEPENDENT_AMBULATORY_CARE_PROVIDER_SITE_OTHER): Payer: Medicare Other | Admitting: Sports Medicine

## 2013-03-17 VITALS — BP 124/87 | Ht 70.0 in | Wt 169.0 lb

## 2013-03-17 DIAGNOSIS — M25512 Pain in left shoulder: Secondary | ICD-10-CM

## 2013-03-17 DIAGNOSIS — M25519 Pain in unspecified shoulder: Secondary | ICD-10-CM

## 2013-03-17 NOTE — Patient Instructions (Addendum)
We did not see any problems with your rotator cuff on ultrasound today We are concerned about the labrum in your shoulder We will get an MRI arthrogram of your left shoulder Take ibuprofen or aleve as needed

## 2013-03-17 NOTE — Progress Notes (Signed)
CC: Left shoulder pain HPI: Patient is a very pleasant 57 year old male who presents with left shoulder pain. We previously saw him for pain in his right shoulder and he received a subacromial injection with significant relief. He presents today with left shoulder pain for the last one to one and a half months. He states that this happened acutely when he was reaching into the backseat of his car and had sudden pain in the front of the shoulder. He says that the pain is in the anterior aspect of the shoulder and now he has pain with any reaching activity in all directions. He feels like his symptoms are worsening. Pain is sharp in nature. He has no pain at rest. No numbness or radicular symptoms. He cannot sleep on his left side now the pain has gotten worse. Pain is different in nature to what he experienced with his previous right shoulder issue.  ROS: As above in the HPI. All other systems are stable or negative.  OBJECTIVE: APPEARANCE:  Patient in no acute distress.The patient appeared well nourished and normally developed. HEENT: No scleral icterus. Conjunctiva non-injected Resp: Non labored Skin: No rash MSK:  Left Shoulder - No swelling or deformity - No TTP over [facility] joint or lateral humerus. Tender in the area of the biceps tendon -Full range of motion in forward flexion. Decreased abduction to 100. Normal external rotation with some discomfort. Normal internal rotation. - Strength 5/5 on shoulder abduction, internal rotation, external rotation, empty can - Negative empty can. Impingement testing creates pain - Labrum: Positive O'Briens -Neurovascularly intact distally  MSK [place]: Left shoulder: MSK ultrasound was obtained. Long and short axis images were reviewed. Visualized portions of the biceps tendon, subscapularis, supraspinatus, infraspinatus were all within normal limits with no evidence of hypoechogenicity or increased neovascularity.  ASSESSMENT: Left shoulder pain, concern  for labral tear  PLAN: Based on patient's acute injury, persistent pain, and anterior location of the pain with normal rotator cuff on shoulder ultrasound, most concern for labral tear at this time. We will get the patient scheduled for an MR arthrogram of his left shoulder. He will followup with [place] after his study is done.

## 2013-03-27 ENCOUNTER — Ambulatory Visit
Admission: RE | Admit: 2013-03-27 | Discharge: 2013-03-27 | Disposition: A | Discharge status: Home / Self Care | Payer: Medicare Other | Source: Ambulatory Visit | Attending: Family Medicine | Admitting: Family Medicine

## 2013-03-27 DIAGNOSIS — M25512 Pain in left shoulder: Secondary | ICD-10-CM

## 2013-03-27 MED ORDER — IOHEXOL 180 MG/ML  SOLN
15.0000 mL | Freq: Once | INTRAMUSCULAR | Status: AC | PRN
  Administered 2013-03-27: 15 mL via INTRA_ARTICULAR

## 2013-03-28 ENCOUNTER — Telehealth: Payer: Self-pay | Admitting: Family Medicine

## 2013-03-28 ENCOUNTER — Encounter: Payer: Self-pay | Admitting: *Deleted

## 2013-03-28 NOTE — Telephone Encounter (Signed)
Called patient to discuss MRI results showing left labral tear. All questions answered. Patient given appt with Dr. Tamera Punt at [facility] for Aug 6 at 2:30pm.

## 2013-03-28 NOTE — Patient Instructions (Addendum)
DR Bretta Bang [place] [facility] AT 2:30P [phone]

## 2013-07-08 ENCOUNTER — Other Ambulatory Visit: Payer: Self-pay | Admitting: Family Medicine

## 2013-08-07 ENCOUNTER — Other Ambulatory Visit: Payer: Self-pay | Admitting: Family Medicine

## 2013-08-17 ENCOUNTER — Ambulatory Visit (INDEPENDENT_AMBULATORY_CARE_PROVIDER_SITE_OTHER): Payer: Medicare Other | Admitting: Sports Medicine

## 2013-08-17 ENCOUNTER — Encounter: Payer: Self-pay | Admitting: Sports Medicine

## 2013-08-17 VITALS — BP 144/85 | HR 81 | Ht 70.0 in | Wt 180.0 lb

## 2013-08-17 DIAGNOSIS — M25561 Pain in right knee: Secondary | ICD-10-CM

## 2013-08-17 DIAGNOSIS — M25569 Pain in unspecified knee: Secondary | ICD-10-CM

## 2013-08-17 DIAGNOSIS — S43432D Superior glenoid labrum lesion of left shoulder, subsequent encounter: Secondary | ICD-10-CM

## 2013-08-17 DIAGNOSIS — Z5189 Encounter for other specified aftercare: Secondary | ICD-10-CM

## 2013-08-17 MED ORDER — METHYLPREDNISOLONE ACETATE 40 MG/ML IJ SUSP
40.0000 mg | Freq: Once | INTRAMUSCULAR | Status: AC
  Administered 2013-08-17: 40 mg via INTRA_ARTICULAR

## 2013-08-17 NOTE — Progress Notes (Signed)
Family Medicine Office Visit Note   Subjective:   Patient ID: David [name], male  DOB: 05/05/1956, 57 y.o.. MRN: [id number]   Pt that comes today complaining of left shoulder and right knee pain. Recent workup of the left shoulder including an MRI arthrogram showed a rather sizable anterior labral tear and a longitudinal intrasubstance tear of the subscapularis tendon. A referral to Dr. Tamera Punt was made. Dr. Tamera Punt had recommended the patient try some physical therapy prior to surgery. Unfortunately, after that office visit, the patient was incarcerated for 90 days. When he was released he found out that he would not be able to return to Dr. Tamera Punt or to their physical therapy Department until he paid an outstanding bill. Therefore, he has not had any real treatment for this shoulder. In regards to the right knee he states that while playing basketball he felt the knee "give way". No swelling. Since then he has had pain mainly along the posterior lateral aspect of the knee. It is most noticeable with twisting. No locking or catching. He was placed on meloxicam while incarcerated but this was discontinued after it was discovered that it was raising his liver enzymes. He has a history of a remote knee arthroscopy in this same knee.  Interim medical history and current medications are reviewed   Objective:   Physical Exam: Gen: Well-developed, well-nourished. No acute distress Left shoulder: Limited range of motion secondary to pain. Limited passive external rotation to 60. This is compared to a full 90 on the uninvolved right side. Patient has pain with empty can and with Hawkins testing. Positive O'Brien's. Neurovascular intact distally.  Right knee: Range of motion is 0-120. This is compared to 0 -140 on the uninvolved left side. No effusion. There is tenderness to palpation along the posterior lateral joint line. Negative McMurray's but a positive Thessaly's. No tenderness to palpation along  the medial joint line. Negative Lachmans, negative anterior drawer. Negative posterior drawer. Knee is stable to valgus and varus stressing. Neurovascularly intact distally. Walking with a slight limp.  MRI arthrogram of the left shoulder done on 03/27/2013 shows a sizable anterior labral tear from the 6:00 to the 10:00 position. There is also a concomitant longitudinal intrasubstance tear of the subscapularis tendon. Remainder of the rotator cuff appears to be within normal limits.  Assessment & Plan:   1. Left shoulder pain secondary to labral tear with probable early adhesive capsulitis 2. Right knee pain secondary to DJD versus degenerative meniscal tear  I will send the patient for some limited physical therapy to [facility] for his left shoulder. I am not overly optimistic that this will help. I explained to the patient that he needs to return to Dr. Tamera Punt if possible for further treatment. For his right knee I've elected to inject his knee with cortisone today. We will get some x-rays of the knee as well to evaluate the degree of osteoarthritis present. Patient will followup with me in 3 weeks.   Consent obtained and verified. Time-out conducted. Noted no overlying erythema, induration, or other signs of local infection. Skin prepped in a sterile fashion. Topical analgesic spray: Ethyl chloride. Joint: right knee Needle: 25g 1.5 inch Completed without difficulty. Meds: 3cc 1% xylocaine, 1cc (40mg ) depomedrol  Advised to call if fevers/chills, erythema, induration, drainage, or persistent bleeding.

## 2013-08-17 NOTE — Patient Instructions (Signed)
Knee Injection AFTER THE PROCEDURE   You can go home after the procedure.  You may need to put ice on the joint 15-20 minutes every 3 or 4 hours until the pain goes away.  You may need to put an elastic bandage on the joint. HOME CARE INSTRUCTIONS   Only take over-the-counter or prescription medicines for pain, discomfort, or fever as directed by your caregiver.  You should avoid stressing the joint. Unless advised otherwise, avoid activities that put a lot of pressure on a knee joint, such as:  Jogging.  Bicycling.  Recreational climbing.  Hiking.  Laying down and elevating the leg/knee above the level of your heart can help to minimize swelling. SEEK MEDICAL CARE IF:   You have repeated or worsening swelling.  There is drainage from the puncture area.  You develop red streaking that extends above or below the site where the needle was inserted. SEEK IMMEDIATE MEDICAL CARE IF:   You develop a fever.  You have pain that gets worse even though you are taking pain medicine.  The area is red and warm, and you have trouble moving the joint. MAKE SURE YOU:   Understand these instructions.  Will watch your condition.  Will get help right away if you are not doing well or get worse.  We have also recommended Physical Therapy for your shoulder. F/u in 3 weeks.

## 2013-08-18 ENCOUNTER — Ambulatory Visit: Payer: Medicare Other | Attending: Sports Medicine

## 2013-08-18 ENCOUNTER — Ambulatory Visit
Admission: RE | Admit: 2013-08-18 | Discharge: 2013-08-18 | Disposition: A | Discharge status: Home / Self Care | Payer: Medicare Other | Source: Ambulatory Visit | Attending: Sports Medicine | Admitting: Sports Medicine

## 2013-08-18 DIAGNOSIS — M6281 Muscle weakness (generalized): Secondary | ICD-10-CM | POA: Insufficient documentation

## 2013-08-18 DIAGNOSIS — M25619 Stiffness of unspecified shoulder, not elsewhere classified: Secondary | ICD-10-CM | POA: Insufficient documentation

## 2013-08-18 DIAGNOSIS — M25519 Pain in unspecified shoulder: Secondary | ICD-10-CM | POA: Insufficient documentation

## 2013-08-18 DIAGNOSIS — IMO0001 Reserved for inherently not codable concepts without codable children: Secondary | ICD-10-CM | POA: Insufficient documentation

## 2013-08-18 DIAGNOSIS — M25561 Pain in right knee: Secondary | ICD-10-CM

## 2013-08-22 ENCOUNTER — Telehealth: Payer: Self-pay | Admitting: Family Medicine

## 2013-08-22 ENCOUNTER — Telehealth: Payer: Self-pay | Admitting: *Deleted

## 2013-08-22 NOTE — Telephone Encounter (Signed)
Pt would like a prescription of pain medication for his shoulder pain. He is going to [facility] and I suggested he call there since he has not been seen since 4/14. jw

## 2013-08-22 NOTE — Telephone Encounter (Signed)
Pt called requesting pain medication for his shoulder.  States he cannot take meloxicam because it elevates his LFTs, and has tried tramadol in the past, but it did not help.  Per Dr. Oneida Alar we will not prescribe anything stronger, suggested he call his PCP regarding this request.  Pt agreeable.

## 2013-08-29 ENCOUNTER — Ambulatory Visit: Payer: Medicare Other | Admitting: Rehabilitation

## 2013-09-04 ENCOUNTER — Encounter (HOSPITAL_COMMUNITY): Payer: Self-pay | Admitting: Emergency Medicine

## 2013-09-04 ENCOUNTER — Ambulatory Visit: Payer: Medicare Other

## 2013-09-04 ENCOUNTER — Ambulatory Visit: Payer: Medicare Other | Admitting: Sports Medicine

## 2013-09-04 ENCOUNTER — Emergency Department (HOSPITAL_COMMUNITY)
Admission: EM | Admit: 2013-09-04 | Discharge: 2013-09-04 | Disposition: A | Discharge status: Home / Self Care | Payer: Medicare Other | Attending: Emergency Medicine | Admitting: Emergency Medicine

## 2013-09-04 DIAGNOSIS — L03211 Cellulitis of face: Secondary | ICD-10-CM | POA: Insufficient documentation

## 2013-09-04 DIAGNOSIS — IMO0002 Reserved for concepts with insufficient information to code with codable children: Secondary | ICD-10-CM | POA: Insufficient documentation

## 2013-09-04 DIAGNOSIS — Z791 Long term (current) use of non-steroidal anti-inflammatories (NSAID): Secondary | ICD-10-CM | POA: Insufficient documentation

## 2013-09-04 DIAGNOSIS — Z7982 Long term (current) use of aspirin: Secondary | ICD-10-CM | POA: Insufficient documentation

## 2013-09-04 DIAGNOSIS — M129 Arthropathy, unspecified: Secondary | ICD-10-CM | POA: Insufficient documentation

## 2013-09-04 DIAGNOSIS — L0201 Cutaneous abscess of face: Secondary | ICD-10-CM

## 2013-09-04 DIAGNOSIS — Z79899 Other long term (current) drug therapy: Secondary | ICD-10-CM | POA: Insufficient documentation

## 2013-09-04 DIAGNOSIS — L02411 Cutaneous abscess of right axilla: Secondary | ICD-10-CM

## 2013-09-04 MED ORDER — SULFAMETHOXAZOLE-TRIMETHOPRIM 800-160 MG PO TABS: 1.0000 | ORAL_TABLET | Freq: Two times a day (BID) | ORAL | Status: AC

## 2013-09-04 MED ORDER — MUPIROCIN CALCIUM 2 % NA OINT: TOPICAL_OINTMENT | NASAL | Status: DC

## 2013-09-04 NOTE — ED Provider Notes (Signed)
Medical screening examination/treatment/procedure(s) were performed by non-physician practitioner and as supervising physician I was immediately available for consultation/collaboration.  EKG Interpretation   None         Saddie Benders. Dorna Mai, MD 09/04/13 [phone]

## 2013-09-04 NOTE — ED Notes (Signed)
Patient presents stating he has several absess - 1 under his right arm, right side of his face, 1 right nostril

## 2013-09-04 NOTE — ED Notes (Signed)
Pt in c/o abscess to right axilla area, also to right cheek and right nare developing over the last two days

## 2013-09-04 NOTE — Discharge Instructions (Signed)
Apply warm compresses to both your armpit and face on and off throughout the day. Change the dressing twice daily. Take antibiotic to completion.  Abscess An abscess is an infected area that contains a collection of pus and debris.It can occur in almost any part of the body. An abscess is also known as a furuncle or boil. CAUSES  An abscess occurs when tissue gets infected. This can occur from blockage of oil or sweat glands, infection of hair follicles, or a minor injury to the skin. As the body tries to fight the infection, pus collects in the area and creates pressure under the skin. This pressure causes pain. People with weakened immune systems have difficulty fighting infections and get certain abscesses more often.  SYMPTOMS Usually an abscess develops on the skin and becomes a painful mass that is red, warm, and tender. If the abscess forms under the skin, you may feel a moveable soft area under the skin. Some abscesses break open (rupture) on their own, but most will continue to get worse without care. The infection can spread deeper into the body and eventually into the bloodstream, causing you to feel ill.  DIAGNOSIS  Your caregiver will take your medical history and perform a physical exam. A sample of fluid may also be taken from the abscess to determine what is causing your infection. TREATMENT  Your caregiver may prescribe antibiotic medicines to fight the infection. However, taking antibiotics alone usually does not cure an abscess. Your caregiver may need to make a small cut (incision) in the abscess to drain the pus. In some cases, gauze is packed into the abscess to reduce pain and to continue draining the area. HOME CARE INSTRUCTIONS   Only take over-the-counter or prescription medicines for pain, discomfort, or fever as directed by your caregiver.  If you were prescribed antibiotics, take them as directed. Finish them even if you start to feel better.  If gauze is used, follow  your caregiver's directions for changing the gauze.  To avoid spreading the infection:  Keep your draining abscess covered with a bandage.  Wash your hands well.  Do not share personal care items, towels, or whirlpools with others.  Avoid skin contact with others.  Keep your skin and clothes clean around the abscess.  Keep all follow-up appointments as directed by your caregiver. SEEK MEDICAL CARE IF:   You have increased pain, swelling, redness, fluid drainage, or bleeding.  You have muscle aches, chills, or a general ill feeling.  You have a fever. MAKE SURE YOU:   Understand these instructions.  Will watch your condition.  Will get help right away if you are not doing well or get worse. Document Released: 05/27/2005 Document Revised: 02/16/2012 Document Reviewed: 10/30/2011 [facility] Patient Information 2014 [place].

## 2013-09-04 NOTE — ED Provider Notes (Signed)
CSN: [id number]     Arrival date & time 09/04/13  0256 History   First MD Initiated Contact with Patient 09/04/13 [phone]     Chief Complaint  Patient presents with  . Abscess   (Consider location/radiation/quality/duration/timing/severity/associated sxs/prior Treatment) HPI Comments: Pt is a 58 y/o male who presents to the ED complaining of an abscess to his right axilla and right side of face x 1 week, both increasing in size. No drainage. He has applied warm compresses without relief. Also feels as if he has an abscess in the right side of his nose developing over the past 2 days. Denies fever, difficulty breathing or swallowing.  Patient is a 58 y.o. male presenting with abscess. The history is provided by the patient.  Abscess Associated symptoms: no fever     Past Medical History  Diagnosis Date  . Arthritis    Past Surgical History  Procedure Laterality Date  . Shoulder arthroscopy w/ rotator cuff repair  10/21/10    At [place], R shoulder   History reviewed. No pertinent family history. History  Substance Use Topics  . Smoking status: Never Smoker   . Smokeless tobacco: Never Used  . Alcohol Use: Yes     Comment: occasion    Review of Systems  Constitutional: Negative for fever.  HENT: Negative for facial swelling and trouble swallowing.   Skin:       Positive for absess    Allergies  Review of patient's allergies indicates no known allergies.  Home Medications   Current Outpatient Rx  Name  Route  Sig  Dispense  Refill  . Aspirin (ADULT ASPIRIN LOW STRENGTH) 81 MG EC tablet   Oral   Take 1 tablet (81 mg total) by mouth daily.   90 tablet   3   . diclofenac (VOLTAREN) 75 MG EC tablet   Oral   Take 75 mg by mouth 2 (two) times daily.         . Tamsulosin HCl (FLOMAX) 0.4 MG CAPS   Oral   Take 1 capsule (0.4 mg total) by mouth daily.   30 capsule   6   . sulfamethoxazole-trimethoprim (BACTRIM DS,SEPTRA DS) 800-160 MG per tablet   Oral   Take 1 tablet by  mouth 2 (two) times daily.   14 tablet   0    BP 126/83  Pulse 82  Temp(Src) 98.1 F (36.7 C) (Oral)  Resp 18  SpO2 97% Physical Exam  Nursing note and vitals reviewed. Constitutional: He is oriented to person, place, and time. He appears well-developed and well-nourished. No distress.  HENT:  Head: Normocephalic and atraumatic.  Nose: Nose normal.  Mouth/Throat: Oropharynx is clear and moist.  No abscess visualized.  Eyes: Conjunctivae are normal.  Neck: Normal range of motion. Neck supple.  Cardiovascular: Normal rate, regular rhythm and normal heart sounds.   Pulmonary/Chest: Effort normal and breath sounds normal.  Musculoskeletal: Normal range of motion. He exhibits no edema.  Lymphadenopathy:    He has no cervical adenopathy.  Neurological: He is alert and oriented to person, place, and time.  Skin: Skin is warm and dry. He is not diaphoretic.  2 cm diameter area of induration with central pustule under right axilla with fluctuance surrounding. No surrounding cellulitis. 1 cm diameter area of induration with central pustule on right cheek, tender, no drainage or surrounding cellulitis.  Psychiatric: He has a normal mood and affect. His behavior is normal.    ED Course  Procedures (including critical  care time) INCISION AND DRAINAGE Performed by: Michele Mcalpine Consent: Verbal consent obtained. Risks and benefits: risks, benefits and alternatives were discussed Type: abscess  Body area: right axilla  Anesthesia: local infiltration  Incision was made with a scalpel.  Local anesthetic: lidocaine 2% with epinephrine  Anesthetic total: 1.5 ml  Complexity: complex Blunt dissection to break up loculations  Drainage: purulent  Drainage amount: large  Packing material: none  Patient tolerance: Patient tolerated the procedure well with no immediate complications.  INCISION AND DRAINAGE Performed by: Michele Mcalpine Consent: Verbal consent obtained. Risks and  benefits: risks, benefits and alternatives were discussed Type: abscess  Body area: right side of face  Anesthesia: local infiltration  Incision was made with a scalpel.  Local anesthetic: lidocaine 2% with epinephrine  Anesthetic total: 1 ml  Complexity: simple   Drainage: purulent  Drainage amount: small  Packing material: none  Patient tolerance: Patient tolerated the procedure well with no immediate complications.       Labs Review Labs Reviewed - No data to display Imaging Review No results found.  EKG Interpretation   None       MDM   1. Abscess of axilla, right   2. Facial abscess    Pt with abscess right axilla and right side of face. No surrounding cellulitis. Abscesses drained. No abscess in nare visualized. Bactrim prescribed due to multiple, and possibility of dissemination into nose. Bactroban for nose. Return precautions given. Patient states understanding of treatment care plan and is agreeable.    Illene Labrador, PA-C 09/04/13 [phone]

## 2013-09-06 ENCOUNTER — Encounter: Payer: Self-pay | Admitting: Family Medicine

## 2013-09-06 ENCOUNTER — Ambulatory Visit: Payer: Medicare Other | Attending: Sports Medicine | Admitting: Physical Therapy

## 2013-09-06 ENCOUNTER — Ambulatory Visit (INDEPENDENT_AMBULATORY_CARE_PROVIDER_SITE_OTHER): Payer: Medicare Other | Admitting: Family Medicine

## 2013-09-06 VITALS — BP 129/82 | HR 82 | Temp 97.4°F | Wt 189.0 lb

## 2013-09-06 DIAGNOSIS — M25519 Pain in unspecified shoulder: Secondary | ICD-10-CM | POA: Insufficient documentation

## 2013-09-06 DIAGNOSIS — M25619 Stiffness of unspecified shoulder, not elsewhere classified: Secondary | ICD-10-CM | POA: Insufficient documentation

## 2013-09-06 DIAGNOSIS — L0291 Cutaneous abscess, unspecified: Secondary | ICD-10-CM | POA: Insufficient documentation

## 2013-09-06 DIAGNOSIS — L0390 Cellulitis, unspecified: Secondary | ICD-10-CM

## 2013-09-06 DIAGNOSIS — M6281 Muscle weakness (generalized): Secondary | ICD-10-CM | POA: Insufficient documentation

## 2013-09-06 DIAGNOSIS — IMO0001 Reserved for inherently not codable concepts without codable children: Secondary | ICD-10-CM | POA: Insufficient documentation

## 2013-09-06 DIAGNOSIS — Z23 Encounter for immunization: Secondary | ICD-10-CM

## 2013-09-06 MED ORDER — CHLORHEXIDINE GLUCONATE 4 % EX LIQD: Freq: Every day | CUTANEOUS | Status: DC | PRN

## 2013-09-06 NOTE — Assessment & Plan Note (Addendum)
Resolving  Cont bactrim Continue mupirocin under axilla until closes hibiclens prn and education on hygiene and future abscess treatment due to h/o recurrent abscesses.

## 2013-09-06 NOTE — Patient Instructions (Signed)
You are getting better. Please continue to use the antibiotic ointment under the arm until the wound stops leaking Please continue your bactrim until it is completed Please use wamr compresses, mupriocin ointment in the future for abscesses/sores Please also consider using Hibiclens from time to time to kill all the bacteria on your skin where you are susceptible to infections.

## 2013-09-06 NOTE — Progress Notes (Signed)
David [name] is a 58 y.o. male who presents to [facility] today for abscess f/u  Abscess drained in ED on 09/04/13. Still taking ABX. Pain resolved. Minimal drainage out of axillary site (mildly bloody, non-purulent). Face site w/o drainage. Denies fevers, n/v/d, HA, rash. Tolerating PO. Placing mupriocin on wounds.   The following portions of the patient's history were reviewed and updated as appropriate: allergies, current medications, past medical history, family and social history, and problem list.    Past Medical History  Diagnosis Date  . Arthritis     ROS as above otherwise neg.    Medications reviewed. Current Outpatient Prescriptions  Medication Sig Dispense Refill  . Aspirin (ADULT ASPIRIN LOW STRENGTH) 81 MG EC tablet Take 1 tablet (81 mg total) by mouth daily.  90 tablet  3  . chlorhexidine (HIBICLENS) 4 % external liquid Apply topically daily as needed.  120 mL  0  . diclofenac (VOLTAREN) 75 MG EC tablet Take 75 mg by mouth 2 (two) times daily.      . mupirocin nasal ointment (BACTROBAN) 2 % Apply in each nostril daily  10 g  0  . sulfamethoxazole-trimethoprim (BACTRIM DS,SEPTRA DS) 800-160 MG per tablet Take 1 tablet by mouth 2 (two) times daily.  14 tablet  0  . Tamsulosin HCl (FLOMAX) 0.4 MG CAPS Take 1 capsule (0.4 mg total) by mouth daily.  30 capsule  6   No current facility-administered medications for this visit.    Exam:  BP 129/82  Pulse 82  Temp(Src) 97.4 F (36.3 C) (Oral)  Wt 189 lb (85.73 kg) Gen: Well NAD HEENT: EOMI,  MMM Lungs: CTABL Nl WOB Heart: RRR no MRG Abd: NABS, NT, ND Exts: Non edematous BL  LE, warm and well perfused.  Skuin: R cheek abscess w/ 1cm of surounding induration, no drainage, 1cm incision well healing, non-painful R axilla sore still w/ clear drainage and 1.5cm area of induration. nonttp  No results found for this or any previous visit (from the past 72 hour(s)).  A/P (as seen in Problem list)  Abscess of multiple  sites Resolving  Cont bactrim Continue mupirocin under axilla until closes hibiclens prn and education on hygiene and future abscess treatment due to h/o recurrent abscesses.

## 2013-09-11 ENCOUNTER — Ambulatory Visit: Payer: Medicare Other | Admitting: Physical Therapy

## 2013-09-13 ENCOUNTER — Ambulatory Visit: Payer: Medicare Other

## 2013-09-13 ENCOUNTER — Encounter: Payer: Self-pay | Admitting: Sports Medicine

## 2013-09-13 ENCOUNTER — Ambulatory Visit (INDEPENDENT_AMBULATORY_CARE_PROVIDER_SITE_OTHER): Payer: Medicare Other | Admitting: Sports Medicine

## 2013-09-13 VITALS — BP 120/82 | HR 80 | Ht 70.0 in | Wt 180.0 lb

## 2013-09-13 DIAGNOSIS — M25519 Pain in unspecified shoulder: Secondary | ICD-10-CM

## 2013-09-13 NOTE — Progress Notes (Signed)
Patient ID: David [name], male   DOB: 1956-01-19, 58 y.o.   MRN: [id number]  Patient comes in today for followup on left shoulder pain. Still struggling with pain. He has had 4 total physical therapy visits and does not feel like he is making improvement. He has an MRI diagnosed labral tear in the shoulder. In regards to his right knee, x-rays show no significant degenerative changes. He does have some calcification of the menisci. Currently, his knee is not bothering him. Patient is definitely interested in surgical intervention for his labral tear. His right shoulder was operated on by an orthopedist at [facility] 3 years ago but he cannot recall the name of the orthopedist. We will see if we can refer him back to that same physician. Discontinue physical therapy for the time being. Follow up with me when necessary.

## 2013-09-14 ENCOUNTER — Telehealth: Payer: Self-pay | Admitting: *Deleted

## 2013-09-14 DIAGNOSIS — M25519 Pain in unspecified shoulder: Secondary | ICD-10-CM

## 2013-09-14 NOTE — Telephone Encounter (Signed)
Scheduled pt with Dr. Eden Lathe (who did his rt rotator cuff surgery in 2012) at [facility] in [place]  09/28/13 at 3:15 pm  Pt notified of appt info.   Advised pt to bring copy of disc with imaging and current insurance card.

## 2013-09-21 ENCOUNTER — Other Ambulatory Visit: Payer: Self-pay | Admitting: Family Medicine

## 2013-10-19 ENCOUNTER — Other Ambulatory Visit: Payer: Self-pay | Admitting: *Deleted

## 2013-10-19 MED ORDER — TAMSULOSIN HCL 0.4 MG PO CAPS: 0.4000 mg | ORAL_CAPSULE | Freq: Every day | ORAL | Status: DC

## 2013-10-19 NOTE — Telephone Encounter (Signed)
Request is to dispense 90 day supply. Derl Barrow, RN

## 2013-10-23 ENCOUNTER — Ambulatory Visit (INDEPENDENT_AMBULATORY_CARE_PROVIDER_SITE_OTHER): Payer: Medicare Other | Admitting: Family Medicine

## 2013-10-23 ENCOUNTER — Encounter: Payer: Self-pay | Admitting: Family Medicine

## 2013-10-23 VITALS — BP 126/66 | HR 82 | Temp 98.1°F | Ht 70.0 in | Wt 188.0 lb

## 2013-10-23 DIAGNOSIS — R0989 Other specified symptoms and signs involving the circulatory and respiratory systems: Secondary | ICD-10-CM

## 2013-10-23 DIAGNOSIS — R0609 Other forms of dyspnea: Secondary | ICD-10-CM

## 2013-10-23 DIAGNOSIS — R0683 Snoring: Secondary | ICD-10-CM | POA: Insufficient documentation

## 2013-10-23 NOTE — Progress Notes (Signed)
   Subjective:    Patient ID: David [name], male    DOB: April 01, 1956, 58 y.o.   MRN: [id number]  HPI 13 -year-old male presents for same-day visit discuss the following:  #1 question sleep apnea: Patient reports he had left shoulder surgery on 10/13/2013 and was noted to have postop apnea. His mother who also reports that he intermittently stops breathing while he is asleep, his of his mother for the past 4 months. He is also snore. He missed intermittent alcohol abuse. He is currently taking Percocet postop. He denies illicit drug use. He denies daytime somnolence.  Social history: Nonsmoker. Review of Systems As per history of present illness Denies fever, chills, weight loss, sore throat,     Objective:   Physical Exam BP 126/66  Pulse 82  Temp(Src) 98.1 F (36.7 C) (Oral)  Ht 5\' 10"  (1.778 m)  Wt 188 lb (85.276 kg)  BMI 26.98 kg/m2 General appearance: alert, cooperative and no distress, thin male  Throat: lips, mucosa, and tongue normal; teeth and gums normal Neck: no carotid bruit, no JVD, supple, symmetrical, trachea midline, thyroid not enlarged, symmetric, no tenderness/mass/nodules and one R anterior LN palpated  Lungs - Normal respiratory effort, chest expands symmetrically. Lungs are clear to auscultation, no crackles or wheezes. Heart: regular rate and rhythm, S1, S2 normal, no murmur, click, rub or gallop    Assessment & Plan:

## 2013-10-23 NOTE — Assessment & Plan Note (Addendum)
A: snoring history concern for sleep apnea. P: referral for sleep study Avoid ETOH and limit percocet use to prn only advised.  PCP to f/u.

## 2013-10-23 NOTE — Patient Instructions (Signed)
[name],  Thank you for coming in today. I have ordered a sleep study. The center will call you to schedule it.  Dr. Gwendlyn Deutscher will follow up on it. In the meantime, avoid alcohol and take percocet only as needed as both are sedatives that could worsen sleep apnea if present.   Dr. Adrian Blackwater

## 2013-10-24 ENCOUNTER — Other Ambulatory Visit: Payer: Self-pay | Admitting: *Deleted

## 2013-10-24 DIAGNOSIS — M25512 Pain in left shoulder: Secondary | ICD-10-CM

## 2013-11-01 ENCOUNTER — Ambulatory Visit: Payer: Medicare HMO | Attending: Sports Medicine

## 2013-11-01 DIAGNOSIS — IMO0001 Reserved for inherently not codable concepts without codable children: Secondary | ICD-10-CM | POA: Insufficient documentation

## 2013-11-01 DIAGNOSIS — R293 Abnormal posture: Secondary | ICD-10-CM | POA: Insufficient documentation

## 2013-11-01 DIAGNOSIS — M25519 Pain in unspecified shoulder: Secondary | ICD-10-CM | POA: Insufficient documentation

## 2013-11-01 DIAGNOSIS — M25619 Stiffness of unspecified shoulder, not elsewhere classified: Secondary | ICD-10-CM | POA: Insufficient documentation

## 2013-11-13 ENCOUNTER — Encounter: Payer: Medicare Other | Admitting: Physical Therapy

## 2013-11-13 ENCOUNTER — Ambulatory Visit: Payer: Medicare HMO | Admitting: Physical Therapy

## 2013-11-14 ENCOUNTER — Ambulatory Visit: Payer: Medicare HMO | Admitting: Physical Therapy

## 2013-11-20 ENCOUNTER — Ambulatory Visit: Payer: Medicare HMO | Admitting: Physical Therapy

## 2013-11-21 ENCOUNTER — Ambulatory Visit (INDEPENDENT_AMBULATORY_CARE_PROVIDER_SITE_OTHER): Payer: Medicare HMO | Admitting: Family Medicine

## 2013-11-21 ENCOUNTER — Encounter: Payer: Self-pay | Admitting: Family Medicine

## 2013-11-21 VITALS — Temp 98.0°F | Ht 70.0 in | Wt 183.0 lb

## 2013-11-21 DIAGNOSIS — H8110 Benign paroxysmal vertigo, unspecified ear: Secondary | ICD-10-CM

## 2013-11-21 MED ORDER — MECLIZINE HCL 12.5 MG PO TABS: 12.5000 mg | ORAL_TABLET | Freq: Three times a day (TID) | ORAL | Status: DC | PRN

## 2013-11-21 NOTE — Patient Instructions (Signed)

## 2013-11-21 NOTE — Progress Notes (Signed)
Subjective:     Patient ID: JAMARIOUS SHON, male   DOB: 10/09/1955, 58 y.o.   MRN: [id number]  HPI 58 y.o. M here with complaints of dizziness over last few weeks. Pt has had dizziness august to November of last year. Pt diagnosesd with vertigo at that time. Room spinning, Slight spinning. Worse when coming from laying down, Rapid head movement can make it worse. Recently started percocet infrequently 1-2/week  And doesn't seem to be related. Recent congestion for last couple of days.   No ringing in ears.   Review of Systems No f/c, no headaches, no hearing loss, occassional hot flash started over last week or two. No unitentional weight loss. No signicant cough.   Tested for TB in august - neg skin test    Objective:   Physical Exam Filed Vitals:   11/21/13 0921  Temp: 98 F (36.7 C)  Laying 134/88, sitting 143/95  Standing 135/89 + [employer] No sinus tenderness no LAD, no skin findings No c/c/e  RRR no mgt      Assessment:     58 y.o. M with hx of vertigo and +dix hallpike - meclizine PRN - epply maneuver PRN - restart allergy med to decrease inner ear congestion - f/u 2 weeks for reeval and to discuss ED.  Fredrik Rigger, MD OB Fellow

## 2013-11-22 ENCOUNTER — Ambulatory Visit: Payer: Medicare HMO | Admitting: Physical Therapy

## 2013-11-22 ENCOUNTER — Encounter: Payer: Medicare Other | Admitting: Physical Therapy

## 2013-11-23 ENCOUNTER — Other Ambulatory Visit: Payer: Self-pay | Admitting: Family Medicine

## 2013-11-26 ENCOUNTER — Ambulatory Visit (HOSPITAL_BASED_OUTPATIENT_CLINIC_OR_DEPARTMENT_OTHER): Payer: Medicare HMO | Attending: Family Medicine

## 2013-11-26 VITALS — Ht 70.0 in | Wt 185.0 lb

## 2013-11-26 DIAGNOSIS — R0683 Snoring: Secondary | ICD-10-CM

## 2013-11-26 DIAGNOSIS — R0609 Other forms of dyspnea: Secondary | ICD-10-CM | POA: Insufficient documentation

## 2013-11-26 DIAGNOSIS — G4733 Obstructive sleep apnea (adult) (pediatric): Secondary | ICD-10-CM

## 2013-11-26 DIAGNOSIS — R0989 Other specified symptoms and signs involving the circulatory and respiratory systems: Secondary | ICD-10-CM | POA: Insufficient documentation

## 2013-11-28 ENCOUNTER — Ambulatory Visit: Payer: Medicare HMO | Admitting: Physical Therapy

## 2013-11-30 ENCOUNTER — Ambulatory Visit: Payer: Medicare HMO | Attending: Sports Medicine | Admitting: Physical Therapy

## 2013-11-30 DIAGNOSIS — M25519 Pain in unspecified shoulder: Secondary | ICD-10-CM | POA: Insufficient documentation

## 2013-11-30 DIAGNOSIS — M25619 Stiffness of unspecified shoulder, not elsewhere classified: Secondary | ICD-10-CM | POA: Insufficient documentation

## 2013-11-30 DIAGNOSIS — IMO0001 Reserved for inherently not codable concepts without codable children: Secondary | ICD-10-CM | POA: Insufficient documentation

## 2013-11-30 DIAGNOSIS — R293 Abnormal posture: Secondary | ICD-10-CM | POA: Insufficient documentation

## 2013-12-04 ENCOUNTER — Ambulatory Visit: Payer: Medicare HMO | Admitting: Physical Therapy

## 2013-12-05 ENCOUNTER — Encounter: Payer: Self-pay | Admitting: Family Medicine

## 2013-12-05 ENCOUNTER — Ambulatory Visit (INDEPENDENT_AMBULATORY_CARE_PROVIDER_SITE_OTHER): Payer: Medicare HMO | Admitting: Family Medicine

## 2013-12-05 VITALS — BP 138/79 | HR 75 | Temp 97.8°F | Ht 70.0 in | Wt 184.0 lb

## 2013-12-05 DIAGNOSIS — R7301 Impaired fasting glucose: Secondary | ICD-10-CM

## 2013-12-05 DIAGNOSIS — M7502 Adhesive capsulitis of left shoulder: Secondary | ICD-10-CM

## 2013-12-05 DIAGNOSIS — R748 Abnormal levels of other serum enzymes: Secondary | ICD-10-CM

## 2013-12-05 DIAGNOSIS — M7500 Adhesive capsulitis of unspecified shoulder: Secondary | ICD-10-CM

## 2013-12-05 DIAGNOSIS — Z131 Encounter for screening for diabetes mellitus: Secondary | ICD-10-CM

## 2013-12-05 DIAGNOSIS — R945 Abnormal results of liver function studies: Secondary | ICD-10-CM

## 2013-12-05 DIAGNOSIS — E785 Hyperlipidemia, unspecified: Secondary | ICD-10-CM

## 2013-12-05 DIAGNOSIS — R7989 Other specified abnormal findings of blood chemistry: Secondary | ICD-10-CM

## 2013-12-05 LAB — LIPID PANEL
Cholesterol: 230 mg/dL — ABNORMAL HIGH (ref 0–200)
HDL: 34 mg/dL — AB (ref >39)
LDL Cholesterol: 150 mg/dL — ABNORMAL HIGH (ref 0–99)
Total CHOL/HDL Ratio: 6.8 Ratio
Triglycerides: 228 mg/dL — ABNORMAL HIGH (ref <150)
VLDL: 46 mg/dL — ABNORMAL HIGH (ref 0–40)

## 2013-12-05 LAB — COMPLETE METABOLIC PANEL WITH GFR
ALK PHOS: 134 U/L — AB (ref 39–117)
ALT: 30 U/L (ref 0–53)
AST: 31 U/L (ref 0–37)
Albumin: 4 g/dL (ref 3.5–5.2)
BUN: 20 mg/dL (ref 6–23)
CO2: 26 mEq/L (ref 19–32)
CREATININE: 1.13 mg/dL (ref 0.50–1.35)
Calcium: 9.2 mg/dL (ref 8.4–10.5)
Chloride: 104 mEq/L (ref 96–112)
GFR, EST NON AFRICAN AMERICAN: 71 mL/min
GFR, Est African American: 82 mL/min
Glucose, Bld: 116 mg/dL — ABNORMAL HIGH (ref 70–99)
POTASSIUM: 3.9 meq/L (ref 3.5–5.3)
Sodium: 138 mEq/L (ref 135–145)
Total Bilirubin: 0.3 mg/dL (ref 0.2–1.2)
Total Protein: 7.8 g/dL (ref 6.0–8.3)

## 2013-12-05 LAB — POCT GLYCOSYLATED HEMOGLOBIN (HGB A1C): Hemoglobin A1C: 6.2

## 2013-12-05 NOTE — Assessment & Plan Note (Signed)
DM screening done today with A1C check and serum glucose check. I will call him with report.

## 2013-12-05 NOTE — Assessment & Plan Note (Signed)
S/P Arthroscopic surgery 1 month ago at [facility]. Patient is doing better. Continue pain medicine and follow up with orthopedic as instructed.

## 2013-12-05 NOTE — Progress Notes (Signed)
Subjective:     Patient ID: David [name], male   DOB: 1956-07-17, 58 y.o.   MRN: [id number]  HPI HLD;Patient has hx of elevated lipid panel,but not on medication,he has been doing well on his diet, he is here for follow up. Elevated Liver enz:He has hx of abnormal liver enzymes, he stated when he was incarcerated few months ago he was diagnosed with Hep C. He denies hx of blood transfusion in the past. No change in his skin color. Shoulder pain:Patient stated he has hx of roator cuff tear for which he had surgery, his most recent surgery was last month with repair of his left shoulder by Dr Eden Lathe at [place], he feels better with his shoulder pain and gets percocet prn prescribed by Dr Al Corpus. Elevated fasting sugar: Hx of elevated fasting sugar in the past, here for follow up.  Current Outpatient Prescriptions on File Prior to Visit  Medication Sig Dispense Refill  . diclofenac (VOLTAREN) 75 MG EC tablet TAKE 1 TABLET BY MOUTH TWICE A DAY  60 tablet  4  . tamsulosin (FLOMAX) 0.4 MG CAPS capsule Take 1 capsule (0.4 mg total) by mouth daily.  90 capsule  1  . Aspirin (ADULT ASPIRIN LOW STRENGTH) 81 MG EC tablet Take 1 tablet (81 mg total) by mouth daily.  90 tablet  3  . meclizine (ANTIVERT) 12.5 MG tablet Take 1 tablet (12.5 mg total) by mouth 3 (three) times daily as needed for dizziness.  30 tablet  0  . oxyCODONE-acetaminophen (PERCOCET/ROXICET) 5-325 MG per tablet Take by mouth every 4 (four) hours as needed for severe pain. Per ortho       No current facility-administered medications on file prior to visit.   Past Medical History  Diagnosis Date  . Arthritis       Review of Systems  Respiratory: Negative.   Cardiovascular: Negative.   Gastrointestinal: Negative.   Genitourinary: Negative.   All other systems reviewed and are negative.   Filed Vitals:   12/05/13 1003  BP: 138/79  Pulse: 75  Temp: 97.8 F (36.6 C)  TempSrc: Oral  Height: 5\' 10"  (1.778  m)  Weight: 184 lb (83.462 kg)       Objective:   Physical Exam  Nursing note and vitals reviewed. Constitutional: No distress.  Cardiovascular: Normal rate, regular rhythm and normal heart sounds.   No murmur heard. Pulmonary/Chest: Effort normal and breath sounds normal. No respiratory distress. He has no wheezes.  Abdominal: Soft. Bowel sounds are normal. He exhibits no distension and no mass. There is no tenderness.  Musculoskeletal: Normal range of motion. He exhibits no edema.       Assessment:     HLD; Elevated Liver enz: Shoulder pain: Left shoulder adhesive capsulitis Elevated fasting sugar:     Plan:     Check problem list.

## 2013-12-05 NOTE — Patient Instructions (Signed)
Hypertriglyceridemia  Diet for High blood levels of Triglycerides Most fats in food are triglycerides. Triglycerides in your blood are stored as fat in your body. High levels of triglycerides in your blood may put you at a greater risk for heart disease and stroke.  Normal triglyceride levels are less than 150 mg/dL. Borderline high levels are 150-199 mg/dl. High levels are 200 - 499 mg/dL, and very high triglyceride levels are greater than 500 mg/dL. The decision to treat high triglycerides is generally based on the level. For people with borderline or high triglyceride levels, treatment includes weight loss and exercise. Drugs are recommended for people with very high triglyceride levels. Many people who need treatment for high triglyceride levels have metabolic syndrome. This syndrome is a collection of disorders that often include: insulin resistance, high blood pressure, blood clotting problems, high cholesterol and triglycerides. TESTING PROCEDURE FOR TRIGLYCERIDES  You should not eat 4 hours before getting your triglycerides measured. The normal range of triglycerides is between 10 and 250 milligrams per deciliter (mg/dl). Some people may have extreme levels (1000 or above), but your triglyceride level may be too high if it is above 150 mg/dl, depending on what other risk factors you have for heart disease.  People with high blood triglycerides may also have high blood cholesterol levels. If you have high blood cholesterol as well as high blood triglycerides, your risk for heart disease is probably greater than if you only had high triglycerides. High blood cholesterol is one of the main risk factors for heart disease. CHANGING YOUR DIET  Your weight can affect your blood triglyceride level. If you are more than 20% above your ideal body weight, you may be able to lower your blood triglycerides by losing weight. Eating less and exercising regularly is the best way to combat this. Fat provides more  calories than any other food. The best way to lose weight is to eat less fat. Only 30% of your total calories should come from fat. Less than 7% of your diet should come from saturated fat. A diet low in fat and saturated fat is the same as a diet to decrease blood cholesterol. By eating a diet lower in fat, you may lose weight, lower your blood cholesterol, and lower your blood triglyceride level.  Eating a diet low in fat, especially saturated fat, may also help you lower your blood triglyceride level. Ask your dietitian to help you figure how much fat you can eat based on the number of calories your caregiver has prescribed for you.  Exercise, in addition to helping with weight loss may also help lower triglyceride levels.   Alcohol can increase blood triglycerides. You may need to stop drinking alcoholic beverages.  Too much carbohydrate in your diet may also increase your blood triglycerides. Some complex carbohydrates are necessary in your diet. These may include bread, rice, potatoes, other starchy vegetables and cereals.  Reduce "simple" carbohydrates. These may include pure sugars, candy, honey, and jelly without losing other nutrients. If you have the kind of high blood triglycerides that is affected by the amount of carbohydrates in your diet, you will need to eat less sugar and less high-sugar foods. Your caregiver can help you with this.  Adding 2-4 grams of fish oil (EPA+ DHA) may also help lower triglycerides. Speak with your caregiver before adding any supplements to your regimen. Following the Diet  Maintain your ideal weight. Your caregivers can help you with a diet. Generally, eating less food and getting more   exercise will help you lose weight. Joining a weight control group may also help. Ask your caregivers for a good weight control group in your area.  Eat low-fat foods instead of high-fat foods. This can help you lose weight too.  These foods are lower in fat. Eat MORE of these:    Dried beans, peas, and lentils.  Egg whites.  Low-fat cottage cheese.  Fish.  Lean cuts of meat, such as round, sirloin, rump, and flank (cut extra fat off meat you fix).  Whole grain breads, cereals and pasta.  Skim and nonfat dry milk.  Low-fat yogurt.  Poultry without the skin.  Cheese made with skim or part-skim milk, such as mozzarella, parmesan, farmers', ricotta, or pot cheese. These are higher fat foods. Eat LESS of these:   Whole milk and foods made from whole milk, such as American, blue, cheddar, monterey jack, and swiss cheese  High-fat meats, such as luncheon meats, sausages, knockwurst, bratwurst, hot dogs, ribs, corned beef, ground pork, and regular ground beef.  Fried foods. Limit saturated fats in your diet. Substituting unsaturated fat for saturated fat may decrease your blood triglyceride level. You will need to read package labels to know which products contain saturated fats.  These foods are high in saturated fat. Eat LESS of these:   Fried pork skins.  Whole milk.  Skin and fat from poultry.  Palm oil.  Butter.  Shortening.  Cream cheese.  Bacon.  Margarines and baked goods made from listed oils.  Vegetable shortenings.  Chitterlings.  Fat from meats.  Coconut oil.  Palm kernel oil.  Lard.  Cream.  Sour cream.  Fatback.  Coffee whiteners and non-dairy creamers made with these oils.  Cheese made from whole milk. Use unsaturated fats (both polyunsaturated and monounsaturated) moderately. Remember, even though unsaturated fats are better than saturated fats; you still want a diet low in total fat.  These foods are high in unsaturated fat:   Canola oil.  Sunflower oil.  Mayonnaise.  Almonds.  Peanuts.  Pine nuts.  Margarines made with these oils.  Safflower oil.  Olive oil.  Avocados.  Cashews.  Peanut butter.  Sunflower seeds.  Soybean oil.  Peanut  oil.  Olives.  Pecans.  Walnuts.  Pumpkin seeds. Avoid sugar and other high-sugar foods. This will decrease carbohydrates without decreasing other nutrients. Sugar in your food goes rapidly to your blood. When there is excess sugar in your blood, your liver may use it to make more triglycerides. Sugar also contains calories without other important nutrients.  Eat LESS of these:   Sugar, brown sugar, powdered sugar, jam, jelly, preserves, honey, syrup, molasses, pies, candy, cakes, cookies, frosting, pastries, colas, soft drinks, punches, fruit drinks, and regular gelatin.  Avoid alcohol. Alcohol, even more than sugar, may increase blood triglycerides. In addition, alcohol is high in calories and low in nutrients. Ask for sparkling water, or a diet soft drink instead of an alcoholic beverage. Suggestions for planning and preparing meals   Bake, broil, grill or roast meats instead of frying.  Remove fat from meats and skin from poultry before cooking.  Add spices, herbs, lemon juice or vinegar to vegetables instead of salt, rich sauces or gravies.  Use a non-stick skillet without fat or use no-stick sprays.  Cool and refrigerate stews and broth. Then remove the hardened fat floating on the surface before serving.  Refrigerate meat drippings and skim off fat to make low-fat gravies.  Serve more fish.  Use less butter,   margarine and other high-fat spreads on bread or vegetables.  Use skim or reconstituted non-fat dry milk for cooking.  Cook with low-fat cheeses.  Substitute low-fat yogurt or cottage cheese for all or part of the sour cream in recipes for sauces, dips or congealed salads.  Use half yogurt/half mayonnaise in salad recipes.  Substitute evaporated skim milk for cream. Evaporated skim milk or reconstituted non-fat dry milk can be whipped and substituted for whipped cream in certain recipes.  Choose fresh fruits for dessert instead of high-fat foods such as pies or  cakes. Fruits are naturally low in fat. When Dining Out   Order low-fat appetizers such as fruit or vegetable juice, pasta with vegetables or tomato sauce.  Select clear, rather than cream soups.  Ask that dressings and gravies be served on the side. Then use less of them.  Order foods that are baked, broiled, poached, steamed, stir-fried, or roasted.  Ask for margarine instead of butter, and use only a small amount.  Drink sparkling water, unsweetened tea or coffee, or diet soft drinks instead of alcohol or other sweet beverages. QUESTIONS AND ANSWERS ABOUT OTHER FATS IN THE BLOOD: SATURATED FAT, TRANS FAT, AND CHOLESTEROL What is trans fat? Trans fat is a type of fat that is formed when vegetable oil is hardened through a process called hydrogenation. This process helps makes foods more solid, gives them shape, and prolongs their shelf life. Trans fats are also called hydrogenated or partially hydrogenated oils.  What do saturated fat, trans fat, and cholesterol in foods have to do with heart disease? Saturated fat, trans fat, and cholesterol in the diet all raise the level of LDL "bad" cholesterol in the blood. The higher the LDL cholesterol, the greater the risk for coronary heart disease (CHD). Saturated fat and trans fat raise LDL similarly.  What foods contain saturated fat, trans fat, and cholesterol? High amounts of saturated fat are found in animal products, such as fatty cuts of meat, chicken skin, and full-fat dairy products like butter, whole milk, cream, and cheese, and in tropical vegetable oils such as palm, palm kernel, and coconut oil. Trans fat is found in some of the same foods as saturated fat, such as vegetable shortening, some margarines (especially hard or stick margarine), crackers, cookies, baked goods, fried foods, salad dressings, and other processed foods made with partially hydrogenated vegetable oils. Small amounts of trans fat also occur naturally in some animal  products, such as milk products, beef, and lamb. Foods high in cholesterol include liver, other organ meats, egg yolks, shrimp, and full-fat dairy products. How can I use the new food label to make heart-healthy food choices? Check the Nutrition Facts panel of the food label. Choose foods lower in saturated fat, trans fat, and cholesterol. For saturated fat and cholesterol, you can also use the Percent Daily Value (%DV): 5% DV or less is low, and 20% DV or more is high. (There is no %DV for trans fat.) Use the Nutrition Facts panel to choose foods low in saturated fat and cholesterol, and if the trans fat is not listed, read the ingredients and limit products that list shortening or hydrogenated or partially hydrogenated vegetable oil, which tend to be high in trans fat. POINTS TO REMEMBER:   Discuss your risk for heart disease with your caregivers, and take steps to reduce risk factors.  Change your diet. Choose foods that are low in saturated fat, trans fat, and cholesterol.  Add exercise to your daily routine if   it is not already being done. Participate in physical activity of moderate intensity, like brisk walking, for at least 30 minutes on most, and preferably all days of the week. No time? Break the 30 minutes into three, 10-minute segments during the day.  Stop smoking. If you do smoke, contact your caregiver to discuss ways in which they can help you quit.  Do not use street drugs.  Maintain a normal weight.  Maintain a healthy blood pressure.  Keep up with your blood work for checking the fats in your blood as directed by your caregiver. Document Released: 06/04/2004 Document Revised: 02/16/2012 Document Reviewed: 12/31/2008 [employer] Patient Information 2014 [employer].  

## 2013-12-05 NOTE — Assessment & Plan Note (Signed)
FLP checked today.

## 2013-12-05 NOTE — Assessment & Plan Note (Signed)
CMP checked today. Also Hepatitis panel due to hx of hep C given. Avoid NSAID. I will call him with result.

## 2013-12-06 ENCOUNTER — Ambulatory Visit: Payer: Medicare HMO | Admitting: Physical Therapy

## 2013-12-06 ENCOUNTER — Telehealth: Payer: Self-pay | Admitting: Family Medicine

## 2013-12-06 LAB — ACUTE HEP PANEL AND HEP B SURFACE AB
HCV Ab: NEGATIVE
HEP B S AB: NEGATIVE
Hep A IgM: NONREACTIVE
Hep B C IgM: NONREACTIVE
Hepatitis B Surface Ag: NEGATIVE

## 2013-12-06 LAB — HEPATITIS C ANTIBODY, REFLEX: HCV Ab: NEGATIVE

## 2013-12-06 NOTE — Telephone Encounter (Signed)
Patient called and test result was discussed with his, lipid profile is abnormal with his 25yr CVD risk of 9.5% I recommended statin therapy and Fish oil as well as diet and exercise modification. He prefers diet and exercise modification in addition to fish oil instead of statin for now. He is informed he can get fish oil OTC I will recheck his lipid profile in 1 yr.

## 2013-12-09 DIAGNOSIS — G4710 Hypersomnia, unspecified: Secondary | ICD-10-CM

## 2013-12-09 DIAGNOSIS — G4733 Obstructive sleep apnea (adult) (pediatric): Secondary | ICD-10-CM

## 2013-12-09 DIAGNOSIS — G4730 Sleep apnea, unspecified: Secondary | ICD-10-CM

## 2013-12-09 DIAGNOSIS — R0609 Other forms of dyspnea: Secondary | ICD-10-CM

## 2013-12-09 DIAGNOSIS — R0989 Other specified symptoms and signs involving the circulatory and respiratory systems: Secondary | ICD-10-CM

## 2013-12-09 NOTE — Sleep Study (Signed)
   NAME: David [name] DATE OF BIRTH:  11-Jun-1956 MEDICAL RECORD NUMBER [id number]  LOCATION: [facility]  PHYSICIAN: Anshul Meddings D Asiah Befort  DATE OF STUDY: 11/26/2013  SLEEP STUDY TYPE: Nocturnal Polysomnogram               REFERRING PHYSICIAN: Funches, Josalyn C, MD  INDICATION FOR STUDY: Hypersomnia with sleep apnea  EPWORTH SLEEPINESS SCORE:   9/24 HEIGHT: 5\' 10"  (177.8 cm)  WEIGHT: 185 lb (83.915 kg)    Body mass index is 26.54 kg/(m^2).  NECK SIZE: 16 in.  MEDICATIONS: Charted for review  SLEEP ARCHITECTURE: Total sleep time 293 minutes with sleep efficiency 76.8%. Stage I was 3.1%, stage II 69.5%, stage III absent, REM 27.5% of total sleep time. Sleep latency 32.5 minutes, REM latency 53.5 minutes, awake after sleep onset 56 minutes, arousal index 0.6. Bedtime medication: None  RESPIRATORY DATA: Apnea hypopnea index (AHI) 0.4 per hour. 2 total events scored, both as hypopneas. REM AHI 0.7 per hour. He did not qualify for CPAP titration.  OXYGEN DATA: Very loud snoring with oxygen desaturation to a nadir of 89% and mean oxygen saturation through the study of 94% on room air  CARDIAC DATA: Sinus rhythm with occasional PVC  MOVEMENT/PARASOMNIA: 88 limb jerks were counted but none caused apparent sleep disturbance. Bathroom x1  IMPRESSION/ RECOMMENDATION:   1) Occasional respiratory event with sleep disturbance, within normal limits. AHI 0.4 per hour (the normal range for adults is an AHI from 0-5 events per hour). Very loud snoring with oxygen desaturation to a nadir of 89% and mean oxygen saturation through the study of 94% on room air.   Signed [name] M.D. [place], [employer] adviser of Sleep Medicine  ELECTRONICALLY SIGNED ON:  12/09/2013, 9:09 AM [place] PH: [phone]   FX: [phone] [place]

## 2013-12-11 ENCOUNTER — Ambulatory Visit: Payer: Medicare HMO | Admitting: Physical Therapy

## 2013-12-12 ENCOUNTER — Ambulatory Visit: Payer: Medicare HMO | Admitting: Physical Therapy

## 2013-12-18 ENCOUNTER — Telehealth: Payer: Self-pay | Admitting: Family Medicine

## 2013-12-18 NOTE — Telephone Encounter (Signed)
I called and reviewed sleep study with Dr Annamaria Boots, it was essentially normal with no concern for sleep apnea.

## 2013-12-19 ENCOUNTER — Telehealth: Payer: Self-pay | Admitting: *Deleted

## 2013-12-19 ENCOUNTER — Ambulatory Visit: Payer: Medicare HMO | Admitting: Physical Therapy

## 2013-12-19 NOTE — Telephone Encounter (Signed)
LM message for patient to call back if he is still having symptoms or has any questions. LM that  "sleep study test was normal".  Jazmin Hartsell,CMA

## 2013-12-19 NOTE — Telephone Encounter (Signed)
Message copied by Valerie Roys on Tue Dec 19, 2013  9:25 AM ------      Message from: Andrena Mews T      Created: Mon Dec 18, 2013  9:11 PM       Hello blue team,             Please call to inform patient, sleep study test result is normal. Have him schedule follow up if still having symptoms or having any question. Thanks.             Dr Gwendlyn Deutscher  ------

## 2013-12-21 ENCOUNTER — Encounter: Payer: Medicare HMO | Admitting: Physical Therapy

## 2013-12-25 ENCOUNTER — Ambulatory Visit: Payer: Medicare HMO | Admitting: Physical Therapy

## 2013-12-27 ENCOUNTER — Ambulatory Visit: Payer: Medicare HMO | Admitting: Physical Therapy

## 2014-01-01 ENCOUNTER — Encounter: Payer: Medicare HMO | Admitting: Physical Therapy

## 2014-01-03 ENCOUNTER — Ambulatory Visit: Payer: Medicare HMO | Attending: Sports Medicine | Admitting: Physical Therapy

## 2014-01-03 DIAGNOSIS — R293 Abnormal posture: Secondary | ICD-10-CM | POA: Insufficient documentation

## 2014-01-03 DIAGNOSIS — M25519 Pain in unspecified shoulder: Secondary | ICD-10-CM | POA: Insufficient documentation

## 2014-01-03 DIAGNOSIS — IMO0001 Reserved for inherently not codable concepts without codable children: Secondary | ICD-10-CM | POA: Insufficient documentation

## 2014-01-03 DIAGNOSIS — M25619 Stiffness of unspecified shoulder, not elsewhere classified: Secondary | ICD-10-CM | POA: Insufficient documentation

## 2014-01-08 ENCOUNTER — Ambulatory Visit: Payer: Medicare HMO | Admitting: Physical Therapy

## 2014-01-11 ENCOUNTER — Ambulatory Visit: Payer: Medicare HMO | Admitting: Physical Therapy

## 2014-01-16 ENCOUNTER — Encounter: Payer: Medicare HMO | Admitting: Physical Therapy

## 2014-01-17 ENCOUNTER — Ambulatory Visit: Payer: Medicare HMO | Admitting: Physical Therapy

## 2014-01-24 ENCOUNTER — Ambulatory Visit: Payer: Medicare HMO

## 2014-01-25 ENCOUNTER — Encounter: Payer: Self-pay | Admitting: Sports Medicine

## 2014-01-25 ENCOUNTER — Ambulatory Visit (INDEPENDENT_AMBULATORY_CARE_PROVIDER_SITE_OTHER): Payer: Medicare HMO | Admitting: Sports Medicine

## 2014-01-25 VITALS — BP 118/80 | Ht 70.0 in | Wt 188.0 lb

## 2014-01-25 DIAGNOSIS — S43499A Other sprain of unspecified shoulder joint, initial encounter: Secondary | ICD-10-CM

## 2014-01-25 DIAGNOSIS — M25561 Pain in right knee: Secondary | ICD-10-CM

## 2014-01-25 DIAGNOSIS — S43439A Superior glenoid labrum lesion of unspecified shoulder, initial encounter: Secondary | ICD-10-CM | POA: Insufficient documentation

## 2014-01-25 DIAGNOSIS — S46819A Strain of other muscles, fascia and tendons at shoulder and upper arm level, unspecified arm, initial encounter: Secondary | ICD-10-CM

## 2014-01-25 DIAGNOSIS — M25569 Pain in unspecified knee: Secondary | ICD-10-CM

## 2014-01-25 NOTE — Progress Notes (Signed)
   Subjective:    Patient ID: David [name], male    DOB: July 26, 1956, 58 y.o.   MRN: [id number]  HPI Persistent left shoulder and right knee pain. MRI arthrogram done previously of the left shoulder showed a rather sizable labral tear along with a longitudinal tear of the subscapularis. He has been working in physical therapy and feels like it is slowly improving. He would like to continue with another 4 weeks of therapy for his shoulder. Pain is present with activity. Improves at rest. He's noticed limited range of motion but he states that he is able to carry out activities of daily living without too much difficulty. He is also complaining of persistent right knee pain. Initially injured this knee while playing basketball. I initially evaluated him in December with an x-ray of his knee. That x-ray showed some calcification of the meniscus but no significant joint space narrowing. Cortisone injection was administered which did not improve his symptoms. He is complaining of intermittent sharp medial knee pain with activity particularly with running and with coming down stairs. He is also getting some episodes of instability in this knee. No significant swelling. No numbness or tingling. No pain more proximally in his groin.  Interim medical history unchanged Current medications reviewed No known drug allergies    Review of Systems     Objective:   Physical Exam Well-developed, well-nourished. No acute distress. Awake alert and oriented x3. Vital signs reviewed.  Left shoulder: Limited active and passive range of motion in all planes. He has limited passive external rotation to only about 30. Pain and weakness with rotator cuff stressing. Positive clunk. Neurovascularly intact distally.  Right knee: Full range of motion. No effusion. There is tenderness to palpation along the medial joint line with a positive McMurray's. Positive Thessaly's. No tenderness to palpation along the lateral joint  line. Knee is stable ligamentous exam. No popliteal cyst. There is weakness and mild atrophy of the quadriceps muscle. No other atrophy seen. Otherwise, strength is 5/5 both lower extremities and reflexes are equal at the Achilles and patellar tendons. Patient is walking without significant limp.       Assessment & Plan:  1. Left shoulder pain secondary to labral tear, rotator cuff tear, and adhesive capsulitis 2. Right knee pain worrisome for medial meniscal tear  For his left shoulder I have approved four more weeks of physical therapy. However, I have explained to the patient that he will likely need surgical intervention at some point. Although his adhesive capsulitis may improve given enough time his labral tear will need to be surgically addressed at some point. For the right knee, I think we should pursue an MRI to rule out a meniscal tear. If tear is confirmed then the patient would be interested in referral to discuss arthroscopy. I will call the patient with the MRI results once available but he will followup with me in 4 weeks for recheck of his shoulder. He is also asking about the possibility of employment. I've given him a note approving him for sitting duty only.

## 2014-01-30 ENCOUNTER — Ambulatory Visit: Payer: Medicare HMO | Attending: Sports Medicine | Admitting: Rehabilitation

## 2014-01-30 DIAGNOSIS — M25619 Stiffness of unspecified shoulder, not elsewhere classified: Secondary | ICD-10-CM | POA: Insufficient documentation

## 2014-01-30 DIAGNOSIS — R293 Abnormal posture: Secondary | ICD-10-CM | POA: Insufficient documentation

## 2014-01-30 DIAGNOSIS — M25519 Pain in unspecified shoulder: Secondary | ICD-10-CM | POA: Insufficient documentation

## 2014-01-30 DIAGNOSIS — IMO0001 Reserved for inherently not codable concepts without codable children: Secondary | ICD-10-CM | POA: Insufficient documentation

## 2014-01-31 ENCOUNTER — Inpatient Hospital Stay: Admission: RE | Admit: 2014-01-31 | Payer: Medicare HMO | Source: Ambulatory Visit

## 2014-02-01 ENCOUNTER — Ambulatory Visit: Payer: Medicare HMO | Admitting: Rehabilitation

## 2014-02-06 ENCOUNTER — Encounter: Payer: Medicare HMO | Admitting: Physical Therapy

## 2014-02-07 ENCOUNTER — Ambulatory Visit
Admission: RE | Admit: 2014-02-07 | Discharge: 2014-02-07 | Disposition: A | Discharge status: Home / Self Care | Payer: Medicare HMO | Source: Ambulatory Visit | Attending: Sports Medicine | Admitting: Sports Medicine

## 2014-02-07 DIAGNOSIS — M25561 Pain in right knee: Secondary | ICD-10-CM

## 2014-02-08 ENCOUNTER — Ambulatory Visit: Payer: Medicare HMO | Admitting: Rehabilitation

## 2014-02-14 ENCOUNTER — Ambulatory Visit (INDEPENDENT_AMBULATORY_CARE_PROVIDER_SITE_OTHER): Payer: Medicare HMO | Admitting: Sports Medicine

## 2014-02-14 ENCOUNTER — Encounter: Payer: Self-pay | Admitting: Sports Medicine

## 2014-02-14 VITALS — BP 127/83 | Ht 70.0 in | Wt 180.0 lb

## 2014-02-14 DIAGNOSIS — S43499A Other sprain of unspecified shoulder joint, initial encounter: Secondary | ICD-10-CM

## 2014-02-14 DIAGNOSIS — S43439A Superior glenoid labrum lesion of unspecified shoulder, initial encounter: Secondary | ICD-10-CM

## 2014-02-14 DIAGNOSIS — S46819A Strain of other muscles, fascia and tendons at shoulder and upper arm level, unspecified arm, initial encounter: Secondary | ICD-10-CM

## 2014-02-14 DIAGNOSIS — M25569 Pain in unspecified knee: Secondary | ICD-10-CM

## 2014-02-14 DIAGNOSIS — M25561 Pain in right knee: Secondary | ICD-10-CM

## 2014-02-14 NOTE — Progress Notes (Signed)
   Subjective:    Patient ID: David [name], male    DOB: 1956/06/29, 58 y.o.   MRN: [id number]  HPI Patient comes in today for followup on his left shoulder and right knee. 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 He has a very small tear to the posterior horn of the lateral meniscus. He continues to have pain particularly after prolonged sitting. Pain is diffuse throughout the knee. He has failed conservative treatment to date including cortisone injection. He is currently in physical therapy for his left shoulder. Previous MRI arthrogram of his left shoulder showed a large a labral tear along with a longitudinal tear of the subscapularis. He developed secondary adhesive capsulitis as a result of this pain. He has plateaued in physical therapy.    Review of Systems     Objective:   Physical Exam Well-developed, well-nourished. No acute distress. Awake alert and oriented x3. Vital signs reviewed.  Left shoulder: Patient has limited active and passive range of motion in all planes including with passive external rotation. He continues to show pain and weakness with rotator cuff stressing as well as a positive clunk.  Right knee: Full range of motion. No effusion. 1+ patellofemoral crepitus. Still some tenderness to palpation along the medial joint line but a negative McMurray's on today's exam. Slight tenderness along the lateral joint line as well. Neurovascularly intact distally.       Assessment & Plan:  1. Left shoulder pain secondary to large labral tear and secondary adhesive capsulitis 2. Right knee pain secondary to patellofemoral DJD  In regards to the left shoulder and right knee I would like for the patient to return to his orthopedist at [facility]. He will likely need a labral repair and manipulation under anesthesia for his left shoulder. In regards to the right knee, I explained to the patient that his  primary issue here is patellofemoral DJD and that arthroscopy would not offer him any guarantees of symptom relief. He understands. Followup with me when necessary.

## 2014-02-26 ENCOUNTER — Ambulatory Visit: Payer: Medicare HMO | Admitting: Physical Therapy

## 2014-03-06 ENCOUNTER — Ambulatory Visit: Payer: Medicare HMO | Admitting: Sports Medicine

## 2014-03-07 ENCOUNTER — Encounter: Payer: Medicare HMO | Admitting: Physical Therapy

## 2014-03-12 ENCOUNTER — Encounter: Payer: Medicare HMO | Admitting: Physical Therapy

## 2014-03-12 ENCOUNTER — Ambulatory Visit: Payer: Medicare HMO | Attending: Sports Medicine | Admitting: Physical Therapy

## 2014-03-12 DIAGNOSIS — IMO0001 Reserved for inherently not codable concepts without codable children: Secondary | ICD-10-CM | POA: Diagnosis present

## 2014-03-12 DIAGNOSIS — M25519 Pain in unspecified shoulder: Secondary | ICD-10-CM | POA: Insufficient documentation

## 2014-03-12 DIAGNOSIS — R293 Abnormal posture: Secondary | ICD-10-CM | POA: Diagnosis not present

## 2014-03-12 DIAGNOSIS — M25619 Stiffness of unspecified shoulder, not elsewhere classified: Secondary | ICD-10-CM | POA: Insufficient documentation

## 2014-03-13 ENCOUNTER — Encounter: Payer: Medicare HMO | Admitting: Physical Therapy

## 2014-03-15 ENCOUNTER — Encounter: Payer: Medicare HMO | Admitting: Physical Therapy

## 2014-03-19 ENCOUNTER — Ambulatory Visit: Payer: Medicare HMO | Admitting: Physical Therapy

## 2014-03-19 DIAGNOSIS — IMO0001 Reserved for inherently not codable concepts without codable children: Secondary | ICD-10-CM | POA: Diagnosis not present

## 2014-03-22 ENCOUNTER — Ambulatory Visit: Payer: Medicare HMO | Admitting: Physical Therapy

## 2014-03-22 DIAGNOSIS — IMO0001 Reserved for inherently not codable concepts without codable children: Secondary | ICD-10-CM | POA: Diagnosis not present

## 2014-03-26 ENCOUNTER — Ambulatory Visit: Payer: Medicare HMO | Admitting: Physical Therapy

## 2014-03-26 DIAGNOSIS — IMO0001 Reserved for inherently not codable concepts without codable children: Secondary | ICD-10-CM | POA: Diagnosis not present

## 2014-03-28 ENCOUNTER — Ambulatory Visit: Payer: Medicare HMO | Admitting: Physical Therapy

## 2014-03-28 DIAGNOSIS — IMO0001 Reserved for inherently not codable concepts without codable children: Secondary | ICD-10-CM | POA: Diagnosis not present

## 2014-04-03 ENCOUNTER — Encounter: Payer: Self-pay | Admitting: Sports Medicine

## 2014-04-03 ENCOUNTER — Ambulatory Visit: Payer: Medicare HMO | Attending: Sports Medicine | Admitting: Physical Therapy

## 2014-04-03 ENCOUNTER — Encounter: Payer: Medicare HMO | Admitting: Physical Therapy

## 2014-04-03 ENCOUNTER — Ambulatory Visit (INDEPENDENT_AMBULATORY_CARE_PROVIDER_SITE_OTHER): Payer: Medicare HMO | Admitting: Sports Medicine

## 2014-04-03 VITALS — BP 125/83 | Ht 70.0 in | Wt 180.0 lb

## 2014-04-03 DIAGNOSIS — M25619 Stiffness of unspecified shoulder, not elsewhere classified: Secondary | ICD-10-CM | POA: Insufficient documentation

## 2014-04-03 DIAGNOSIS — S46819A Strain of other muscles, fascia and tendons at shoulder and upper arm level, unspecified arm, initial encounter: Secondary | ICD-10-CM

## 2014-04-03 DIAGNOSIS — M25519 Pain in unspecified shoulder: Secondary | ICD-10-CM | POA: Insufficient documentation

## 2014-04-03 DIAGNOSIS — S43432D Superior glenoid labrum lesion of left shoulder, subsequent encounter: Secondary | ICD-10-CM

## 2014-04-03 DIAGNOSIS — M25569 Pain in unspecified knee: Secondary | ICD-10-CM

## 2014-04-03 DIAGNOSIS — Z5189 Encounter for other specified aftercare: Secondary | ICD-10-CM

## 2014-04-03 DIAGNOSIS — IMO0001 Reserved for inherently not codable concepts without codable children: Secondary | ICD-10-CM | POA: Insufficient documentation

## 2014-04-03 DIAGNOSIS — R293 Abnormal posture: Secondary | ICD-10-CM | POA: Insufficient documentation

## 2014-04-03 DIAGNOSIS — M25561 Pain in right knee: Secondary | ICD-10-CM

## 2014-04-03 DIAGNOSIS — S43499A Other sprain of unspecified shoulder joint, initial encounter: Secondary | ICD-10-CM

## 2014-04-03 NOTE — Progress Notes (Signed)
   Subjective:    Patient ID: David [name], male    DOB: 1955-12-12, 58 y.o.   MRN: [id number]  HPI Patient comes in today for followup on his left shoulder and right knee. He is doing well in both regards. He saw Dr.Palen at [facility] yesterday who has discharged him. He is ready for discharge from physical therapy as well. He is pleased with his progress to date.    Review of Systems     Objective:   Physical Exam Well-developed, well-nourished. No acute distress  Left shoulder: Active forward flexion is to 140. Active abduction is to about 90. Passive external rotation is about 60. No pain. No weakness. Neurovascularly intact distally.  Right knee: Full range of motion. No effusion. 1+ patellofemoral crepitus. No tenderness to palpation along the medial joint line. No tenderness to palpation along the lateral joint line. Good ligamentous stability. Neurovascularly intact distally. Walking without a limp.      Assessment & Plan:  Left shoulder pain secondary to labral tear and adhesive capsulitis Improved right knee pain secondary to patellofemoral DJD  Dr.Palen has discharged the patient from his care. Patient understands the importance of continuing with his home exercises. Followup with me when necessary.

## 2014-04-05 ENCOUNTER — Ambulatory Visit: Payer: Medicare HMO | Admitting: Physical Therapy

## 2014-04-05 ENCOUNTER — Encounter: Payer: Medicare HMO | Admitting: Physical Therapy

## 2014-06-01 ENCOUNTER — Ambulatory Visit: Payer: Medicare HMO

## 2014-06-07 ENCOUNTER — Encounter: Payer: Self-pay | Admitting: Family Medicine

## 2014-06-07 ENCOUNTER — Ambulatory Visit (INDEPENDENT_AMBULATORY_CARE_PROVIDER_SITE_OTHER): Payer: Medicare HMO | Admitting: Family Medicine

## 2014-06-07 VITALS — BP 133/76 | HR 91 | Temp 97.9°F | Ht 70.0 in | Wt 175.9 lb

## 2014-06-07 DIAGNOSIS — Z23 Encounter for immunization: Secondary | ICD-10-CM

## 2014-06-07 DIAGNOSIS — J029 Acute pharyngitis, unspecified: Secondary | ICD-10-CM

## 2014-06-07 MED ORDER — BENZONATATE 100 MG PO CAPS: 100.0000 mg | ORAL_CAPSULE | Freq: Two times a day (BID) | ORAL | Status: DC | PRN

## 2014-06-07 NOTE — Assessment & Plan Note (Signed)
Likely viral in nature. Currently improving without treatment. Patient reassured today. Advised supportive care/conservative measures.  Will treat associated cough with Tessalon Perles.

## 2014-06-07 NOTE — Progress Notes (Signed)
   Subjective:    Patient ID: David [name], male    DOB: 06/09/1956, 58 y.o.   MRN: [id number]  HPI 58 year old male presents for same day appointment with complaints of sore throat and cough.  1) Sore Throat and Cough  Patient reports that he developed sore throat cough on Sunday.  Cough is nonproductive.  Sore throat described as "scratchy".  No associated fevers, chills, nausea, vomiting, shortness of breath, otalgia.  No exacerbating or relieving factors. No interventions tried.  He reports it has been slowly improving since early this week.  Review of Systems Per HPI    Objective:   Physical Exam Filed Vitals:   06/07/14 0935  BP: 133/76  Pulse: 91  Temp: 97.9 F (36.6 C)   Exam: General: well appearing, NAD. HEENT: NCAT. Oropharynx clear. No exudate noted. TM's normal bilaterally.  Cardiovascular: RRR. No murmurs, rubs, or gallops. Respiratory: CTAB. No rales, rhonchi, or wheeze.    Assessment & Plan:  See Problem List

## 2014-06-07 NOTE — Patient Instructions (Signed)
It was nice to see you today.  Your sore throat and cough is likely viral.  Continue supportive care - rest, fluids, etc.  I have given you something for cough if you need it.  Follow up as needed.  Take care,  Thersa Salt DO

## 2014-09-18 ENCOUNTER — Emergency Department (HOSPITAL_COMMUNITY)
Admission: EM | Admit: 2014-09-18 | Discharge: 2014-09-18 | Disposition: A | Discharge status: Home / Self Care | Payer: Medicare HMO | Attending: Emergency Medicine | Admitting: Emergency Medicine

## 2014-09-18 ENCOUNTER — Encounter (HOSPITAL_COMMUNITY): Payer: Self-pay | Admitting: Emergency Medicine

## 2014-09-18 DIAGNOSIS — Z7982 Long term (current) use of aspirin: Secondary | ICD-10-CM | POA: Diagnosis not present

## 2014-09-18 DIAGNOSIS — Z791 Long term (current) use of non-steroidal anti-inflammatories (NSAID): Secondary | ICD-10-CM | POA: Diagnosis not present

## 2014-09-18 DIAGNOSIS — Z79891 Long term (current) use of opiate analgesic: Secondary | ICD-10-CM | POA: Insufficient documentation

## 2014-09-18 DIAGNOSIS — M545 Low back pain, unspecified: Secondary | ICD-10-CM

## 2014-09-18 DIAGNOSIS — Z79899 Other long term (current) drug therapy: Secondary | ICD-10-CM | POA: Diagnosis not present

## 2014-09-18 DIAGNOSIS — I10 Essential (primary) hypertension: Secondary | ICD-10-CM | POA: Diagnosis not present

## 2014-09-18 DIAGNOSIS — M1990 Unspecified osteoarthritis, unspecified site: Secondary | ICD-10-CM | POA: Diagnosis not present

## 2014-09-18 MED ORDER — METHOCARBAMOL 750 MG PO TABS: 750.0000 mg | ORAL_TABLET | Freq: Four times a day (QID) | ORAL | Status: DC

## 2014-09-18 MED ORDER — PREDNISONE 10 MG PO TABS: 20.0000 mg | ORAL_TABLET | Freq: Every day | ORAL | Status: DC

## 2014-09-18 MED ORDER — OXYCODONE-ACETAMINOPHEN 5-325 MG PO TABS: 1.0000 | ORAL_TABLET | ORAL | Status: DC | PRN

## 2014-09-18 MED ORDER — KETOROLAC TROMETHAMINE 60 MG/2ML IM SOLN
60.0000 mg | Freq: Once | INTRAMUSCULAR | Status: AC
  Administered 2014-09-18: 60 mg via INTRAMUSCULAR
  Filled 2014-09-18: qty 2

## 2014-09-18 NOTE — ED Notes (Addendum)
Pt reports lower back pain for "months" and today has worsened to the point he cannot "get up from the chair." Pt having difficult time lifting legs as he lies down on stretcher. Denies numbness and tingling in extremities. Lumbar and sacral tenderness on palpation. Alert and oriented x4, speaking in clear sentences.

## 2014-09-18 NOTE — ED Provider Notes (Signed)
CSN: [id number]     Arrival date & time 09/18/14  [id number] History   First MD Initiated Contact with Patient 09/18/14 0930     Chief Complaint  Patient presents with  . Back Pain     (Consider location/radiation/quality/duration/timing/severity/associated sxs/prior Treatment) HPI Comments: Issue here complaining of several months of lower back pain became worse yesterday when he had trouble rising from a seated position. Pain characterized as dull and located to his lumbar paraspinal region without radiation to his legs. No change in bowel or bladder function. Denies any saddle anesthesias. No recent weight loss. No fever noted. Using over-the-counter medications without relief. Denies any prior history of back surgery. No dysuria or hematuria. Symptoms better with rest.  Patient is a 59 y.o. male presenting with back pain. The history is provided by the patient.  Back Pain   Past Medical History  Diagnosis Date  . Arthritis   . Hypertension    Past Surgical History  Procedure Laterality Date  . Shoulder arthroscopy w/ rotator cuff repair  10/21/10    At [place], R shoulder   History reviewed. No pertinent family history. History  Substance Use Topics  . Smoking status: Never Smoker   . Smokeless tobacco: Never Used  . Alcohol Use: Yes     Comment: occasion    Review of Systems  Musculoskeletal: Positive for back pain.  All other systems reviewed and are negative.     Allergies  Review of patient's allergies indicates no known allergies.  Home Medications   Prior to Admission medications   Medication Sig Start Date End Date Taking? Authorizing Provider  Aspirin (ADULT ASPIRIN LOW STRENGTH) 81 MG EC tablet Take 1 tablet (81 mg total) by mouth daily. 02/11/11   Silverio Decamp, MD  benzonatate (TESSALON) 100 MG capsule Take 1 capsule (100 mg total) by mouth 2 (two) times daily as needed for cough. 06/07/14   Coral Spikes, DO  desloratadine (CLARINEX) 5 MG tablet Take 5 mg  by mouth.    Historical Provider, MD  diclofenac (VOLTAREN) 75 MG EC tablet TAKE 1 TABLET BY MOUTH TWICE A DAY 11/23/13   Andrena Mews, MD  diclofenac (VOLTAREN) 75 MG EC tablet TAKE 1 TABLET BY MOUTH TWICE A DAY 11/23/13   Historical Provider, MD  docusate sodium (COLACE) 100 MG capsule Take 100 mg by mouth. 10/13/13   Historical Provider, MD  meclizine (ANTIVERT) 12.5 MG tablet Take 1 tablet (12.5 mg total) by mouth 3 (three) times daily as needed for dizziness. 11/21/13   Gurinder Toral Norris, MD  oxyCODONE-acetaminophen (PERCOCET/ROXICET) 5-325 MG per tablet Take by mouth every 4 (four) hours as needed for severe pain. Per ortho    Historical Provider, MD  oxyCODONE-acetaminophen (ROXICET) 5-325 MG per tablet Take 1 tablet by mouth. 03/12/14   Historical Provider, MD  tamsulosin (FLOMAX) 0.4 MG CAPS capsule Take 1 capsule (0.4 mg total) by mouth daily. 10/19/13   Andrena Mews, MD  tamsulosin (FLOMAX) 0.4 MG CAPS capsule Take 0.4 mg by mouth.    Historical Provider, MD   BP 156/102 mmHg  Pulse 77  Temp(Src) 97.6 F (36.4 C) (Oral)  Resp 18  SpO2 100% Physical Exam  Constitutional: He is oriented to person, place, and time. He appears well-developed and well-nourished.  Non-toxic appearance. No distress.  HENT:  Head: Normocephalic and atraumatic.  Eyes: Conjunctivae, EOM and lids are normal. Pupils are equal, round, and reactive to light.  Neck: Normal range of motion. Neck supple. No  tracheal deviation present. No thyroid mass present.  Cardiovascular: Normal rate, regular rhythm and normal heart sounds.  Exam reveals no gallop.   No murmur heard. Pulmonary/Chest: Effort normal and breath sounds normal. No stridor. No respiratory distress. He has no decreased breath sounds. He has no wheezes. He has no rhonchi. He has no rales.  Abdominal: Soft. Normal appearance and bowel sounds are normal. He exhibits no distension. There is no tenderness. There is no rebound and no CVA tenderness.    Musculoskeletal: Normal range of motion. He exhibits no edema or tenderness.       Back:  Neurological: He is alert and oriented to person, place, and time. He has normal strength. No cranial nerve deficit or sensory deficit. GCS eye subscore is 4. GCS verbal subscore is 5. GCS motor subscore is 6.  Skin: Skin is warm and dry. No abrasion and no rash noted.  Psychiatric: He has a normal mood and affect. His speech is normal and behavior is normal.  Nursing note and vitals reviewed.   ED Course  Procedures (including critical care time) Labs Review Labs Reviewed - No data to display  Imaging Review No results found.   EKG Interpretation None      MDM   Final diagnoses:  None   Patient with likely muscular skeletal back pain. Neurological exam is normal. No warning signs of cauda equina. Will be treated conservatively return precautions given    Leota Jacobsen, MD 09/18/14 [phone]

## 2014-09-18 NOTE — Discharge Instructions (Signed)
Back Pain, Adult Low back pain is very common. About 1 in 5 people have back pain.The cause of low back pain is rarely dangerous. The pain often gets better over time.About half of people with a sudden onset of back pain feel better in just 2 weeks. About 8 in 10 people feel better by 6 weeks.  CAUSES Some common causes of back pain include:  Strain of the muscles or ligaments supporting the spine.  Wear and tear (degeneration) of the spinal discs.  Arthritis.  Direct injury to the back. DIAGNOSIS Most of the time, the direct cause of low back pain is not known.However, back pain can be treated effectively even when the exact cause of the pain is unknown.Answering your caregiver's questions about your overall health and symptoms is one of the most accurate ways to make sure the cause of your pain is not dangerous. If your caregiver needs more information, he or she may order lab work or imaging tests (X-rays or MRIs).However, even if imaging tests show changes in your back, this usually does not require surgery. HOME CARE INSTRUCTIONS For many people, back pain returns.Since low back pain is rarely dangerous, it is often a condition that people can learn to manageon their own.   Remain active. It is stressful on the back to sit or stand in one place. Do not sit, drive, or stand in one place for more than 30 minutes at a time. Take short walks on level surfaces as soon as pain allows.Try to increase the length of time you walk each day.  Do not stay in bed.Resting more than 1 or 2 days can delay your recovery.  Do not avoid exercise or work.Your body is made to move.It is not dangerous to be active, even though your back may hurt.Your back will likely heal faster if you return to being active before your pain is gone.  Pay attention to your body when you bend and lift. Many people have less discomfortwhen lifting if they bend their knees, keep the load close to their bodies,and  avoid twisting. Often, the most comfortable positions are those that put less stress on your recovering back.  Find a comfortable position to sleep. Use a firm mattress and lie on your side with your knees slightly bent. If you lie on your back, put a pillow under your knees.  Only take over-the-counter or prescription medicines as directed by your caregiver. Over-the-counter medicines to reduce pain and inflammation are often the most helpful.Your caregiver may prescribe muscle relaxant drugs.These medicines help dull your pain so you can more quickly return to your normal activities and healthy exercise.  Put ice on the injured area.  Put ice in a plastic bag.  Place a towel between your skin and the bag.  Leave the ice on for 15-20 minutes, 03-04 times a day for the first 2 to 3 days. After that, ice and heat may be alternated to reduce pain and spasms.  Ask your caregiver about trying back exercises and gentle massage. This may be of some benefit.  Avoid feeling anxious or stressed.Stress increases muscle tension and can worsen back pain.It is important to recognize when you are anxious or stressed and learn ways to manage it.Exercise is a great option. SEEK MEDICAL CARE IF:  You have pain that is not relieved with rest or medicine.  You have pain that does not improve in 1 week.  You have new symptoms.  You are generally not feeling well. SEEK   IMMEDIATE MEDICAL CARE IF:   You have pain that radiates from your back into your legs.  You develop new bowel or bladder control problems.  You have unusual weakness or numbness in your arms or legs.  You develop nausea or vomiting.  You develop abdominal pain.  You feel faint. Document Released: 08/17/2005 Document Revised: 02/16/2012 Document Reviewed: 12/19/2013 Exit[employer] Patient Information 2015 [employer]. This information is not intended to replace advice given to you by your health care provider. Make sure you  discuss any questions you have with your health care provider.  

## 2014-11-08 ENCOUNTER — Encounter (HOSPITAL_COMMUNITY): Payer: Self-pay | Admitting: Emergency Medicine

## 2014-11-08 DIAGNOSIS — Z7982 Long term (current) use of aspirin: Secondary | ICD-10-CM | POA: Insufficient documentation

## 2014-11-08 DIAGNOSIS — Z7952 Long term (current) use of systemic steroids: Secondary | ICD-10-CM | POA: Insufficient documentation

## 2014-11-08 DIAGNOSIS — Z79899 Other long term (current) drug therapy: Secondary | ICD-10-CM | POA: Diagnosis not present

## 2014-11-08 DIAGNOSIS — M5441 Lumbago with sciatica, right side: Secondary | ICD-10-CM | POA: Insufficient documentation

## 2014-11-08 DIAGNOSIS — M545 Low back pain: Secondary | ICD-10-CM | POA: Diagnosis present

## 2014-11-08 DIAGNOSIS — M1990 Unspecified osteoarthritis, unspecified site: Secondary | ICD-10-CM | POA: Diagnosis not present

## 2014-11-08 DIAGNOSIS — I10 Essential (primary) hypertension: Secondary | ICD-10-CM | POA: Insufficient documentation

## 2014-11-09 ENCOUNTER — Emergency Department (HOSPITAL_COMMUNITY)
Admission: EM | Admit: 2014-11-09 | Discharge: 2014-11-09 | Disposition: A | Discharge status: Home / Self Care | Payer: Medicare HMO | Attending: Emergency Medicine | Admitting: Emergency Medicine

## 2014-11-09 DIAGNOSIS — M5441 Lumbago with sciatica, right side: Secondary | ICD-10-CM

## 2014-11-09 MED ORDER — OXYCODONE-ACETAMINOPHEN 5-325 MG PO TABS: 1.0000 | ORAL_TABLET | ORAL | Status: DC | PRN

## 2014-11-09 MED ORDER — CYCLOBENZAPRINE HCL 10 MG PO TABS
10.0000 mg | ORAL_TABLET | Freq: Once | ORAL | Status: AC
  Administered 2014-11-09: 10 mg via ORAL
  Filled 2014-11-09: qty 1

## 2014-11-09 MED ORDER — OXYCODONE-ACETAMINOPHEN 5-325 MG PO TABS
1.0000 | ORAL_TABLET | Freq: Once | ORAL | Status: AC
  Administered 2014-11-09: 1 via ORAL
  Filled 2014-11-09: qty 1

## 2014-11-09 MED ORDER — CYCLOBENZAPRINE HCL 10 MG PO TABS: 10.0000 mg | ORAL_TABLET | Freq: Three times a day (TID) | ORAL | Status: DC | PRN

## 2014-11-09 NOTE — ED Provider Notes (Signed)
CSN: [id number]     Arrival date & time 11/08/14  2257 History  This chart was scribed for Delora Fuel, MD by Molli Posey, ED Scribe. This patient was seen in room A01C/A01C and the patient's care was started 1:02 AM.    Chief Complaint  Patient presents with  . Back Pain   The history is provided by the patient. No language interpreter was used.   HPI Comments: David [name] is a 59 y.o. male with a history of arthritis and HTN who presents to the Emergency Department complaining of intermittent, lower back pain for the last couple of years that worsened last night. He rates his pain as a 10/10 and says his pain worsens when he gets up to stand. Pt states that when he lays down his pain improves. He states that last night he was taking the trash out when he twisted and exacerbated his pain. He states that he has tried no treatments or medications to try and improve his pain. Pt reports no history of drinking or smoking. He states that he is still taking voltaren which was prescribed for his right wrist. Pt reports NKDA. He denies any numbness, tingling, bladder or bowel incontinence.   Past Medical History  Diagnosis Date  . Arthritis   . Hypertension    Past Surgical History  Procedure Laterality Date  . Shoulder arthroscopy w/ rotator cuff repair  10/21/10    At [place], R shoulder   No family history on file. History  Substance Use Topics  . Smoking status: Never Smoker   . Smokeless tobacco: Never Used  . Alcohol Use: Yes     Comment: occasion    Review of Systems  Musculoskeletal: Positive for myalgias and back pain.  Neurological: Negative for numbness.  All other systems reviewed and are negative.     Allergies  Review of patient's allergies indicates no known allergies.  Home Medications   Prior to Admission medications   Medication Sig Start Date End Date Taking? Authorizing Provider  Aspirin (ADULT ASPIRIN LOW STRENGTH) 81 MG EC tablet Take 1 tablet (81 mg  total) by mouth daily. 02/11/11   Silverio Decamp, MD  benzonatate (TESSALON) 100 MG capsule Take 1 capsule (100 mg total) by mouth 2 (two) times daily as needed for cough. Patient not taking: Reported on 09/18/2014 06/07/14   Coral Spikes, DO  diclofenac (VOLTAREN) 75 MG EC tablet TAKE 1 TABLET BY MOUTH TWICE A DAY 11/23/13   Kinnie Feil, MD  meclizine (ANTIVERT) 12.5 MG tablet Take 1 tablet (12.5 mg total) by mouth 3 (three) times daily as needed for dizziness. 11/21/13   Allen Norris, MD  methocarbamol (ROBAXIN-750) 750 MG tablet Take 1 tablet (750 mg total) by mouth 4 (four) times daily. 09/18/14   Lacretia Leigh, MD  oxyCODONE-acetaminophen (PERCOCET/ROXICET) 5-325 MG per tablet Take 1-2 tablets by mouth every 4 (four) hours as needed for moderate pain or severe pain. 09/18/14   Lacretia Leigh, MD  predniSONE (DELTASONE) 10 MG tablet Take 2 tablets (20 mg total) by mouth daily. 09/18/14   Lacretia Leigh, MD  tamsulosin (FLOMAX) 0.4 MG CAPS capsule Take 1 capsule (0.4 mg total) by mouth daily. 10/19/13   Kinnie Feil, MD   BP 145/99 mmHg  Pulse 93  Temp(Src) 98.4 F (36.9 C) (Oral)  Resp 18  Ht 5\' 10"  (1.778 m)  Wt 165 lb (74.844 kg)  BMI 23.68 kg/m2  SpO2 100% Physical Exam  Constitutional: He  is oriented to person, place, and time. He appears well-developed and well-nourished.  HENT:  Head: Normocephalic and atraumatic.  Eyes: Pupils are equal, round, and reactive to light. Right eye exhibits no discharge. Left eye exhibits no discharge.  Neck: Normal range of motion. Neck supple. No JVD present.  Cardiovascular: Normal rate, regular rhythm and normal heart sounds.   No murmur heard. Pulmonary/Chest: Effort normal and breath sounds normal. He has no wheezes. He has no rales. He exhibits no tenderness.  Abdominal: Soft. Bowel sounds are normal. He exhibits no distension. There is no tenderness.  Musculoskeletal: He exhibits tenderness. He exhibits no edema.  Moderate  tenderness mid and lower lumbar spine. Moderate left paralumbar spasm and tenderness. Positive straight leg raise on the right at 45 degrees.   Lymphadenopathy:    He has no cervical adenopathy.  Neurological: He is alert and oriented to person, place, and time. No cranial nerve deficit. He exhibits normal muscle tone. Coordination normal.  Skin: Skin is warm and dry. No rash noted.  Psychiatric: He has a normal mood and affect. His behavior is normal. Thought content normal.  Nursing note and vitals reviewed.   ED Course  Procedures   DIAGNOSTIC STUDIES: Oxygen Saturation is 100% on RA, normal by my interpretation.    COORDINATION OF CARE: 1:10 AM Discussed treatment plan with pt at bedside and pt agreed to plan.   MDM   Final diagnoses:  Left-sided low back pain with right-sided sciatica    Exacerbation of chronic back pain. Old records reviewed and he had an MRI of the lumbar spine in 2012 showing some degenerative changes. He is discharged with prescription for cyclobenzaprine and oxycodone-acetaminophen and referred back to his PCP.   I personally performed the services described in this documentation, which was scribed in my presence. The recorded information has been reviewed and is accurate.       Delora Fuel, MD [id number]

## 2014-11-09 NOTE — Discharge Instructions (Signed)
Back Pain, Adult °Low back pain is very common. About 1 in 5 people have back pain. The cause of low back pain is rarely dangerous. The pain often gets better over time. About half of people with a sudden onset of back pain feel better in just 2 weeks. About 8 in 10 people feel better by 6 weeks.  °CAUSES °Some common causes of back pain include: °· Strain of the muscles or ligaments supporting the spine. °· Wear and tear (degeneration) of the spinal discs. °· Arthritis. °· Direct injury to the back. °DIAGNOSIS °Most of the time, the direct cause of low back pain is not known. However, back pain can be treated effectively even when the exact cause of the pain is unknown. Answering your caregiver's questions about your overall health and symptoms is one of the most accurate ways to make sure the cause of your pain is not dangerous. If your caregiver needs more information, he or she may order lab work or imaging tests (X-rays or MRIs). However, even if imaging tests show changes in your back, this usually does not require surgery. °HOME CARE INSTRUCTIONS °For many people, back pain returns. Since low back pain is rarely dangerous, it is often a condition that people can learn to manage on their own.  °· Remain active. It is stressful on the back to sit or stand in one place. Do not sit, drive, or stand in one place for more than 30 minutes at a time. Take short walks on level surfaces as soon as pain allows. Try to increase the length of time you walk each day. °· Do not stay in bed. Resting more than 1 or 2 days can delay your recovery. °· Do not avoid exercise or work. Your body is made to move. It is not dangerous to be active, even though your back may hurt. Your back will likely heal faster if you return to being active before your pain is gone. °· Pay attention to your body when you  bend and lift. Many people have less discomfort when lifting if they bend their knees, keep the load close to their bodies, and  avoid twisting. Often, the most comfortable positions are those that put less stress on your recovering back. °· Find a comfortable position to sleep. Use a firm mattress and lie on your side with your knees slightly bent. If you lie on your back, put a pillow under your knees. °· Only take over-the-counter or prescription medicines as directed by your caregiver. Over-the-counter medicines to reduce pain and inflammation are often the most helpful. Your caregiver may prescribe muscle relaxant drugs. These medicines help dull your pain so you can more quickly return to your normal activities and healthy exercise. °· Put ice on the injured area. °¨ Put ice in a plastic bag. °¨ Place a towel between your skin and the bag. °¨ Leave the ice on for 15-20 minutes, 03-04 times a day for the first 2 to 3 days. After that, ice and heat may be alternated to reduce pain and spasms. °· Ask your caregiver about trying back exercises and gentle massage. This may be of some benefit. °· Avoid feeling anxious or stressed. Stress increases muscle tension and can worsen back pain. It is important to recognize when you are anxious or stressed and learn ways to manage it. Exercise is a great option. °SEEK MEDICAL CARE IF: °· You have pain that is not relieved with rest or medicine. °· You have pain that does not improve in 1 week. °· You have new symptoms. °· You are generally not feeling well. °SEEK   IMMEDIATE MEDICAL CARE IF:   You have pain that radiates from your back into your legs.  You develop new bowel or bladder control problems.  You have unusual weakness or numbness in your arms or legs.  You develop nausea or vomiting.  You develop abdominal pain.  You feel faint. Document Released: 08/17/2005 Document Revised: 02/16/2012 Document Reviewed: 12/19/2013 [facility] Patient Information 2015 [place], [place]. This information is not intended to replace advice given to you by your health care provider. Make sure you  discuss any questions you have with your health care provider.  Acetaminophen; Oxycodone tablets What is this medicine? ACETAMINOPHEN; OXYCODONE (a set a MEE noe fen; ox i KOE done) is a pain reliever. It is used to treat mild to moderate pain. This medicine may be used for other purposes; ask your health care provider or pharmacist if you have questions. COMMON BRAND NAME(S): Endocet, Magnacet, Narvox, Percocet, Perloxx, Primalev, Primlev, Roxicet, Xolox What should I tell my health care provider before I take this medicine? They need to know if you have any of these conditions: -brain tumor -Crohn's disease, inflammatory bowel disease, or ulcerative colitis -drug abuse or addiction -head injury -heart or circulation problems -if you often drink alcohol -kidney disease or problems going to the bathroom -liver disease -lung disease, asthma, or breathing problems -an unusual or allergic reaction to acetaminophen, oxycodone, other opioid analgesics, other medicines, foods, dyes, or preservatives -pregnant or trying to get pregnant -breast-feeding How should I use this medicine? Take this medicine by mouth with a full glass of water. Follow the directions on the prescription label. Take your medicine at regular intervals. Do not take your medicine more often than directed. Talk to your pediatrician regarding the use of this medicine in children. Special care may be needed. Patients over 42 years old may have a stronger reaction and need a smaller dose. Overdosage: If you think you have taken too much of this medicine contact a poison control center or emergency room at once. NOTE: This medicine is only for you. Do not share this medicine with others. What if I miss a dose? If you miss a dose, take it as soon as you can. If it is almost time for your next dose, take only that dose. Do not take double or extra doses. What may interact with this  medicine? -alcohol -antihistamines -barbiturates like amobarbital, butalbital, butabarbital, methohexital, pentobarbital, phenobarbital, thiopental, and secobarbital -benztropine -drugs for bladder problems like solifenacin, trospium, oxybutynin, tolterodine, hyoscyamine, and methscopolamine -drugs for breathing problems like ipratropium and tiotropium -drugs for certain stomach or intestine problems like propantheline, homatropine methylbromide, glycopyrrolate, atropine, belladonna, and dicyclomine -general anesthetics like etomidate, ketamine, nitrous oxide, propofol, desflurane, enflurane, halothane, isoflurane, and sevoflurane -medicines for depression, anxiety, or psychotic disturbances -medicines for sleep -muscle relaxants -naltrexone -narcotic medicines (opiates) for pain -phenothiazines like perphenazine, thioridazine, chlorpromazine, mesoridazine, fluphenazine, prochlorperazine, promazine, and trifluoperazine -scopolamine -tramadol -trihexyphenidyl This list may not describe all possible interactions. Give your health care provider a list of all the medicines, herbs, non-prescription drugs, or dietary supplements you use. Also tell them if you smoke, drink alcohol, or use illegal drugs. Some items may interact with your medicine. What should I watch for while using this medicine? Tell your doctor or health care professional if your pain does not go away, if it gets worse, or if you have new or a different type of pain. You may develop tolerance to the medicine. Tolerance means that you will need a higher dose of  the medication for pain relief. Tolerance is normal and is expected if you take this medicine for a long time. Do not suddenly stop taking your medicine because you may develop a severe reaction. Your body becomes used to the medicine. This does NOT mean you are addicted. Addiction is a behavior related to getting and using a drug for a non-medical reason. If you have pain, you  have a medical reason to take pain medicine. Your doctor will tell you how much medicine to take. If your doctor wants you to stop the medicine, the dose will be slowly lowered over time to avoid any side effects. You may get drowsy or dizzy. Do not drive, use machinery, or do anything that needs mental alertness until you know how this medicine affects you. Do not stand or sit up quickly, especially if you are an older patient. This reduces the risk of dizzy or fainting spells. Alcohol may interfere with the effect of this medicine. Avoid alcoholic drinks. There are different types of narcotic medicines (opiates) for pain. If you take more than one type at the same time, you may have more side effects. Give your health care provider a list of all medicines you use. Your doctor will tell you how much medicine to take. Do not take more medicine than directed. Call emergency for help if you have problems breathing. The medicine will cause constipation. Try to have a bowel movement at least every 2 to 3 days. If you do not have a bowel movement for 3 days, call your doctor or health care professional. Do not take Tylenol (acetaminophen) or medicines that have acetaminophen with this medicine. Too much acetaminophen can be very dangerous. Many nonprescription medicines contain acetaminophen. Always read the labels carefully to avoid taking more acetaminophen. What side effects may I notice from receiving this medicine? Side effects that you should report to your doctor or health care professional as soon as possible: -allergic reactions like skin rash, itching or hives, swelling of the face, lips, or tongue -breathing difficulties, wheezing -confusion -light headedness or fainting spells -severe stomach pain -unusually weak or tired -yellowing of the skin or the whites of the eyes Side effects that usually do not require medical attention (report to your doctor or health care professional if they continue  or are bothersome): -dizziness -drowsiness -nausea -vomiting This list may not describe all possible side effects. Call your doctor for medical advice about side effects. You may report side effects to FDA at 1-800-FDA-1088. Where should I keep my medicine? Keep out of the reach of children. This medicine can be abused. Keep your medicine in a safe place to protect it from theft. Do not share this medicine with anyone. Selling or giving away this medicine is dangerous and against the law. Store at room temperature between 20 and 25 degrees C (68 and 77 degrees F). Keep container tightly closed. Protect from light. This medicine may cause accidental overdose and death if it is taken by other adults, children, or pets. Flush any unused medicine down the toilet to reduce the chance of harm. Do not use the medicine after the expiration date. NOTE: This sheet is a summary. It may not cover all possible information. If you have questions about this medicine, talk to your doctor, pharmacist, or health care provider.  2015, Elsevier/Gold Standard. (2013-04-10 13:17:35)  Cyclobenzaprine tablets What is this medicine? CYCLOBENZAPRINE (sye kloe BEN za preen) is a muscle relaxer. It is used to treat muscle pain, spasms,  and stiffness. This medicine may be used for other purposes; ask your health care provider or pharmacist if you have questions. COMMON BRAND NAME(S): Fexmid, Flexeril What should I tell my health care provider before I take this medicine? They need to know if you have any of these conditions: -heart disease, irregular heartbeat, or previous heart attack -liver disease -thyroid problem -an unusual or allergic reaction to cyclobenzaprine, tricyclic antidepressants, lactose, other medicines, foods, dyes, or preservatives -pregnant or trying to get pregnant -breast-feeding How should I use this medicine? Take this medicine by mouth with a glass of water. Follow the directions on the  prescription label. If this medicine upsets your stomach, take it with food or milk. Take your medicine at regular intervals. Do not take it more often than directed. Talk to your pediatrician regarding the use of this medicine in children. Special care may be needed. Overdosage: If you think you have taken too much of this medicine contact a poison control center or emergency room at once. NOTE: This medicine is only for you. Do not share this medicine with others. What if I miss a dose? If you miss a dose, take it as soon as you can. If it is almost time for your next dose, take only that dose. Do not take double or extra doses. What may interact with this medicine? Do not take this medicine with any of the following medications: -certain medicines for fungal infections like fluconazole, itraconazole, ketoconazole, posaconazole, voriconazole -cisapride -dofetilide -dronedarone -droperidol -flecainide -grepafloxacin -halofantrine -levomethadyl -MAOIs like Carbex, Eldepryl, Marplan, Nardil, and Parnate -nilotinib -pimozide -probucol -sertindole -thioridazine -ziprasidone This medicine may also interact with the following medications: -abarelix -alcohol -certain medicines for cancer -certain medicines for depression, anxiety, or psychotic disturbances -certain medicines for infection like alfuzosin, chloroquine, clarithromycin, levofloxacin, mefloquine, pentamidine, troleandomycin -certain medicines for an irregular heart beat -certain medicines used for sleep or numbness during surgery or procedure -contrast dyes -dolasetron -guanethidine -methadone -octreotide -ondansetron -other medicines that prolong the QT interval (cause an abnormal heart rhythm) -palonosetron -phenothiazines like chlorpromazine, mesoridazine, prochlorperazine, thioridazine -tramadol -vardenafil This list may not describe all possible interactions. Give your health care provider a list of all the  medicines, herbs, non-prescription drugs, or dietary supplements you use. Also tell them if you smoke, drink alcohol, or use illegal drugs. Some items may interact with your medicine. What should I watch for while using this medicine? Check with your doctor or health care professional if your condition does not improve within 1 to 3 weeks. You may get drowsy or dizzy when you first start taking the medicine or change doses. Do not drive, use machinery, or do anything that may be dangerous until you know how the medicine affects you. Stand or sit up slowly. Your mouth may get dry. Drinking water, chewing sugarless gum, or sucking on hard candy may help. What side effects may I notice from receiving this medicine? Side effects that you should report to your doctor or health care professional as soon as possible: -allergic reactions like skin rash, itching or hives, swelling of the face, lips, or tongue -chest pain -fast heartbeat -hallucinations -seizures -vomiting Side effects that usually do not require medical attention (report to your doctor or health care professional if they continue or are bothersome): -headache This list may not describe all possible side effects. Call your doctor for medical advice about side effects. You may report side effects to FDA at 1-800-FDA-1088. Where should I keep my medicine? Keep out of the  reach of children. Store at room temperature between 15 and 30 degrees C (59 and 86 degrees F). Keep container tightly closed. Throw away any unused medicine after the expiration date. NOTE: This sheet is a summary. It may not cover all possible information. If you have questions about this medicine, talk to your doctor, pharmacist, or health care provider.  2015, Elsevier/Gold Standard. (2013-03-14 12:48:19)

## 2014-11-12 ENCOUNTER — Other Ambulatory Visit: Payer: Self-pay | Admitting: Family Medicine

## 2014-11-12 NOTE — Telephone Encounter (Signed)
Has a followup with dr Gwendlyn Deutscher on march 29 Will run out of his meds prior to the appt Would like to get meds to have before march 29

## 2014-11-12 NOTE — Telephone Encounter (Signed)
Refill request. Spoke with pt and he stated he needs refills for Percocet (has 2 pills left) and Flexeril. Wandalee Klang, CMA.

## 2014-11-12 NOTE — Telephone Encounter (Signed)
Please let patient know I have not prescribed this medication to him and do not intend to if not needed. Who does he normally get it from. He might need referral to pain clinic.

## 2014-11-13 NOTE — Telephone Encounter (Signed)
He can get referral to pain clinic, we need to understand why she need this medication.

## 2014-11-13 NOTE — Telephone Encounter (Signed)
Will discuss with patient at his follow up appt. David [name],CMA

## 2014-11-13 NOTE — Telephone Encounter (Signed)
Spoke with patient and she would like to know if he is able to see someone else for these refills.  I informed him that he was being seen by you for the hospital follow up and it would be better to be evaluated by you.  Please advise. Jazmin Hartsell,CMA

## 2014-11-27 ENCOUNTER — Ambulatory Visit: Payer: Medicare HMO | Admitting: Family Medicine

## 2014-12-04 ENCOUNTER — Ambulatory Visit: Payer: Medicare HMO | Admitting: Family Medicine

## 2014-12-11 ENCOUNTER — Ambulatory Visit: Payer: Medicare HMO | Admitting: Family Medicine

## 2015-01-04 ENCOUNTER — Encounter (HOSPITAL_COMMUNITY): Payer: Self-pay | Admitting: Emergency Medicine

## 2015-01-04 ENCOUNTER — Emergency Department (HOSPITAL_COMMUNITY)
Admission: EM | Admit: 2015-01-04 | Discharge: 2015-01-04 | Disposition: A | Discharge status: Home / Self Care | Payer: Medicare HMO | Attending: Emergency Medicine | Admitting: Emergency Medicine

## 2015-01-04 DIAGNOSIS — Z79899 Other long term (current) drug therapy: Secondary | ICD-10-CM | POA: Insufficient documentation

## 2015-01-04 DIAGNOSIS — I10 Essential (primary) hypertension: Secondary | ICD-10-CM | POA: Diagnosis not present

## 2015-01-04 DIAGNOSIS — M545 Low back pain: Secondary | ICD-10-CM

## 2015-01-04 DIAGNOSIS — M1990 Unspecified osteoarthritis, unspecified site: Secondary | ICD-10-CM | POA: Insufficient documentation

## 2015-01-04 DIAGNOSIS — G8929 Other chronic pain: Secondary | ICD-10-CM | POA: Insufficient documentation

## 2015-01-04 DIAGNOSIS — Z7982 Long term (current) use of aspirin: Secondary | ICD-10-CM | POA: Insufficient documentation

## 2015-01-04 DIAGNOSIS — Z791 Long term (current) use of non-steroidal anti-inflammatories (NSAID): Secondary | ICD-10-CM | POA: Insufficient documentation

## 2015-01-04 MED ORDER — METHOCARBAMOL 500 MG PO TABS
500.0000 mg | ORAL_TABLET | Freq: Once | ORAL | Status: AC
  Administered 2015-01-04: 500 mg via ORAL
  Filled 2015-01-04: qty 1

## 2015-01-04 MED ORDER — METHOCARBAMOL 500 MG PO TABS: 500.0000 mg | ORAL_TABLET | Freq: Three times a day (TID) | ORAL | Status: DC | PRN

## 2015-01-04 MED ORDER — OXYCODONE-ACETAMINOPHEN 5-325 MG PO TABS
2.0000 | ORAL_TABLET | Freq: Once | ORAL | Status: AC
  Administered 2015-01-04: 2 via ORAL
  Filled 2015-01-04: qty 2

## 2015-01-04 MED ORDER — KETOROLAC TROMETHAMINE 60 MG/2ML IM SOLN
60.0000 mg | Freq: Once | INTRAMUSCULAR | Status: AC
  Administered 2015-01-04: 60 mg via INTRAMUSCULAR
  Filled 2015-01-04: qty 2

## 2015-01-04 MED ORDER — IBUPROFEN 600 MG PO TABS: 600.0000 mg | ORAL_TABLET | Freq: Three times a day (TID) | ORAL | Status: DC | PRN

## 2015-01-04 NOTE — ED Notes (Signed)
Pt verbalized understanding of d/c instructions and has no further questions.

## 2015-01-04 NOTE — ED Provider Notes (Signed)
CSN: [id number]     Arrival date & time 01/04/15  0139 History  This chart was scribed for David Schmidt, MD by Delphia Grates, ED Scribe. This patient was seen in room A09C/A09C and the patient's care was started at 3:01 AM.   Chief Complaint  Patient presents with  . Back Pain    The history is provided by the patient. No language interpreter was used.     HPI Comments: David [name] is a 59 y.o. male who presents to the Emergency Department for exacerbation of chronic lower back pain that began today. Patient reports heavy lifting prior to onset. He has not taken anything for pain relief, stating he has tried resting without relief. He denies fever, chills, numbness/weakness of the extremities, saddle anesthesia, or bowel/bladder incontinence.   Past Medical History  Diagnosis Date  . Arthritis   . Hypertension    Past Surgical History  Procedure Laterality Date  . Shoulder arthroscopy w/ rotator cuff repair  10/21/10    At [place], R shoulder   No family history on file. History  Substance Use Topics  . Smoking status: Never Smoker   . Smokeless tobacco: Never Used  . Alcohol Use: Yes     Comment: occasion    Review of Systems  A complete 10 system review of systems was obtained and all systems are negative except as noted in the HPI and PMH.    Allergies  Review of patient's allergies indicates no known allergies.  Home Medications   Prior to Admission medications   Medication Sig Start Date End Date Taking? Authorizing Provider  Aspirin (ADULT ASPIRIN LOW STRENGTH) 81 MG EC tablet Take 1 tablet (81 mg total) by mouth daily. 02/11/11  Yes Silverio Decamp, MD  diclofenac (VOLTAREN) 75 MG EC tablet TAKE 1 TABLET BY MOUTH TWICE A DAY 11/23/13  Yes Kinnie Feil, MD  tamsulosin (FLOMAX) 0.4 MG CAPS capsule Take 1 capsule (0.4 mg total) by mouth daily. 10/19/13  Yes Kinnie Feil, MD  benzonatate (TESSALON) 100 MG capsule Take 1 capsule (100 mg total) by mouth 2  (two) times daily as needed for cough. Patient not taking: Reported on 09/18/2014 06/07/14   Coral Spikes, DO  cyclobenzaprine (FLEXERIL) 10 MG tablet Take 1 tablet (10 mg total) by mouth 3 (three) times daily as needed for muscle spasms. Patient not taking: Reported on 01/04/2015 [id number]   Delora Fuel, MD  meclizine (ANTIVERT) 12.5 MG tablet Take 1 tablet (12.5 mg total) by mouth 3 (three) times daily as needed for dizziness. Patient not taking: Reported on 01/04/2015 11/21/13   Allen Norris, MD  methocarbamol (ROBAXIN-750) 750 MG tablet Take 1 tablet (750 mg total) by mouth 4 (four) times daily. Patient not taking: Reported on 01/04/2015 09/18/14   Lacretia Leigh, MD  oxyCODONE-acetaminophen (PERCOCET) 5-325 MG per tablet Take 1 tablet by mouth every 4 (four) hours as needed for moderate pain. Patient not taking: Reported on 01/04/2015 [id number]   Delora Fuel, MD  predniSONE (DELTASONE) 10 MG tablet Take 2 tablets (20 mg total) by mouth daily. Patient not taking: Reported on 01/04/2015 09/18/14   Lacretia Leigh, MD   Triage Vitals: BP 134/85 mmHg  Pulse 99  Resp 20  SpO2 96%  Physical Exam  Constitutional: He is oriented to person, place, and time. He appears well-developed and well-nourished.  HENT:  Head: Normocephalic and atraumatic.  Eyes: EOM are normal.  Neck: Normal range of motion.  Cardiovascular: Normal rate, regular  rhythm, normal heart sounds and intact distal pulses.   Pulmonary/Chest: Effort normal and breath sounds normal. No respiratory distress.  Abdominal: Soft. He exhibits no distension. There is no tenderness.  Musculoskeletal: Normal range of motion.  Mild lumbar and paralumbar tenderness.  Neurological: He is alert and oriented to person, place, and time.  Skin: Skin is warm and dry.  Psychiatric: He has a normal mood and affect. Judgment normal.  Nursing note and vitals reviewed.   ED Course  Procedures (including critical care time)  DIAGNOSTIC STUDIES: Oxygen  Saturation is 96% on room air, adequate by my interpretation.    COORDINATION OF CARE: At 0306 Discussed treatment plan with patient which includes pain medication, muscle relaxer, and f/u with family practice. Patient agrees.   Labs Review Labs Reviewed - No data to display  Imaging Review No results found.   EKG Interpretation   Date/Time:  Friday Jan 04 2015 01:51:50 EDT Ventricular Rate:  97 PR Interval:  137 QRS Duration: 66 QT Interval:  342 QTC Calculation: 434 R Axis:   77 Text Interpretation:  Sinus rhythm Probable left atrial enlargement No  significant change was found Confirmed by Stella Encarnacion  MD, Keshaun Dubey ([id number]) on  01/04/2015 3:32:47 AM      MDM   Final diagnoses:  None    Patient is overall well-appearing.  This appears to be in acute on chronic low back exacerbation.  Lower extremity strength is normal.  Discharge home in good condition.  Chest pain was transient and very nonspecific.  EKG is without ischemic changes.  No chest pain at this time.  Discharge home in good condition.  Home with anti-inflammatories and muscle relaxants.  I personally performed the services described in this documentation, which was scribed in my presence. The recorded information has been reviewed and is accurate.      David Schmidt, MD 01/04/15 [phone]

## 2015-01-04 NOTE — ED Notes (Signed)
Pt. reports low back pain onset today after lifting heavy dresser , ambulatory / denies urinary discomfort.

## 2015-01-04 NOTE — ED Notes (Signed)
Patients drug paraphernalia "crack pipe" fell onto the floor. When asked about it he said he didn't want it and that he didn't know where it came from. It was then collected and given to security for proper disposal.

## 2015-01-04 NOTE — ED Notes (Signed)
Dr Venora Maples in room

## 2015-01-04 NOTE — Discharge Instructions (Signed)

## 2015-01-04 NOTE — ED Notes (Signed)
In helping the patient to the bed, what looked to be a crack pipe fell out of one of the patient's garments and onto the floor. Patient stated that he did not want it and that he did not know where it came from.

## 2015-05-24 ENCOUNTER — Encounter: Payer: Medicare HMO | Admitting: Family Medicine

## 2015-06-06 ENCOUNTER — Encounter: Payer: Self-pay | Admitting: Family Medicine

## 2015-06-06 NOTE — Patient Instructions (Signed)
Patient has not had a recent colonoscopy.  I had discussed colon cancer screening with him in the past. I get a letter from [facility] recommending home self FOBT testing kit. This will be useful if patient will like to participate. I called patient to discuss the free colorectal cancer screening kit, but he is not currently interested in it. He stated he prefers to come to the clinic to get tested. Patient will schedule appointment for screening.   [place] [facility], please call patient to schedule appointment for colon cancer screening.

## 2015-07-16 ENCOUNTER — Ambulatory Visit (INDEPENDENT_AMBULATORY_CARE_PROVIDER_SITE_OTHER): Payer: Medicare HMO | Admitting: Family Medicine

## 2015-07-16 ENCOUNTER — Encounter: Payer: Self-pay | Admitting: Family Medicine

## 2015-07-16 VITALS — BP 145/101 | HR 78 | Temp 97.9°F | Ht 70.0 in | Wt 162.1 lb

## 2015-07-16 DIAGNOSIS — Z131 Encounter for screening for diabetes mellitus: Secondary | ICD-10-CM

## 2015-07-16 DIAGNOSIS — R030 Elevated blood-pressure reading, without diagnosis of hypertension: Secondary | ICD-10-CM

## 2015-07-16 DIAGNOSIS — IMO0001 Reserved for inherently not codable concepts without codable children: Secondary | ICD-10-CM

## 2015-07-16 DIAGNOSIS — Z0000 Encounter for general adult medical examination without abnormal findings: Secondary | ICD-10-CM

## 2015-07-16 DIAGNOSIS — N529 Male erectile dysfunction, unspecified: Secondary | ICD-10-CM | POA: Diagnosis not present

## 2015-07-16 DIAGNOSIS — Z1211 Encounter for screening for malignant neoplasm of colon: Secondary | ICD-10-CM

## 2015-07-16 DIAGNOSIS — N400 Enlarged prostate without lower urinary tract symptoms: Secondary | ICD-10-CM

## 2015-07-16 MED ORDER — TAMSULOSIN HCL 0.4 MG PO CAPS: 0.4000 mg | ORAL_CAPSULE | Freq: Every day | ORAL | Status: DC

## 2015-07-16 MED ORDER — DICLOFENAC SODIUM 75 MG PO TBEC: 75.0000 mg | DELAYED_RELEASE_TABLET | Freq: Two times a day (BID) | ORAL | Status: DC

## 2015-07-16 NOTE — Addendum Note (Signed)
Addended by: Andrena Mews T on: 07/16/2015 07:39 PM   Modules accepted: Orders

## 2015-07-16 NOTE — Progress Notes (Signed)
Patient ID: David CRUSAN, male   DOB: 1956-08-17, 59 y.o.   MRN: [id number] Subjective:     David [name] is a 59 y.o. male and is here for a comprehensive physical exam. The patient reports problems - slight sexual problem and medication refill. He also mentioned he has occasional left 1st toe pain.  His issue with ED started more than 1 year ago. He was going through stress 5 months ago when he was living in a shelter but feels better now. He is unable to initiate erection. He denies penile pain or lesion. He need refill of his diclofenac which he uses for left wrist pain.  Social History   Social History  . Marital Status: Single    Spouse Name: N/A  . Number of Children: N/A  . Years of Education: N/A   Occupational History  . Not on file.   Social History Main Topics  . Smoking status: Never Smoker   . Smokeless tobacco: Never Used  . Alcohol Use: Yes     Comment: occasion  . Drug Use: No  . Sexual Activity: Not on file   Other Topics Concern  . Not on file   Social History Narrative   Health Maintenance  Topic Date Due  . ZOSTAVAX  11/05/2014  . INFLUENZA VACCINE  04/01/2015  . TETANUS/TDAP  11/06/2018  . COLONOSCOPY  09/08/2021  . Hepatitis C Screening  Completed  . HIV Screening  Completed    The following portions of the patient's history were reviewed and updated as appropriate: allergies, current medications, past family history, past medical history, past social history, past surgical history and problem list.  Review of Systems Pertinent items are noted in HPI.   Objective:    BP 145/101 mmHg  Pulse 78  Temp(Src) 97.9 F (36.6 C) (Oral)  Ht 5\' 10"  (1.778 m)  Wt 162 lb 1 oz (73.511 kg)  BMI 23.25 kg/m2 General appearance: alert, cooperative and appears stated age Head: Normocephalic, without obvious abnormality, atraumatic Eyes: conjunctivae/corneas clear. PERRL, EOM's intact. Fundi benign. Ears: normal TM's and external ear canals both  ears Throat: lips, mucosa, and tongue normal; teeth and gums normal Neck: no adenopathy, no carotid bruit, no JVD, supple, symmetrical, trachea midline and thyroid not enlarged, symmetric, no tenderness/mass/nodules Lungs: clear to auscultation bilaterally Chest wall: no tenderness Heart: regular rate and rhythm, S1, S2 normal, no murmur, click, rub or gallop Abdomen: soft, non-tender; bowel sounds normal; no masses,  no organomegaly Extremities: extremities normal, atraumatic, no cyanosis or edema and Very mild tenderness of left 1st digit with deep palpation, otherwise ROM normal. Skin: Skin color, texture, turgor normal. No rashes or lesions Neurologic: Alert and oriented X 3, normal strength and tone. Normal symmetric reflexes. Normal coordination and gait    Assessment:    Healthy male exam. Normal.     Erectile dysfunction/BPH.  Left wist pain and foot pain. Elevated BP Plan:   Normal physical exam. Anticipatory guidance discussed. Flu shot offered but he stated he got it at [facility] recently. ( 06/30/15) Colonoscopy done in 2010 result and pathology report reviewed. Patient is uncertain when to return for follow up although he stated he was informed he should return 3 yrs after. I called his GI and they said he is due for repeat test. I referred him to GI for colonoscopy.   ED: ?? Hypoandrogenism.       He will return for early morning testosterone check and TSH.  Consider cialis which can also be used for BPH.  BPH: he need refill of Flomax. Refill done.  Pain: Wrist and foot. May be arthritis.          I refilled his diclofenac.          Plan to obtain xray if no improvement with meds.  Elevated BP: Not rechecked prior to him leaving.                       I plan to defer antihypertensive agent till after he restarts his Flomax which he has not used in months.                       Flomax can cause hypotension. If no improvement on Flomax then I  will consider starting antihypertensive agent.

## 2015-07-16 NOTE — Patient Instructions (Signed)
Benign Prostatic Hyperplasia An enlarged prostate (benign prostatic hyperplasia) is common in older men. You may experience the following:  Weak urine stream.  Dribbling.  Feeling like the bladder has not emptied completely.  Difficulty starting urination.  Getting up frequently at night to urinate.  Urinating more frequently during the day. HOME CARE INSTRUCTIONS  Monitor your prostatic hyperplasia for any changes. The following actions may help to alleviate any discomfort you are experiencing:  Give yourself time when you urinate.  Stay away from alcohol.  Avoid beverages containing caffeine, such as coffee, tea, and colas, because they can make the problem worse.  Avoid decongestants, antihistamines, and some prescription medicines that can make the problem worse.  Follow up with your health care provider for further treatment as recommended. SEEK MEDICAL CARE IF:  You are experiencing progressive difficulty voiding.  Your urine stream is progressively getting narrower.  You are awaking from sleep with the urge to void more frequently.  You are constantly feeling the need to void.  You experience loss of urine, especially in small amounts. SEEK IMMEDIATE MEDICAL CARE IF:   You develop increased pain with urination or are unable to urinate.  You develop severe abdominal pain, vomiting, a high fever, or fainting.  You develop back pain or blood in your urine. MAKE SURE YOU:   Understand these instructions.  Will watch your condition.  Will get help right away if you are not doing well or get worse.   This information is not intended to replace advice given to you by your health care provider. Make sure you discuss any questions you have with your health care provider.   Document Released: 08/17/2005 Document Revised: 09/07/2014 Document Reviewed: 01/17/2013 Elsevier Interactive Patient Education 2016 [employer]  

## 2015-07-17 NOTE — Addendum Note (Signed)
Addended by: Andrena Mews T on: 07/17/2015 08:51 AM   Modules accepted: Orders

## 2015-07-22 ENCOUNTER — Other Ambulatory Visit: Payer: Medicare HMO

## 2015-09-24 ENCOUNTER — Encounter: Payer: Self-pay | Admitting: Gastroenterology

## 2016-02-28 ENCOUNTER — Other Ambulatory Visit: Payer: Medicare HMO

## 2016-02-28 ENCOUNTER — Ambulatory Visit: Payer: Medicare HMO | Admitting: Family Medicine

## 2016-03-10 ENCOUNTER — Ambulatory Visit: Payer: Medicare HMO | Admitting: Family Medicine

## 2016-03-13 ENCOUNTER — Ambulatory Visit: Payer: Medicare HMO | Admitting: Family Medicine

## 2016-06-06 ENCOUNTER — Emergency Department (HOSPITAL_COMMUNITY)
Admission: EM | Admit: 2016-06-06 | Discharge: 2016-06-06 | Disposition: A | Discharge status: Home / Self Care | Payer: Medicare HMO | Attending: Emergency Medicine | Admitting: Emergency Medicine

## 2016-06-06 ENCOUNTER — Encounter (HOSPITAL_COMMUNITY): Payer: Self-pay | Admitting: *Deleted

## 2016-06-06 DIAGNOSIS — Y939 Activity, unspecified: Secondary | ICD-10-CM | POA: Insufficient documentation

## 2016-06-06 DIAGNOSIS — Y929 Unspecified place or not applicable: Secondary | ICD-10-CM | POA: Insufficient documentation

## 2016-06-06 DIAGNOSIS — S39012A Strain of muscle, fascia and tendon of lower back, initial encounter: Secondary | ICD-10-CM

## 2016-06-06 DIAGNOSIS — Y999 Unspecified external cause status: Secondary | ICD-10-CM | POA: Diagnosis not present

## 2016-06-06 DIAGNOSIS — X500XXA Overexertion from strenuous movement or load, initial encounter: Secondary | ICD-10-CM | POA: Insufficient documentation

## 2016-06-06 DIAGNOSIS — Z7982 Long term (current) use of aspirin: Secondary | ICD-10-CM | POA: Insufficient documentation

## 2016-06-06 DIAGNOSIS — I10 Essential (primary) hypertension: Secondary | ICD-10-CM | POA: Insufficient documentation

## 2016-06-06 DIAGNOSIS — S3992XA Unspecified injury of lower back, initial encounter: Secondary | ICD-10-CM | POA: Diagnosis present

## 2016-06-06 MED ORDER — IBUPROFEN 800 MG PO TABS
800.0000 mg | ORAL_TABLET | Freq: Once | ORAL | Status: AC
  Administered 2016-06-06: 800 mg via ORAL
  Filled 2016-06-06: qty 1

## 2016-06-06 MED ORDER — TRAMADOL HCL 50 MG PO TABS: 50.0000 mg | ORAL_TABLET | Freq: Four times a day (QID) | ORAL | 0 refills | Status: DC | PRN

## 2016-06-06 MED ORDER — CYCLOBENZAPRINE HCL 10 MG PO TABS: 10.0000 mg | ORAL_TABLET | Freq: Three times a day (TID) | ORAL | 0 refills | Status: DC | PRN

## 2016-06-06 MED ORDER — OXYCODONE-ACETAMINOPHEN 5-325 MG PO TABS
1.0000 | ORAL_TABLET | Freq: Once | ORAL | Status: AC
Start: 2016-06-06 — End: 2016-06-06
  Administered 2016-06-06: 1 via ORAL
  Filled 2016-06-06: qty 1

## 2016-06-06 NOTE — ED Provider Notes (Signed)
[facility] DEPT Provider Note   CSN: [id number] Arrival date & time: 06/06/16  0112     History   Chief Complaint Chief Complaint  Patient presents with  . Back Pain    HPI David [name] is a 60 y.o. male.  Patient presents to the emergency department for back injury. Patient reports a history of chronic back problems. He bent over to pick up a heavy object today and started having pain in his lower back. Pain is constant and worsens with movement. He denies any radiation to the legs. No numbness, tingling or weakness in lower extremities. No change in bowel or bladder habits.      Past Medical History:  Diagnosis Date  . Arthritis   . Benign prostatic hyperplasia   . Hypertension     Patient Active Problem List   Diagnosis Date Noted  . Labral tear of shoulder 01/25/2014  . Adhesive capsulitis of left shoulder 12/05/2013  . Seasonal allergies 12/27/2012  . Trigger point of neck 08/16/2012  . Hyperlipidemia 08/08/2012  . Elevated fasting glucose 08/08/2012  . Elevated LFTs 08/08/2012  . Cocaine abuse 08/08/2012  . Chronic pain 06/01/2012  . BPH (benign prostatic hyperplasia) 09/09/2011  . ROTATOR CUFF TEAR 12/03/2008    Past Surgical History:  Procedure Laterality Date  . SHOULDER ARTHROSCOPY W/ ROTATOR CUFF REPAIR  10/21/10   At [place], R shoulder       Home Medications    Prior to Admission medications   Medication Sig Start Date End Date Taking? Authorizing Provider  Aspirin (ADULT ASPIRIN LOW STRENGTH) 81 MG EC tablet Take 1 tablet (81 mg total) by mouth daily. 02/11/11   Silverio Decamp, MD  diclofenac (VOLTAREN) 75 MG EC tablet Take 1 tablet (75 mg total) by mouth 2 (two) times daily. 07/16/15   Kinnie Feil, MD  tamsulosin (FLOMAX) 0.4 MG CAPS capsule Take 1 capsule (0.4 mg total) by mouth daily. 07/16/15   Kinnie Feil, MD    Family History No family history on file.  Social History Social History  Substance Use Topics  .  Smoking status: Never Smoker  . Smokeless tobacco: Never Used  . Alcohol use Yes     Comment: occasion     Allergies   Review of patient's allergies indicates no known allergies.   Review of Systems Review of Systems  Musculoskeletal: Positive for back pain.  All other systems reviewed and are negative.    Physical Exam Updated Vital Signs BP (!) 157/108 (BP Location: Left Arm)   Pulse 77   Temp 97.8 F (36.6 C) (Oral)   Resp 18   SpO2 100%   Physical Exam  Constitutional: He is oriented to person, place, and time. He appears well-developed and well-nourished. No distress.  HENT:  Head: Normocephalic and atraumatic.  Right Ear: Hearing normal.  Left Ear: Hearing normal.  Nose: Nose normal.  Mouth/Throat: Oropharynx is clear and moist and mucous membranes are normal.  Eyes: Conjunctivae and EOM are normal. Pupils are equal, round, and reactive to light.  Neck: Normal range of motion. Neck supple.  Cardiovascular: Regular rhythm, S1 normal and S2 normal.  Exam reveals no gallop and no friction rub.   No murmur heard. Pulmonary/Chest: Effort normal and breath sounds normal. No respiratory distress. He exhibits no tenderness.  Abdominal: Soft. Normal appearance and bowel sounds are normal. There is no hepatosplenomegaly. There is no tenderness. There is no rebound, no guarding, no tenderness at McBurney's point and negative  Murphy's sign. No hernia.  Musculoskeletal: Normal range of motion.       Lumbar back: He exhibits tenderness and spasm.  Neurological: He is alert and oriented to person, place, and time. He has normal strength. No cranial nerve deficit or sensory deficit. Coordination normal. GCS eye subscore is 4. GCS verbal subscore is 5. GCS motor subscore is 6.  Skin: Skin is warm, dry and intact. No rash noted. No cyanosis.  Psychiatric: He has a normal mood and affect. His speech is normal and behavior is normal. Thought content normal.  Nursing note and vitals  reviewed.    ED Treatments / Results  Labs (all labs ordered are listed, but only abnormal results are displayed) Labs Reviewed - No data to display  EKG  EKG Interpretation None       Radiology No results found.  Procedures Procedures (including critical care time)  Medications Ordered in ED Medications  oxyCODONE-acetaminophen (PERCOCET/ROXICET) 5-325 MG per tablet 1 tablet (not administered)  ibuprofen (ADVIL,MOTRIN) tablet 800 mg (not administered)     Initial Impression / Assessment and Plan / ED Course  I have reviewed the triage vital signs and the nursing notes.  Pertinent labs & imaging results that were available during my care of the patient were reviewed by me and considered in my medical decision making (see chart for details).  Clinical Course    Patient presents to the ER with musculoskeletal back pain. Examination reveals back tenderness without any associated neurologic findings. Patient's strength, sensation and reflexes were normal. There is no evidence of saddle anesthesia. Patient does not have a foot drop. Patient has not experienced any change in bowel or bladder function. As such, patient did not require any imaging or further studies. Patient was treated with analgesia.  Final Clinical Impressions(s) / ED Diagnoses   Final diagnoses:  Strain of lumbar region, initial encounter    New Prescriptions New Prescriptions   No medications on file     Orpah Greek, MD 06/06/16 [phone]

## 2016-06-06 NOTE — ED Triage Notes (Signed)
Pt c/o low back pain after lifting a box today. Hx of back injury

## 2016-06-12 DIAGNOSIS — M25511 Pain in right shoulder: Secondary | ICD-10-CM | POA: Diagnosis not present

## 2016-06-12 DIAGNOSIS — M62838 Other muscle spasm: Secondary | ICD-10-CM | POA: Diagnosis not present

## 2016-06-12 DIAGNOSIS — G8929 Other chronic pain: Secondary | ICD-10-CM | POA: Diagnosis not present

## 2016-06-12 DIAGNOSIS — M545 Low back pain: Secondary | ICD-10-CM | POA: Insufficient documentation

## 2016-06-12 DIAGNOSIS — Y929 Unspecified place or not applicable: Secondary | ICD-10-CM | POA: Diagnosis not present

## 2016-06-12 DIAGNOSIS — Y939 Activity, unspecified: Secondary | ICD-10-CM | POA: Diagnosis not present

## 2016-06-12 DIAGNOSIS — Y990 Civilian activity done for income or pay: Secondary | ICD-10-CM | POA: Diagnosis not present

## 2016-06-12 DIAGNOSIS — X500XXA Overexertion from strenuous movement or load, initial encounter: Secondary | ICD-10-CM | POA: Insufficient documentation

## 2016-06-13 ENCOUNTER — Emergency Department (HOSPITAL_COMMUNITY)
Admission: EM | Admit: 2016-06-13 | Discharge: 2016-06-13 | Disposition: A | Discharge status: Home / Self Care | Payer: Medicare HMO | Attending: Emergency Medicine | Admitting: Emergency Medicine

## 2016-06-13 ENCOUNTER — Encounter (HOSPITAL_COMMUNITY): Payer: Self-pay | Admitting: *Deleted

## 2016-06-13 DIAGNOSIS — X500XXA Overexertion from strenuous movement or load, initial encounter: Secondary | ICD-10-CM | POA: Diagnosis not present

## 2016-06-13 DIAGNOSIS — G8929 Other chronic pain: Secondary | ICD-10-CM

## 2016-06-13 DIAGNOSIS — M62838 Other muscle spasm: Secondary | ICD-10-CM

## 2016-06-13 DIAGNOSIS — M25511 Pain in right shoulder: Secondary | ICD-10-CM | POA: Diagnosis not present

## 2016-06-13 DIAGNOSIS — M545 Low back pain: Secondary | ICD-10-CM | POA: Diagnosis not present

## 2016-06-13 MED ORDER — NAPROXEN 500 MG PO TABS: 500.0000 mg | ORAL_TABLET | Freq: Two times a day (BID) | ORAL | 0 refills | Status: DC

## 2016-06-13 MED ORDER — METHOCARBAMOL 500 MG PO TABS
500.0000 mg | ORAL_TABLET | Freq: Once | ORAL | Status: AC
  Administered 2016-06-13: 500 mg via ORAL
  Filled 2016-06-13: qty 1

## 2016-06-13 MED ORDER — IBUPROFEN 400 MG PO TABS
800.0000 mg | ORAL_TABLET | Freq: Once | ORAL | Status: AC
  Administered 2016-06-13: 800 mg via ORAL
  Filled 2016-06-13: qty 2

## 2016-06-13 MED ORDER — METHOCARBAMOL 500 MG PO TABS
500.0000 mg | ORAL_TABLET | Freq: Two times a day (BID) | ORAL | 0 refills | Status: DC
Start: 2016-06-13 — End: 2016-11-06

## 2016-06-13 NOTE — Discharge Instructions (Signed)
1. Medications: robaxin, naproxyn, usual home medications 2. Treatment: rest, drink plenty of fluids, gentle stretching as discussed, alternate ice and heat 3. Follow Up: Please followup with your primary doctor in 3 days for discussion of your diagnoses and further evaluation after today's visit; if you do not have a primary care doctor use the resource guide provided to find one;  Return to the ER for worsening back pain, difficulty walking, loss of bowel or bladder control or other concerning symptoms

## 2016-06-13 NOTE — ED Triage Notes (Signed)
Pt c/o right shoulder pain when picking up some trash. Pt denies any injury to shoulder.

## 2016-06-13 NOTE — ED Notes (Signed)
See providers assessment.  

## 2016-06-13 NOTE — ED Provider Notes (Signed)
[facility] DEPT Provider Note   CSN: [id number] Arrival date & time: 06/12/16  2345     History   Chief Complaint Chief Complaint  Patient presents with  . Shoulder Pain    HPI KEYEN MARBAN is a 60 y.o. male with a hx of arthritis, HTN, chronic back pain presents to the Emergency Department complaining of gradual, persistent, progressively worsening Right shoulder pain onset 11 AM after picking up a heavy tray while at work last night. Patient reports that pain is cramping in nature. No treatments prior to arrival. Patient also reports associated low back pain. This is chronic in nature. He reports it is totally unchanged from previous episodes of back pain. No numbness, tingling, weakness, loss of bowel or bladder control. Patient does not take blood thinners, denies trauma, has no history of cancer and denies IV drug use. Movement and palpation makes the symptoms worse. Nothing makes it better.   The history is provided by the patient and medical records. No language interpreter was used.    Past Medical History:  Diagnosis Date  . Arthritis   . Benign prostatic hyperplasia   . Hypertension     Patient Active Problem List   Diagnosis Date Noted  . Labral tear of shoulder 01/25/2014  . Adhesive capsulitis of left shoulder 12/05/2013  . Seasonal allergies 12/27/2012  . Trigger point of neck 08/16/2012  . Hyperlipidemia 08/08/2012  . Elevated fasting glucose 08/08/2012  . Elevated LFTs 08/08/2012  . Cocaine abuse 08/08/2012  . Chronic pain 06/01/2012  . BPH (benign prostatic hyperplasia) 09/09/2011  . ROTATOR CUFF TEAR 12/03/2008    Past Surgical History:  Procedure Laterality Date  . SHOULDER ARTHROSCOPY W/ ROTATOR CUFF REPAIR  10/21/10   At [place], R shoulder       Home Medications    Prior to Admission medications   Medication Sig Start Date End Date Taking? Authorizing Provider  Aspirin (ADULT ASPIRIN LOW STRENGTH) 81 MG EC tablet Take 1 tablet (81 mg  total) by mouth daily. 02/11/11   Silverio Decamp, MD  cyclobenzaprine (FLEXERIL) 10 MG tablet Take 1 tablet (10 mg total) by mouth 3 (three) times daily as needed for muscle spasms. 06/06/16   Orpah Greek, MD  diclofenac (VOLTAREN) 75 MG EC tablet Take 1 tablet (75 mg total) by mouth 2 (two) times daily. 07/16/15   Kinnie Feil, MD  methocarbamol (ROBAXIN) 500 MG tablet Take 1 tablet (500 mg total) by mouth 2 (two) times daily. 06/13/16   Tammra Pressman, PA-C  naproxen (NAPROSYN) 500 MG tablet Take 1 tablet (500 mg total) by mouth 2 (two) times daily with a meal. 06/13/16   Dalena Plantz, PA-C  tamsulosin (FLOMAX) 0.4 MG CAPS capsule Take 1 capsule (0.4 mg total) by mouth daily. 07/16/15   Kinnie Feil, MD  traMADol (ULTRAM) 50 MG tablet Take 1 tablet (50 mg total) by mouth every 6 (six) hours as needed. 06/06/16   Orpah Greek, MD    Family History History reviewed. No pertinent family history.  Social History Social History  Substance Use Topics  . Smoking status: Never Smoker  . Smokeless tobacco: Never Used  . Alcohol use Yes     Comment: occasion     Allergies   Review of patient's allergies indicates no known allergies.   Review of Systems Review of Systems  Constitutional: Negative for chills and fever.  Gastrointestinal: Negative for nausea and vomiting.  Musculoskeletal: Positive for arthralgias and back pain (  Low, chronic). Negative for joint swelling, neck pain and neck stiffness.  Skin: Negative for wound.  Neurological: Negative for numbness.  Hematological: Does not bruise/bleed easily.  Psychiatric/Behavioral: The patient is not nervous/anxious.   All other systems reviewed and are negative.    Physical Exam Updated Vital Signs BP (!) 155/101 (BP Location: Right Arm)   Pulse 77   Temp 97.7 F (36.5 C) (Oral)   Resp 17   Ht 5\' 10"  (1.778 m)   Wt 68.2 kg   SpO2 99%   BMI 21.57 kg/m   Physical Exam    Constitutional: He appears well-developed and well-nourished. No distress.  HENT:  Head: Normocephalic and atraumatic.  Mouth/Throat: Oropharynx is clear and moist. No oropharyngeal exudate.  Eyes: Conjunctivae are normal.  Neck: Normal range of motion. Neck supple.  Full ROM without pain Tenderness to palpation of the right trapezius with palpable muscle spasm  Cardiovascular: Normal rate, regular rhythm and intact distal pulses.   Pulmonary/Chest: Effort normal and breath sounds normal. No respiratory distress. He has no wheezes.  Abdominal: Soft. He exhibits no distension. There is no tenderness.  Musculoskeletal:  Full range of motion of the T-spine and L-spine No midline tenderness to the  T-spine or L-spine Tenderness to palpation of the bilateral paraspinous muscles of the L-spine Right shoulder: Full range of motion, no joint line tenderness, no swelling or erythema, no deformity or laceration.  Lymphadenopathy:    He has no cervical adenopathy.  Neurological: He is alert. He has normal reflexes.  Reflex Scores:      Bicep reflexes are 2+ on the right side and 2+ on the left side.      Brachioradialis reflexes are 2+ on the right side and 2+ on the left side.      Patellar reflexes are 2+ on the right side and 2+ on the left side.      Achilles reflexes are 2+ on the right side and 2+ on the left side. Speech is clear and goal oriented, follows commands Normal 5/5 strength in upper and lower extremities bilaterally including dorsiflexion and plantar flexion, strong and equal grip strength Sensation normal to light and sharp touch Moves extremities without ataxia, coordination intact Normal gait Normal balance No Clonus   Skin: Skin is warm and dry. No rash noted. He is not diaphoretic. No erythema.  Psychiatric: He has a normal mood and affect. His behavior is normal.  Nursing note and vitals reviewed.    ED Treatments / Results   Procedures Procedures (including  critical care time)  Medications Ordered in ED Medications  ibuprofen (ADVIL,MOTRIN) tablet 800 mg (800 mg Oral Given 06/13/16 0045)  methocarbamol (ROBAXIN) tablet 500 mg (500 mg Oral Given 06/13/16 0045)     Initial Impression / Assessment and Plan / ED Course  I have reviewed the triage vital signs and the nursing notes.  Pertinent labs & imaging results that were available during my care of the patient were reviewed by me and considered in my medical decision making (see chart for details).  Clinical Course    Patient with chronic back pain, unchanged tonight.  Right shoulder with range of motion. No evidence of septic joint.  No trauma. No indication for x-ray. No neurological deficits and normal neuro exam.  Patient can walk but states is painful.  No loss of bowel or bladder control.  No concern for cauda equina.  No fever, night sweats, weight loss, h/o cancer, IVDU.  RICE protocol and pain  medicine indicated and discussed with patient.    Final Clinical Impressions(s) / ED Diagnoses   Final diagnoses:  Chronic bilateral low back pain without sciatica  Trapezius muscle spasm    New Prescriptions New Prescriptions   METHOCARBAMOL (ROBAXIN) 500 MG TABLET    Take 1 tablet (500 mg total) by mouth 2 (two) times daily.   NAPROXEN (NAPROSYN) 500 MG TABLET    Take 1 tablet (500 mg total) by mouth 2 (two) times daily with a meal.     Abigail Butts, PA-C 06/13/16 0052    April Palumbo, MD 06/13/16 0401

## 2016-06-13 NOTE — ED Notes (Signed)
Pt stable, understands discharge instructions, and reasons for return.   

## 2016-06-25 DIAGNOSIS — R69 Illness, unspecified: Secondary | ICD-10-CM | POA: Diagnosis not present

## 2016-09-19 ENCOUNTER — Other Ambulatory Visit: Payer: Self-pay | Admitting: Family Medicine

## 2016-09-24 DIAGNOSIS — H52223 Regular astigmatism, bilateral: Secondary | ICD-10-CM | POA: Diagnosis not present

## 2016-10-18 ENCOUNTER — Encounter (HOSPITAL_COMMUNITY): Payer: Self-pay

## 2016-10-18 ENCOUNTER — Emergency Department (HOSPITAL_COMMUNITY)
Admission: EM | Admit: 2016-10-18 | Discharge: 2016-10-18 | Disposition: A | Discharge status: Home / Self Care | Payer: Medicare HMO | Attending: Emergency Medicine | Admitting: Emergency Medicine

## 2016-10-18 DIAGNOSIS — I10 Essential (primary) hypertension: Secondary | ICD-10-CM | POA: Insufficient documentation

## 2016-10-18 DIAGNOSIS — Z79899 Other long term (current) drug therapy: Secondary | ICD-10-CM | POA: Diagnosis not present

## 2016-10-18 DIAGNOSIS — Z7982 Long term (current) use of aspirin: Secondary | ICD-10-CM | POA: Insufficient documentation

## 2016-10-18 DIAGNOSIS — L02412 Cutaneous abscess of left axilla: Secondary | ICD-10-CM | POA: Insufficient documentation

## 2016-10-18 MED ORDER — CEPHALEXIN 500 MG PO CAPS: 500.0000 mg | ORAL_CAPSULE | Freq: Four times a day (QID) | ORAL | 0 refills | Status: DC

## 2016-10-18 MED ORDER — LIDOCAINE HCL (PF) 1 % IJ SOLN
5.0000 mL | Freq: Once | INTRAMUSCULAR | Status: AC
  Administered 2016-10-18: 5 mL
  Filled 2016-10-18: qty 5

## 2016-10-18 MED ORDER — TETANUS-DIPHTH-ACELL PERTUSSIS 5-2.5-18.5 LF-MCG/0.5 IM SUSP
0.5000 mL | Freq: Once | INTRAMUSCULAR | Status: AC
  Administered 2016-10-18: 0.5 mL via INTRAMUSCULAR
  Filled 2016-10-18: qty 0.5

## 2016-10-18 MED ORDER — SULFAMETHOXAZOLE-TRIMETHOPRIM 800-160 MG PO TABS: 1.0000 | ORAL_TABLET | Freq: Two times a day (BID) | ORAL | 0 refills | Status: AC

## 2016-10-18 NOTE — ED Notes (Signed)
I&D tray at bedside.

## 2016-10-18 NOTE — ED Triage Notes (Signed)
Patient complains of 2 weeks of pain to left axilla related to abscesses. No drainage, only complains of discomfort

## 2016-10-18 NOTE — ED Provider Notes (Signed)
[facility] DEPT Provider Note    By signing my name below, I, Bea Graff, attest that this documentation has been prepared under the direction and in the presence of [facility], [place]. Electronically Signed: Bea Graff, ED Scribe. 10/18/16. 4:41 PM.    History   Chief Complaint Chief Complaint  Patient presents with  . Abscess   The history is provided by the patient and medical records. No language interpreter was used.    BRENT TAILLON is a 61 y.o. male who presents to the Emergency Department complaining of an abscess to the left axilla that appeared two weeks ago. He reports associated moderate soreness and a small amount of purulent drainage. He has not taken anything for pain but reports applying warm compresses with minimal relief. Touching the area increases the pain. He denies alleviating factors. He denies fever, chills, nausea, vomiting, warmth or red streaking.   Past Medical History:  Diagnosis Date  . Arthritis   . Benign prostatic hyperplasia   . Hypertension     Patient Active Problem List   Diagnosis Date Noted  . Labral tear of shoulder 01/25/2014  . Adhesive capsulitis of left shoulder 12/05/2013  . Seasonal allergies 12/27/2012  . Trigger point of neck 08/16/2012  . Hyperlipidemia 08/08/2012  . Elevated fasting glucose 08/08/2012  . Elevated LFTs 08/08/2012  . Cocaine abuse 08/08/2012  . Chronic pain 06/01/2012  . BPH (benign prostatic hyperplasia) 09/09/2011  . ROTATOR CUFF TEAR 12/03/2008    Past Surgical History:  Procedure Laterality Date  . SHOULDER ARTHROSCOPY W/ ROTATOR CUFF REPAIR  10/21/10   At [facility], R shoulder       Home Medications    Prior to Admission medications   Medication Sig Start Date End Date Taking? Authorizing Provider  Aspirin (ADULT ASPIRIN LOW STRENGTH) 81 MG EC tablet Take 1 tablet (81 mg total) by mouth daily. 02/11/11   Silverio Decamp, MD  cephALEXin (KEFLEX) 500 MG capsule Take 1 capsule (500  mg total) by mouth 4 (four) times daily. 10/18/16   [place], NP  cyclobenzaprine (FLEXERIL) 10 MG tablet Take 1 tablet (10 mg total) by mouth 3 (three) times daily as needed for muscle spasms. 06/06/16   Orpah Greek, MD  diclofenac (VOLTAREN) 75 MG EC tablet Take 1 tablet (75 mg total) by mouth 2 (two) times daily. 07/16/15   Kinnie Feil, MD  methocarbamol (ROBAXIN) 500 MG tablet Take 1 tablet (500 mg total) by mouth 2 (two) times daily. 06/13/16   Hannah Muthersbaugh, PA-C  naproxen (NAPROSYN) 500 MG tablet Take 1 tablet (500 mg total) by mouth 2 (two) times daily with a meal. 06/13/16   Hannah Muthersbaugh, PA-C  sulfamethoxazole-trimethoprim (BACTRIM DS,SEPTRA DS) 800-160 MG tablet Take 1 tablet by mouth 2 (two) times daily. 10/18/16 10/25/16  Zyshonne Malecha Bunnie Pion, NP  tamsulosin (FLOMAX) 0.4 MG CAPS capsule Take 1 capsule (0.4 mg total) by mouth daily. 07/16/15   Kinnie Feil, MD  traMADol (ULTRAM) 50 MG tablet Take 1 tablet (50 mg total) by mouth every 6 (six) hours as needed. 06/06/16   Orpah Greek, MD    Family History No family history on file.  Social History Social History  Substance Use Topics  . Smoking status: Never Smoker  . Smokeless tobacco: Never Used  . Alcohol use Yes     Comment: occasion     Allergies   Patient has no known allergies.   Review of Systems Review of Systems  Constitutional: Negative for chills and fever.  Gastrointestinal: Negative for nausea and vomiting.  Skin: Positive for color change (abscesses to left axilla).     Physical Exam Updated Vital Signs BP 124/79   Pulse 76   Temp 97.8 F (36.6 C) (Oral)   Resp 18   SpO2 100%   Physical Exam  Constitutional: He is oriented to person, place, and time. He appears well-developed and well-nourished.  HENT:  Head: Normocephalic.  Eyes: EOM are normal.  Neck: Neck supple.  Cardiovascular: Normal rate.   Pulmonary/Chest: Effort normal.  Musculoskeletal: Normal range  of motion.  Neurological: He is alert and oriented to person, place, and time. No cranial nerve deficit.  Skin: Skin is warm and dry.  Multiple raised tender areas to left axilla. Largest is 1 cm and has small amount of purulent drainage.  Psychiatric: He has a normal mood and affect.  Nursing note and vitals reviewed.    ED Treatments / Results  DIAGNOSTIC STUDIES: Oxygen Saturation is 100% on RA, normal by my interpretation.   COORDINATION OF CARE: 4:09 PM- Will drain largest abscess. Pt verbalizes understanding and agrees to plan.  INCISION AND DRAINAGE PROCEDURE NOTE: Patient identification was confirmed and verbal consent was obtained. This procedure was performed by Debroah Baller at 4:29 PM. Site: left axilla Sterile procedures observed Needle size: 25 G Anesthetic used (type and amt): Lidocaine 1% without Epinephrine (3 mLs) Blade size: 11 Drainage: small amount of bloody purulent   Complexity: Complex Packing used: n/a Site anesthetized, incision made over site, wound drained and explored loculations, rinsed with copious amounts of normal saline, covered with dry, sterile dressing. Pt tolerated procedure well without complications. Instructions for care discussed verbally and pt provided with additional written instructions for homecare and f/u.   Medications  lidocaine (PF) (XYLOCAINE) 1 % injection 5 mL (5 mLs Infiltration Given 10/18/16 1624)  Tdap (BOOSTRIX) injection 0.5 mL (0.5 mLs Intramuscular Given 10/18/16 1655)    Radiology No results found.  Procedures Procedures (including critical care time)  Medications Ordered in ED Medications  lidocaine (PF) (XYLOCAINE) 1 % injection 5 mL (5 mLs Infiltration Given 10/18/16 1624)  Tdap (BOOSTRIX) injection 0.5 mL (0.5 mLs Intramuscular Given 10/18/16 1655)     Initial Impression / Assessment and Plan / ED Course  I have reviewed the triage vital signs and the nursing notes.  Patient with multiple skin small  abscesses to left axilla. Incision and drainage performed in the ED today to largest abscess. Abscess was not large enough to warrant packing or drain placement. Wound recheck in 2 days. Supportive care and return precautions discussed.  Pt sent home with Keflex and Bactrim. The patient appears reasonably screened and/or stabilized for discharge and I doubt any other emergent medical condition requiring further screening, evaluation, or treatment in the ED prior to discharge.  I personally performed the services described in this documentation, which was scribed in my presence. The recorded information has been reviewed and is accurate.   Final Clinical Impressions(s) / ED Diagnoses   Final diagnoses:  Abscess of left axilla    New Prescriptions New Prescriptions   CEPHALEXIN (KEFLEX) 500 MG CAPSULE    Take 1 capsule (500 mg total) by mouth 4 (four) times daily.   SULFAMETHOXAZOLE-TRIMETHOPRIM (BACTRIM DS,SEPTRA DS) 800-160 MG TABLET    Take 1 tablet by mouth 2 (two) times daily.     [id number] [place], [place] 10/18/16 2130    Pattricia Boss, MD 10/18/16 2052

## 2016-10-18 NOTE — Discharge Instructions (Signed)
Apply warm wet compresses the area and take the medication as directed. Follow up in 2 days for recheck.

## 2016-10-30 ENCOUNTER — Other Ambulatory Visit: Payer: Self-pay | Admitting: Family Medicine

## 2016-11-06 ENCOUNTER — Encounter: Payer: Self-pay | Admitting: Family Medicine

## 2016-11-06 ENCOUNTER — Telehealth: Payer: Self-pay

## 2016-11-06 ENCOUNTER — Ambulatory Visit (INDEPENDENT_AMBULATORY_CARE_PROVIDER_SITE_OTHER): Payer: Medicare HMO | Admitting: Family Medicine

## 2016-11-06 VITALS — BP 110/78 | HR 74 | Temp 98.0°F | Wt 162.0 lb

## 2016-11-06 DIAGNOSIS — M25552 Pain in left hip: Secondary | ICD-10-CM | POA: Diagnosis not present

## 2016-11-06 DIAGNOSIS — R945 Abnormal results of liver function studies: Secondary | ICD-10-CM

## 2016-11-06 DIAGNOSIS — R7303 Prediabetes: Secondary | ICD-10-CM

## 2016-11-06 DIAGNOSIS — R7301 Impaired fasting glucose: Secondary | ICD-10-CM

## 2016-11-06 DIAGNOSIS — N401 Enlarged prostate with lower urinary tract symptoms: Secondary | ICD-10-CM

## 2016-11-06 DIAGNOSIS — R7989 Other specified abnormal findings of blood chemistry: Secondary | ICD-10-CM | POA: Diagnosis not present

## 2016-11-06 DIAGNOSIS — E785 Hyperlipidemia, unspecified: Secondary | ICD-10-CM | POA: Diagnosis not present

## 2016-11-06 LAB — COMPREHENSIVE METABOLIC PANEL
ALBUMIN: 4 g/dL (ref 3.6–5.1)
ALT: 21 U/L (ref 9–46)
AST: 27 U/L (ref 10–35)
Alkaline Phosphatase: 94 U/L (ref 40–115)
BILIRUBIN TOTAL: 0.2 mg/dL (ref 0.2–1.2)
BUN: 19 mg/dL (ref 7–25)
CO2: 27 mmol/L (ref 20–31)
CREATININE: 1.22 mg/dL (ref 0.70–1.25)
Calcium: 9 mg/dL (ref 8.6–10.3)
Chloride: 104 mmol/L (ref 98–110)
Glucose, Bld: 90 mg/dL (ref 65–99)
Potassium: 4.5 mmol/L (ref 3.5–5.3)
Sodium: 139 mmol/L (ref 135–146)
TOTAL PROTEIN: 7.6 g/dL (ref 6.1–8.1)

## 2016-11-06 LAB — LIPID PANEL
CHOL/HDL RATIO: 4 ratio (ref <5.0)
Cholesterol: 189 mg/dL (ref <200)
HDL: 47 mg/dL (ref >40)
LDL Cholesterol: 103 mg/dL — ABNORMAL HIGH (ref <100)
Triglycerides: 193 mg/dL — ABNORMAL HIGH (ref <150)
VLDL: 39 mg/dL — ABNORMAL HIGH (ref <30)

## 2016-11-06 LAB — POCT GLYCOSYLATED HEMOGLOBIN (HGB A1C): Hemoglobin A1C: 6.2

## 2016-11-06 MED ORDER — DICLOFENAC SODIUM 75 MG PO TBEC: 75.0000 mg | DELAYED_RELEASE_TABLET | Freq: Two times a day (BID) | ORAL | 3 refills | Status: DC | PRN

## 2016-11-06 MED ORDER — TAMSULOSIN HCL 0.4 MG PO CAPS: 0.4000 mg | ORAL_CAPSULE | Freq: Every day | ORAL | 2 refills | Status: DC

## 2016-11-06 NOTE — Assessment & Plan Note (Signed)
On diet control. FLP checked today. I will contact him with result.

## 2016-11-06 NOTE — Assessment & Plan Note (Signed)
Chronic left hip pain. Etiology unclear. Xray ordered to further evaluate. Diclofenac refilled prn pain. Home physical hip exercise discussed. F/U as needed. I will call him with xray result.

## 2016-11-06 NOTE — Progress Notes (Signed)
Subjective:     Patient ID: David [name], male   DOB: [birth year], 61 y.o.   MRN: [id number]  Hip Pain   Incident onset: C/O left hip pain x 1 yr on and off. There was no injury mechanism. The pain is present in the left hip. Quality: Sharp pain with walking or prolonged sitting. Pain scale: Pain is currently 0/10 in severity but can be as high as 9/10 in severity. The pain is moderate. The pain has been fluctuating since onset. Pertinent negatives include no inability to bear weight, loss of motion, loss of sensation, numbness or tingling. The symptoms are aggravated by movement and weight bearing. He has tried nothing for the symptoms.  BPH: When he does not use his Flomax stream of urine is slow with dribbling. He will like to get refill of his Flomax,no blood in his urine, no change in urine color. No pain with urination. Pre-DM2: FHx of DM in his maternal grandmother. He denies increase thirst, no weight loss. HLD:/Transaminases: Here for follow up.  Current Outpatient Prescriptions on File Prior to Visit  Medication Sig Dispense Refill  . tamsulosin (FLOMAX) 0.4 MG CAPS capsule TAKE 1 CAPSULE (0.4 MG TOTAL) BY MOUTH DAILY. 15 capsule 0  . Aspirin (ADULT ASPIRIN LOW STRENGTH) 81 MG EC tablet Take 1 tablet (81 mg total) by mouth daily. (Patient not taking: Reported on 11/06/2016) 90 tablet 3  . traMADol (ULTRAM) 50 MG tablet Take 1 tablet (50 mg total) by mouth every 6 (six) hours as needed. (Patient not taking: Reported on 11/06/2016) 15 tablet 0   No current facility-administered medications on file prior to visit.    Past Medical History:  Diagnosis Date  . Arthritis   . Benign prostatic hyperplasia   . Hypertension        Review of Systems  Constitutional: Negative.   Respiratory: Negative.   Cardiovascular: Negative.   Gastrointestinal: Negative.   Genitourinary: Negative for decreased urine volume, dysuria, enuresis, flank pain, frequency, penile pain and urgency.   Musculoskeletal: Positive for arthralgias.       Left hip pain  Neurological: Negative.  Negative for tingling and numbness.  All other systems reviewed and are negative.      Vitals:   11/06/16 0938  BP: 110/78  Pulse: 74  Temp: 98 F (36.7 C)  TempSrc: Oral  SpO2: 97%  Weight: 162 lb (73.5 kg)    Objective:   Physical Exam  Constitutional: He is oriented to person, place, and time. He appears well-developed. No distress.  Cardiovascular: Normal rate, regular rhythm and normal heart sounds.   No murmur heard. Pulmonary/Chest: Effort normal and breath sounds normal. No respiratory distress. He has no wheezes.  Abdominal: Soft. Bowel sounds are normal. He exhibits no distension and no mass. There is no tenderness.  Genitourinary:  Genitourinary Comments: Rectal exam deferred  Musculoskeletal: Normal range of motion. He exhibits no edema.       Right hip: Normal.       Left hip: Normal.  Neurological: He is alert and oriented to person, place, and time. He has normal reflexes. He displays normal reflexes. No cranial nerve deficit.  Psychiatric: He has a normal mood and affect.  Nursing note and vitals reviewed.      Assessment:     Left Hip pain BPH Pre-DM Hyperlipidemia Transaminases.    Plan:     Check problem list.

## 2016-11-06 NOTE — Patient Instructions (Signed)
Hip Exercises Ask your health care provider which exercises are safe for you. Do exercises exactly as told by your health care provider and adjust them as directed. It is normal to feel mild stretching, pulling, tightness, or discomfort as you do these exercises, but you should stop right away if you feel sudden pain or your pain gets worse.Do not begin these exercises until told by your health care provider. STRETCHING AND RANGE OF MOTION EXERCISES  These exercises warm up your muscles and joints and improve the movement and flexibility of your hip. These exercises also help to relieve pain, numbness, and tingling. Exercise A: Hamstrings, Supine   1. Lie on your back. 2. Loop a belt or towel over the ball of your left / rightfoot. The ball of your foot is on the walking surface, right under your toes. 3. Straighten your left / rightknee and slowly pull on the belt to raise your leg.  Do not let your left / right knee bend while you do this.  Keep your other leg flat on the floor.  Raise the left / right leg until you feel a gentle stretch behind your left / right knee or thigh. 4. Hold this position for __________ seconds. 5. Slowly return your leg to the starting position. Repeat __________ times. Complete this stretch __________ times a day. Exercise B: Hip Rotators   1. Lie on your back on a firm surface. 2. Hold your left / right knee with your left / right hand. Hold your ankle with your other hand. 3. Gently pull your left / right knee and rotate your lower leg toward your other shoulder.  Pull until you feel a stretch in your buttocks.  Keep your hips and shoulders firmly planted while you do this stretch. 4. Hold this position for __________ seconds. Repeat __________ times. Complete this stretch __________ times a day. Exercise C: V-Sit (Hamstrings and Adductors)   1. Sit on the floor with your legs extended in a large "V" shape. Keep your knees straight during this  exercise. 2. Start with your head and chest upright, then bend at your waist to reach for your left foot (position A). You should feel a stretch in your right inner thigh. 3. Hold this position for __________ seconds. Then slowly return to the upright position. 4. Bend at your waist to reach forward (position B). You should feel a stretch behind both of your thighs and knees. 5. Hold this position for __________ seconds. Then slowly return to the upright position. 6. Bend at your waist to reach for your right foot (position C). You should feel a stretch in your left inner thigh. 7. Hold this position for __________ seconds. Then slowly return to the upright position. Repeat __________ times. Complete this stretch __________ times a day. Exercise D: Lunge (Hip Flexors)   1. Place your left / right knee on the floor and bend your other knee so that is directly over your ankle. You should be half-kneeling. 2. Keep good posture with your head over your shoulders. 3. Tighten your buttocks to point your tailbone downward. This helps your back to keep from arching too much. 4. You should feel a gentle stretch in the front of your left / right thigh and hip. If you do not feel any resistance, slightly slide your other foot forward and then slowly lunge forward so your knee once again lines up over your ankle. 5. Make sure your tailbone continues to point downward. 6. Hold this  position for __________ seconds. Repeat __________ times. Complete this stretch __________ times a day. STRENGTHENING EXERCISES  These exercises build strength and endurance in your hip. Endurance is the ability to use your muscles for a long time, even after they get tired. Exercise E: Bridge (Hip Extensors)   1. Lie on your back on a firm surface with your knees bent and your feet flat on the floor. 2. Tighten your buttocks muscles and lift your bottom off the floor until the trunk of your body is level with your thighs.  Do  not arch your back.  You should feel the muscles working in your buttocks and the back of your thighs. If you do not feel these muscles, slide your feet 1-2 inches (2.5-5 cm) farther away from your buttocks. 3. Hold this position for __________ seconds. 4. Slowly lower your hips to the starting position. 5. Let your muscles relax completely between repetitions. 6. If this exercise is too easy, try doing it with your arms crossed over your chest. Repeat __________ times. Complete this exercise __________ times a day. Exercise F: Straight Leg Raises - Hip Abductors   1. Lie on your side with your left / right leg in the top position. Lie so your head, shoulder, knee, and hip line up with each other. You may bend your bottom knee to help you balance. 2. Roll your hips slightly forward, so your hips are stacked directly over each other and your left / right knee is facing forward. 3. Leading with your heel, lift your top leg 4-6 inches (10-15 cm). You should feel the muscles in your outer hip lifting.  Do not let your foot drift forward.  Do not let your knee roll toward the ceiling. 4. Hold this position for __________ seconds. 5. Slowly return to the starting position. 6. Let your muscles relax completely between repetitions. Repeat __________ times. Complete this exercise __________ times a day. Exercise G: Straight Leg Raises - Hip Adductors   1. Lie on your side with your left / right leg in the bottom position. Lie so your head, shoulder, knee, and hip line up. You may place your upper foot in front to help you balance. 2. Roll your hips slightly forward, so your hips are stacked directly over each other and your left / right knee is facing forward. 3. Tense the muscles in your inner thigh and lift your bottom leg 4-6 inches (10-15 cm). 4. Hold this position for __________ seconds. 5. Slowly return to the starting position. 6. Let your muscles relax completely between  repetitions. Repeat __________ times. Complete this exercise __________ times a day. Exercise H: Straight Leg Raises - Quadriceps   1. Lie on your back with your left / right leg extended and your other knee bent. 2. Tense the muscles in the front of your left / right thigh. When you do this, you should see your kneecap slide up or see increased dimpling just above your knee. 3. Tighten these muscles even more and raise your leg 4-6 inches (10-15 cm) off the floor. 4. Hold this position for __________ seconds. 5. Keep these muscles tense as you lower your leg. 6. Relax the muscles slowly and completely between repetitions. Repeat __________ times. Complete this exercise __________ times a day. Exercise I: Hip Abductors, Standing  1. Tie one end of a rubber exercise band or tubing to a secure surface, such as a table or pole. 2. Loop the other end of the band or tubing  around your left / right ankle. 3. Keeping your ankle with the band or tubing directly opposite of the secured end, step away until there is tension in the tubing or band. Hold onto a chair as needed for balance. 4. Lift your left / right leg out to your side. While you do this:  Keep your back upright.  Keep your shoulders over your hips.  Keep your toes pointing forward.  Make sure to use your hip muscles to lift your leg. Do not "throw" your leg or tip your body to lift your leg. 5. Hold this position for __________ seconds. 6. Slowly return to the starting position. Repeat __________ times. Complete this exercise __________ times a day. Exercise J: Squats (Quadriceps)  1. Stand in a door frame so your feet and knees are in line with the frame. You may place your hands on the frame for balance. 2. Slowly bend your knees and lower your hips like you are going to sit in a chair.  Keep your lower legs in a straight-up-and-down position.  Do not let your hips go lower than your knees.  Do not bend your knees lower than  told by your health care provider.  If your hip pain increases, do not bend as low. 3. Hold this position for ___________ seconds. 4. Slowly push with your legs to return to standing. Do not use your hands to pull yourself to standing. Repeat __________ times. Complete this exercise __________ times a day. This information is not intended to replace advice given to you by your health care provider. Make sure you discuss any questions you have with your health care provider. Document Released: 09/04/2005 Document Revised: 05/11/2016 Document Reviewed: 08/12/2015 Elsevier Interactive Patient Education  2017 [employer].

## 2016-11-06 NOTE — Assessment & Plan Note (Signed)
Cmet ordered today. Hep C vaccination recommended. Unfortunately we did not give him today before he left. We will contact him to schedule nurse visit for Hep B vaccine. He confirmed he has not had it.

## 2016-11-06 NOTE — Assessment & Plan Note (Signed)
Stable on Flomax. Prostate cancer screening recommended given age. PSA ordered today. I will contact him with result. Rectal exam not performed given PSA testing today. I refilled his Flomax. F/U as needed.

## 2016-11-06 NOTE — Assessment & Plan Note (Signed)
Asymptomatic. A1C of 6.2 today. Diet and exercise counseling done. Recheck A1C in 6 months.

## 2016-11-06 NOTE — Telephone Encounter (Signed)
Called pt, his phone went straight to VM. Message left for pt to return my phone call on Monday.

## 2016-11-06 NOTE — Telephone Encounter (Signed)
-----   Message from Kinnie Feil, MD sent at 11/06/2016  2:11 PM EST ----- Please help patient schedule nurse visit for Hep C vaccination. Thanks.

## 2016-11-09 LAB — PSA, TOTAL AND FREE
PSA, % Free: 21 % — ABNORMAL LOW (ref >25)
PSA, Free: 0.8 ng/mL
PSA, Total: 3.8 ng/mL (ref <=4.0)

## 2016-11-12 ENCOUNTER — Encounter: Payer: Self-pay | Admitting: Family Medicine

## 2016-11-12 ENCOUNTER — Telehealth: Payer: Self-pay | Admitting: Family Medicine

## 2016-11-12 NOTE — Telephone Encounter (Addendum)
Call back message left.  Note: Total PSA is normal % Free PSA is low which is significant in the setting of prostate cancer and high total PSA or  High normal PSA.   I will recommend rectal exam for further assessment. He can come see me for this or I can refer him to a urologist which ever he prefers.  Will discuss when he calls back.  Will discuss his FLP as well.

## 2016-11-19 ENCOUNTER — Telehealth: Payer: Self-pay | Admitting: Family Medicine

## 2016-11-19 DIAGNOSIS — N400 Enlarged prostate without lower urinary tract symptoms: Secondary | ICD-10-CM

## 2016-11-19 NOTE — Telephone Encounter (Signed)
I discussed all test result with patient. His Triglyceride is elevated. I gave an option of starting statin vs fish oil. He prefers fish oil for now which is totally appropriate. He will get this OTC.  He has normal total PSA. However PSA % is minimally  low which could suggest prostate cancer especially if total PSA is elevated which is not the case in this situation.  Either way, I recommended urology assessment for his BPH and rectal exam by them to assess for prostate cancer. He agreed with plan. All questions were answered.

## 2017-08-03 ENCOUNTER — Emergency Department (HOSPITAL_COMMUNITY): Payer: Medicare HMO

## 2017-08-03 ENCOUNTER — Other Ambulatory Visit: Payer: Self-pay

## 2017-08-03 ENCOUNTER — Observation Stay (HOSPITAL_COMMUNITY)
Admission: EM | Admit: 2017-08-03 | Discharge: 2017-08-04 | Disposition: A | Discharge status: Home / Self Care | Payer: Medicare HMO | Attending: Family Medicine | Admitting: Family Medicine

## 2017-08-03 ENCOUNTER — Encounter (HOSPITAL_COMMUNITY): Payer: Self-pay | Admitting: *Deleted

## 2017-08-03 DIAGNOSIS — I639 Cerebral infarction, unspecified: Principal | ICD-10-CM | POA: Insufficient documentation

## 2017-08-03 DIAGNOSIS — I129 Hypertensive chronic kidney disease with stage 1 through stage 4 chronic kidney disease, or unspecified chronic kidney disease: Secondary | ICD-10-CM | POA: Diagnosis not present

## 2017-08-03 DIAGNOSIS — F1410 Cocaine abuse, uncomplicated: Secondary | ICD-10-CM | POA: Insufficient documentation

## 2017-08-03 DIAGNOSIS — G8929 Other chronic pain: Secondary | ICD-10-CM | POA: Insufficient documentation

## 2017-08-03 DIAGNOSIS — R4781 Slurred speech: Secondary | ICD-10-CM | POA: Diagnosis not present

## 2017-08-03 DIAGNOSIS — R2681 Unsteadiness on feet: Secondary | ICD-10-CM | POA: Insufficient documentation

## 2017-08-03 DIAGNOSIS — N400 Enlarged prostate without lower urinary tract symptoms: Secondary | ICD-10-CM | POA: Insufficient documentation

## 2017-08-03 DIAGNOSIS — R69 Illness, unspecified: Secondary | ICD-10-CM | POA: Diagnosis not present

## 2017-08-03 DIAGNOSIS — F1210 Cannabis abuse, uncomplicated: Secondary | ICD-10-CM

## 2017-08-03 DIAGNOSIS — R7303 Prediabetes: Secondary | ICD-10-CM | POA: Diagnosis not present

## 2017-08-03 DIAGNOSIS — R410 Disorientation, unspecified: Secondary | ICD-10-CM | POA: Diagnosis not present

## 2017-08-03 DIAGNOSIS — N183 Chronic kidney disease, stage 3 (moderate): Secondary | ICD-10-CM | POA: Insufficient documentation

## 2017-08-03 DIAGNOSIS — Z23 Encounter for immunization: Secondary | ICD-10-CM | POA: Diagnosis not present

## 2017-08-03 DIAGNOSIS — I6523 Occlusion and stenosis of bilateral carotid arteries: Secondary | ICD-10-CM | POA: Insufficient documentation

## 2017-08-03 LAB — CBG MONITORING, ED
GLUCOSE-CAPILLARY: 59 mg/dL — AB (ref 65–99)
Glucose-Capillary: 207 mg/dL — ABNORMAL HIGH (ref 65–99)

## 2017-08-03 LAB — I-STAT CHEM 8, ED
BUN: 21 mg/dL — ABNORMAL HIGH (ref 6–20)
CALCIUM ION: 1.15 mmol/L (ref 1.15–1.40)
CHLORIDE: 107 mmol/L (ref 101–111)
Creatinine, Ser: 1.3 mg/dL — ABNORMAL HIGH (ref 0.61–1.24)
GLUCOSE: 113 mg/dL — AB (ref 65–99)
HCT: 43 % (ref 39.0–52.0)
Hemoglobin: 14.6 g/dL (ref 13.0–17.0)
Potassium: 3.8 mmol/L (ref 3.5–5.1)
Sodium: 143 mmol/L (ref 135–145)
TCO2: 25 mmol/L (ref 22–32)

## 2017-08-03 LAB — COMPREHENSIVE METABOLIC PANEL
ALT: 33 U/L (ref 17–63)
ANION GAP: 7 (ref 5–15)
AST: 39 U/L (ref 15–41)
Albumin: 3.6 g/dL (ref 3.5–5.0)
Alkaline Phosphatase: 87 U/L (ref 38–126)
BUN: 18 mg/dL (ref 6–20)
CHLORIDE: 107 mmol/L (ref 101–111)
CO2: 24 mmol/L (ref 22–32)
CREATININE: 1.39 mg/dL — AB (ref 0.61–1.24)
Calcium: 8.7 mg/dL — ABNORMAL LOW (ref 8.9–10.3)
GFR, EST NON AFRICAN AMERICAN: 53 mL/min — AB (ref >60)
Glucose, Bld: 113 mg/dL — ABNORMAL HIGH (ref 65–99)
POTASSIUM: 3.8 mmol/L (ref 3.5–5.1)
SODIUM: 138 mmol/L (ref 135–145)
Total Bilirubin: 0.6 mg/dL (ref 0.3–1.2)
Total Protein: 7.8 g/dL (ref 6.5–8.1)

## 2017-08-03 LAB — CBC
HEMATOCRIT: 39.1 % (ref 39.0–52.0)
Hemoglobin: 13 g/dL (ref 13.0–17.0)
MCH: 28.4 pg (ref 26.0–34.0)
MCHC: 33.2 g/dL (ref 30.0–36.0)
MCV: 85.4 fL (ref 78.0–100.0)
PLATELETS: 253 10*3/uL (ref 150–400)
RBC: 4.58 MIL/uL (ref 4.22–5.81)
RDW: 14.6 % (ref 11.5–15.5)
WBC: 4.1 10*3/uL (ref 4.0–10.5)

## 2017-08-03 LAB — APTT: aPTT: 28 seconds (ref 24–36)

## 2017-08-03 LAB — DIFFERENTIAL
BASOS PCT: 1 %
Basophils Absolute: 0 10*3/uL (ref 0.0–0.1)
EOS ABS: 0.1 10*3/uL (ref 0.0–0.7)
EOS PCT: 2 %
Lymphocytes Relative: 54 %
Lymphs Abs: 2.2 10*3/uL (ref 0.7–4.0)
MONO ABS: 0.3 10*3/uL (ref 0.1–1.0)
MONOS PCT: 8 %
Neutro Abs: 1.5 10*3/uL — ABNORMAL LOW (ref 1.7–7.7)
Neutrophils Relative %: 37 %

## 2017-08-03 LAB — PROTIME-INR
INR: 1
PROTHROMBIN TIME: 13.1 s (ref 11.4–15.2)

## 2017-08-03 LAB — I-STAT TROPONIN, ED: TROPONIN I, POC: 0 ng/mL (ref 0.00–0.08)

## 2017-08-03 MED ORDER — STROKE: EARLY STAGES OF RECOVERY BOOK
Freq: Once | Status: AC
  Administered 2017-08-04: 07:00:00
  Filled 2017-08-03: qty 1

## 2017-08-03 MED ORDER — ATORVASTATIN CALCIUM 80 MG PO TABS
80.0000 mg | ORAL_TABLET | Freq: Every day | ORAL | Status: DC
  Administered 2017-08-04: 80 mg via ORAL
  Filled 2017-08-03: qty 1

## 2017-08-03 MED ORDER — ACETAMINOPHEN 325 MG PO TABS: 650.0000 mg | ORAL_TABLET | ORAL | Status: DC | PRN

## 2017-08-03 MED ORDER — ACETAMINOPHEN 160 MG/5ML PO SOLN: 650.0000 mg | ORAL | Status: DC | PRN

## 2017-08-03 MED ORDER — INSULIN ASPART 100 UNIT/ML ~~LOC~~ SOLN: 0.0000 [IU] | Freq: Three times a day (TID) | SUBCUTANEOUS | Status: DC

## 2017-08-03 MED ORDER — INFLUENZA VAC SPLIT QUAD 0.5 ML IM SUSY
0.5000 mL | PREFILLED_SYRINGE | INTRAMUSCULAR | Status: AC
  Administered 2017-08-04: 0.5 mL via INTRAMUSCULAR
  Filled 2017-08-03: qty 0.5

## 2017-08-03 MED ORDER — SODIUM CHLORIDE 0.9 % IV SOLN
INTRAVENOUS | Status: DC
  Administered 2017-08-04: 07:00:00 via INTRAVENOUS

## 2017-08-03 MED ORDER — INSULIN ASPART 100 UNIT/ML ~~LOC~~ SOLN: 0.0000 [IU] | Freq: Every day | SUBCUTANEOUS | Status: DC

## 2017-08-03 MED ORDER — SENNOSIDES-DOCUSATE SODIUM 8.6-50 MG PO TABS: 1.0000 | ORAL_TABLET | Freq: Every evening | ORAL | Status: DC | PRN

## 2017-08-03 MED ORDER — ENOXAPARIN SODIUM 40 MG/0.4ML ~~LOC~~ SOLN: 40.0000 mg | SUBCUTANEOUS | Status: DC

## 2017-08-03 MED ORDER — ACETAMINOPHEN 650 MG RE SUPP: 650.0000 mg | RECTAL | Status: DC | PRN

## 2017-08-03 NOTE — H&P (Signed)
[facility] Admission History and Physical Service Pager: [phone]  Patient name: David [name] Medical record number: [id number] Date of birth: July 28, 1956 Age: 61 y.o. Gender: male  Primary Care Provider: Kinnie Feil, MD Consultants: Neurology Code Status: Full code (obtained on admission)  Chief Complaint: Slurred speech and aphasia  Assessment and Plan: David [name] is a 61 y.o. male presenting with slurred speech and aphasia. PMH is significant for hyperlipidemia,?  Hypertension, BPH, cocaine use, chronic pain and prediabetes  Left BG infarct: patient with slurred speech, aphasia and some memory issue.  Otherwise, neuro exam within normal limits except for diminished left patellar reflex compared to right which could also be due to left knee surgery. CT head concerning for acute left basal ganglia infarct.  MRI brain with late acute/early subacute infarction of left putamen, coronary radiata and caudate body measuring about 5.1 cc, focus of microhemorrhage and mild local mass-effect.  Patient also with some chronic infarcts. MRA without large vessel occlusion, aneurysm or stenosis.  Patient is already outside the TPA window. -Admit to Neuro bed. Attending Dr. Mingo Amber -Neuro consulted in ED -Carotid doppler (if no CTA head and neck) -Risk stratification labs (lipid panel, A1c and TSH) -UDS.  Admits cocaine use -Neurovascular check q2hrs -Cardiac monitor -Permissive hypertension -Atorvastatin 80 mg daily -Awaiting on your recommendation for antiplatelet -PT/OT/SLP.  Already passed bedside swallow screen -Fall precaution  ?Hypertension: Hypertensive to 170s over 109 on arrival to ED.  Not sure if this is due to hypertension or secondary to CVA -Permissive hypertension  Prediabetes: Last A1c 6.2 and 10/2016 -Check A1c -SSI-thin -CBG before meals at bedtime  CKD-3: Serum creatinine 1.39.  Baseline ranges from 1.1-1.2 -BMP in the morning -Avoid  nephrotoxic medication.  He is on oral diclofenac at home  BPH: On Flomax at home.  Denies urinary symptoms. -Monitor urine output -Bladder scan if needed  Cocaine use: discussed about the impact of this on his health.  Advised to quit.  No anginal symptoms.  FEN/GI: -Carb modified diet  Prophylaxis: SCD given microhemorrhage  Disposition: Admit for stroke workup and management  History of Present Illness:  David [name] is a 61 y.o. male presenting with slurred speech and aphasia for 2 days.  Patient reports waking up with slurred speech and aphasia about 2 days ago.  He came into ED today because he slurred speech and aphasia did not get better.  He denies headache, vision change, numbness, tingling, weakness and facial drooping.  He says he felt off balance.  He also reports some difficulty with memory.  He reports cocaine use as late as yesterday. He denies recent illness leading to this.  He denies fevers, URI symptoms or chills. No history of stroke or heart attack.  He denies history of hypertension although this is on his past medical history. Patient denies history of irregular heartbeat, blood clots or previous stroke.  Denies smoking cigarettes.  Denies drinking alcohol.  Uses cocaine 4-5 times a day.  ED course: Vital signs significant for hypertension to 171/103 on arrival to ED.  CMP significant for mild AKI at 1.39.  CBC with differential within normal limits.  Initial troponin negative.  CT head concerning for acute left basal ganglia and subacute/chronic right basal ganglia CVA.  Neurology was consulted in ED. family medicine nurse called to admit patient for possible stroke workup  Review Of Systems:  Review of Systems  Constitutional: Negative for fever and weight loss.  HENT: Negative for  sore throat.   Eyes: Negative for blurred vision, photophobia and pain.  Respiratory: Negative for cough and shortness of breath.   Cardiovascular: Negative for chest pain,  palpitations and leg swelling.  Gastrointestinal: Negative for abdominal pain, blood in stool, diarrhea, melena and vomiting.  Genitourinary: Negative for dysuria.  Musculoskeletal: Negative for myalgias.  Skin: Negative for rash.  Neurological: Positive for speech change. Negative for dizziness, tingling, tremors, sensory change, focal weakness, weakness and headaches.  Endo/Heme/Allergies: Does not bruise/bleed easily.  Psychiatric/Behavioral: Positive for substance abuse. Negative for depression. The patient is not nervous/anxious.        Cocaine    Patient Active Problem List   Diagnosis Date Noted  . Slurred speech 08/03/2017  . Left hip pain 11/06/2016  . Pre-diabetes 11/06/2016  . Labral tear of shoulder 01/25/2014  . Adhesive capsulitis of left shoulder 12/05/2013  . Seasonal allergies 12/27/2012  . Trigger point of neck 08/16/2012  . Hyperlipidemia 08/08/2012  . Elevated LFTs 08/08/2012  . Cocaine abuse ([employer]) 08/08/2012  . Chronic pain 06/01/2012  . BPH (benign prostatic hyperplasia) 09/09/2011  . ROTATOR CUFF TEAR 12/03/2008    Past Medical History: Past Medical History:  Diagnosis Date  . Arthritis   . Benign prostatic hyperplasia   . Hypertension     Past Surgical History: Past Surgical History:  Procedure Laterality Date  . SHOULDER ARTHROSCOPY W/ ROTATOR CUFF REPAIR  10/21/10   At [facility], R shoulder    Social History: Social History   Tobacco Use  . Smoking status: Never Smoker  . Smokeless tobacco: Never Used  Substance Use Topics  . Alcohol use: Yes    Comment: occasion  . Drug use: No   Additional social history:   Please also refer to relevant sections of EMR.  Family History: History reviewed. No pertinent family history. (If not completed, MUST add something in)  Allergies and Medications: No Known Allergies No current facility-administered medications on file prior to encounter.    Current Outpatient Medications on File Prior to  Encounter  Medication Sig Dispense Refill  . diclofenac (VOLTAREN) 75 MG EC tablet Take 1 tablet (75 mg total) by mouth 2 (two) times daily as needed. (Patient taking differently: Take 75 mg by mouth 2 (two) times daily as needed for mild pain. ) 60 tablet 3  . tamsulosin (FLOMAX) 0.4 MG CAPS capsule Take 1 capsule (0.4 mg total) by mouth daily. 90 capsule 2  . Aspirin (ADULT ASPIRIN LOW STRENGTH) 81 MG EC tablet Take 1 tablet (81 mg total) by mouth daily. (Patient not taking: Reported on 11/06/2016) 90 tablet 3  . traMADol (ULTRAM) 50 MG tablet Take 1 tablet (50 mg total) by mouth every 6 (six) hours as needed. (Patient not taking: Reported on 11/06/2016) 15 tablet 0    Objective: BP (!) 152/91   Pulse 89   Temp 98.4 F (36.9 C) (Oral)   Resp (!) 21   Ht 5\' 10"  (1.778 m)   Wt 160 lb (72.6 kg)   SpO2 100%   BMI 22.96 kg/m  Exam: GEN: appears well, no apparent distress. Head: normocephalic and atraumatic  Eyes: conjunctiva without injection, sclera anicteric Ears: external ear and ear canal normal Nares: no rhinorrhea, congestion or erythema Oropharynx: mmm without erythema or exudation, poor dentition HEM: negative for cervical or periauricular lymphadenopathies CVS: RRR, nl s1 & s2, no murmurs, no edema RESP: no IWOB, good air movement bilaterally, CTAB GI: BS present & normal, soft, NTND GU: no suprapubic or  CVA tenderness MSK: no focal tenderness or notable swelling SKIN: no apparent skin lesion ENDO: negative thyromegally NEURO: Awake, alert and oriented x4. Cranial nerves II-XII intact except for slurred speech, motor 5/5 in all muscle groups of UE and LE bilaterally, normal tone, light sensation intact in all dermatomes of upper and lower ext bilaterally, no pronator drift, biceps reflexes 2+ bilaterally, left patellar reflex 1+, right patellar reflex 2+, finger to nose intact, gait not tested PSYCH: euthymic mood with congruent affect  Labs and Imaging: CBC BMET  Recent Labs   Lab 08/03/17 1325 08/03/17 1335  WBC 4.1  --   HGB 13.0 14.6  HCT 39.1 43.0  PLT 253  --    Recent Labs  Lab 08/03/17 1325 08/03/17 1335  NA 138 143  K 3.8 3.8  CL 107 107  CO2 24  --   BUN 18 21*  CREATININE 1.39* 1.30*  GLUCOSE 113* 113*  CALCIUM 8.7*  --      Ct Head Wo Contrast Result Date: 08/03/2017  IMPRESSION: 1. Age-indeterminate but likely chronic small vessel ischemic disease of periventricular white matter. 2. More recent appearing infarct of the left basal ganglia given what appears to be slight positive mass effect on the adjacent left lateral ventricle. MRI may help for better assessment. No hemorrhage noted. 3. More remote appearing right basal ganglial lacunar infarct with mild ex vacuo dilatation of the adjacent right lateral ventricle. Electronically Signed   By: Ashley Royalty M.D.   On: 08/03/2017 14:08   Mr Angiogram Head Wo Contrast Result Date: 08/03/2017  IMPRESSION: 1. Late acute/ Early subacute infarction centered left putamen, corona radiata, and caudate body measuring 5.1 cc. Single associated focus of microhemorrhage. Mild local mass effect. 2. No additional evidence for infarction, hemorrhage, mass effect, or herniation. 3. Moderate chronic microvascular ischemic changes and parenchymal volume loss of the brain. Chronic lacunar infarcts in left pons and right anterior basal ganglia. 4. Unremarkable MRA of the head. No large vessel occlusion, aneurysm, or significant stenosis is identified. These results were called by telephone at the time of interpretation on 08/03/2017 at 10:23 pm to [place], who verbally acknowledged these results. Electronically Signed   By: Kristine Garbe M.D.   On: 08/03/2017 22:26   Mr Brain Wo Contrast Result Date: 08/03/2017  IMPRESSION: 1. Late acute/ Early subacute infarction centered left putamen, corona radiata, and caudate body measuring 5.1 cc. Single associated focus of microhemorrhage. Mild local mass  effect. 2. No additional evidence for infarction, hemorrhage, mass effect, or herniation. 3. Moderate chronic microvascular ischemic changes and parenchymal volume loss of the brain. Chronic lacunar infarcts in left pons and right anterior basal ganglia. 4. Unremarkable MRA of the head. No large vessel occlusion, aneurysm, or significant stenosis is identified. These results were called by telephone at the time of interpretation on 08/03/2017 at 10:23 pm to [place], who verbally acknowledged these results. Electronically Signed   By: Kristine Garbe M.D.   On: 08/03/2017 22:26    Mercy Riding, MD 08/03/2017, 9:53 PM PGY-3, [place] pager: [phone], text pages welcome

## 2017-08-03 NOTE — ED Notes (Signed)
Pt passed swallow screen. Pt given apple juice and crackers for low CBG. Pt A&O x4 at this time.

## 2017-08-03 NOTE — ED Notes (Signed)
Pt was found attempting to get up to shut the door to his room. This RN redirected pt to his bed when pt lost his balance and stumbled into the bed. Pt denies any injury. This RN reiterated to pt to not get up without assistance d/t unsteady gait. Call bell in reach.

## 2017-08-03 NOTE — ED Notes (Signed)
Patient transported to MRI 

## 2017-08-03 NOTE — ED Triage Notes (Signed)
Per pt and family member. Pt has been disoriented and difficulty speaking for over one week. Has unsteady gait and facial droop. Mild weakness noted to right arm. Pt having difficulty answering questions at triage.

## 2017-08-03 NOTE — ED Notes (Signed)
When name called to recheck VS pt attempted to stand and stumbled to the right side. Pt caught himself on chair and sat back down. Pt asked not to stand without assistance d/t unsteady gait. Charge nurse aware

## 2017-08-03 NOTE — ED Provider Notes (Signed)
[facility] EMERGENCY DEPARTMENT Provider Note   CSN: [id number] Arrival date & time: 08/03/17  1316     History   Chief Complaint Chief Complaint  Patient presents with  . Stroke Symptoms    HPI David [name] is a 61 y.o. male.  HPI David [name] is a 61 y.o. male with history of prediabetes, hypertension, hyperlipidemia, presents to emergency department complaining of strokelike symptoms.  Patient states that he noticed on Sunday that he has had slurred speech, difficulty with word finding, and difficulty ambulating.  He states that he thought his symptoms would improve and waited before coming to emergency department.  He does admit to cocaine use, last used yesterday morning.  He denies any pain anywhere.  He denies any blurred vision.  No difficulty swallowing or breathing.  No weakness in extremities.  He denies any similar symptoms in the past.  He denies any dizziness or lightheadedness, however states when he walks he leans to the right and falls.  No recent head injury.  Past Medical History:  Diagnosis Date  . Arthritis   . Benign prostatic hyperplasia   . Hypertension     Patient Active Problem List   Diagnosis Date Noted  . Left hip pain 11/06/2016  . Pre-diabetes 11/06/2016  . Labral tear of shoulder 01/25/2014  . Adhesive capsulitis of left shoulder 12/05/2013  . Seasonal allergies 12/27/2012  . Trigger point of neck 08/16/2012  . Hyperlipidemia 08/08/2012  . Elevated LFTs 08/08/2012  . Cocaine abuse ([place]) 08/08/2012  . Chronic pain 06/01/2012  . BPH (benign prostatic hyperplasia) 09/09/2011  . ROTATOR CUFF TEAR 12/03/2008    Past Surgical History:  Procedure Laterality Date  . SHOULDER ARTHROSCOPY W/ ROTATOR CUFF REPAIR  10/21/10   At [facility], R shoulder       Home Medications    Prior to Admission medications   Medication Sig Start Date End Date Taking? Authorizing Provider  diclofenac (VOLTAREN) 75 MG EC tablet Take 1 tablet (75  mg total) by mouth 2 (two) times daily as needed. Patient taking differently: Take 75 mg by mouth 2 (two) times daily as needed for mild pain.  11/06/16  Yes Kinnie Feil, MD  tamsulosin (FLOMAX) 0.4 MG CAPS capsule Take 1 capsule (0.4 mg total) by mouth daily. 11/06/16  Yes Kinnie Feil, MD  Aspirin (ADULT ASPIRIN LOW STRENGTH) 81 MG EC tablet Take 1 tablet (81 mg total) by mouth daily. Patient not taking: Reported on 11/06/2016 02/11/11   Silverio Decamp, MD  traMADol (ULTRAM) 50 MG tablet Take 1 tablet (50 mg total) by mouth every 6 (six) hours as needed. Patient not taking: Reported on 11/06/2016 06/06/16   Orpah Greek, MD    Family History History reviewed. No pertinent family history.  Social History Social History   Tobacco Use  . Smoking status: Never Smoker  . Smokeless tobacco: Never Used  Substance Use Topics  . Alcohol use: Yes    Comment: occasion  . Drug use: No     Allergies   Patient has no known allergies.   Review of Systems Review of Systems  Constitutional: Negative for chills and fever.  Respiratory: Negative for cough, chest tightness and shortness of breath.   Cardiovascular: Negative for chest pain, palpitations and leg swelling.  Gastrointestinal: Negative for abdominal distention, abdominal pain, diarrhea, nausea and vomiting.  Genitourinary: Negative for dysuria, frequency, hematuria and urgency.  Musculoskeletal: Negative for arthralgias, myalgias, neck pain and neck  stiffness.  Skin: Negative for rash.  Allergic/Immunologic: Negative for immunocompromised state.  Neurological: Positive for speech difficulty, weakness and headaches. Negative for dizziness, light-headedness and numbness.  All other systems reviewed and are negative.    Physical Exam Updated Vital Signs BP (!) 145/94   Pulse 76   Temp 98.4 F (36.9 C) (Oral)   Resp 16   Ht 5\' 10"  (1.778 m)   Wt 72.6 kg (160 lb)   SpO2 100%   BMI 22.96 kg/m   Physical  Exam  Constitutional: He is oriented to person, place, and time. He appears well-developed and well-nourished. No distress.  HENT:  Head: Normocephalic and atraumatic.  Eyes: Conjunctivae are normal.  Neck: Neck supple.  Cardiovascular: Normal rate, regular rhythm and normal heart sounds.  Pulmonary/Chest: Effort normal. No respiratory distress. He has no wheezes. He has no rales.  Abdominal: Soft. Bowel sounds are normal. He exhibits no distension. There is no tenderness. There is no rebound.  Musculoskeletal: He exhibits no edema.  Neurological: He is alert and oriented to person, place, and time.  Right sided facial droop.  Patient with dysarthria, slurred speech.  Previous weakness noted.  Slight pronator drift on the right.  Unable to ambulate, leans to the right  Skin: Skin is warm and dry.  Nursing note and vitals reviewed.    ED Treatments / Results  Labs (all labs ordered are listed, but only abnormal results are displayed) Labs Reviewed  DIFFERENTIAL - Abnormal; Notable for the following components:      Result Value   Neutro Abs 1.5 (*)    All other components within normal limits  COMPREHENSIVE METABOLIC PANEL - Abnormal; Notable for the following components:   Glucose, Bld 113 (*)    Creatinine, Ser 1.39 (*)    Calcium 8.7 (*)    GFR calc non Af Amer 53 (*)    All other components within normal limits  I-STAT CHEM 8, ED - Abnormal; Notable for the following components:   BUN 21 (*)    Creatinine, Ser 1.30 (*)    Glucose, Bld 113 (*)    All other components within normal limits  PROTIME-INR  APTT  CBC  RAPID URINE DRUG SCREEN, HOSP PERFORMED  I-STAT TROPONIN, ED  CBG MONITORING, ED    EKG  EKG Interpretation None       Radiology Ct Head Wo Contrast  Result Date: 08/03/2017 CLINICAL DATA:  Generalized weakness and inability to speak clearly for 1 week. Disoriented and confused. EXAM: CT HEAD WITHOUT CONTRAST TECHNIQUE: Contiguous axial images were  obtained from the base of the skull through the vertex without intravenous contrast. COMPARISON:  None. FINDINGS: Brain: Age-indeterminate small vessel ischemic disease of periventricular white matter. Remote appearing right basal ganglial infarct with mild ex vacuo dilatation of the adjacent right lateral ventricle. Age-indeterminate though more recent appearing hypodense appearance of the left basal ganglia given slight positive mass effect suggested on the adjacent left lateral ventricle. No acute intracranial hemorrhage, large vascular territory infarct, midline shift or edema. Idiopathic basal ganglial calcifications are noted left-greater-than-right. Intact midline fourth ventricle and basal cisterns. No intra-axial mass nor extra-axial fluid collections. Vascular: No hyperdense vessel or unexpected calcification. Skull: Normal. Negative for fracture or focal lesion. Sinuses/Orbits: No acute finding. Other: None. IMPRESSION: 1. Age-indeterminate but likely chronic small vessel ischemic disease of periventricular white matter. 2. More recent appearing infarct of the left basal ganglia given what appears to be slight positive mass effect on the adjacent left  lateral ventricle. MRI may help for better assessment. No hemorrhage noted. 3. More remote appearing right basal ganglial lacunar infarct with mild ex vacuo dilatation of the adjacent right lateral ventricle. Electronically Signed   By: Ashley Royalty M.D.   On: 08/03/2017 14:08    Procedures Procedures (including critical care time)  Medications Ordered in ED Medications - No data to display   Initial Impression / Assessment and Plan / ED Course  I have reviewed the triage vital signs and the nursing notes.  Pertinent labs & imaging results that were available during my care of the patient were reviewed by me and considered in my medical decision making (see chart for details).     Patient with stroke symptoms for several days.  Admits to  cocaine use.  History of hypertension, prediabetes.  Patient's blood work and CT were obtained in triage, CT scan showing age indeterminate small vessel ischemic disease and more recent appearing infarct in the left basal ganglia.  I will get MRI.  Blood work overall unremarkable.  I will get patient admitted.   Spoke with neurology, Dr. Cheral Marker, will consult.  Spoke with family practice will admit patient.   Vitals:   08/03/17 2000 08/03/17 2100 08/03/17 2115 08/03/17 2230  BP: (!) 152/103 (!) 154/91 (!) 152/91   Pulse: 78 94 89 78  Resp: 16 (!) 24 (!) 21 18  Temp:      TempSrc:      SpO2: 99% 100% 100% 100%  Weight:      Height:        Final Clinical Impressions(s) / ED Diagnoses   Final diagnoses:  Cerebral infarction, unspecified mechanism [facility])    ED Discharge Orders    None       Jeannett Senior, PA-C 08/03/17 2310    Quintella Reichert, MD 08/05/17 1304

## 2017-08-04 ENCOUNTER — Inpatient Hospital Stay (HOSPITAL_BASED_OUTPATIENT_CLINIC_OR_DEPARTMENT_OTHER): Payer: Medicare HMO

## 2017-08-04 ENCOUNTER — Inpatient Hospital Stay (HOSPITAL_COMMUNITY): Payer: Medicare HMO

## 2017-08-04 DIAGNOSIS — R4781 Slurred speech: Secondary | ICD-10-CM

## 2017-08-04 DIAGNOSIS — F1410 Cocaine abuse, uncomplicated: Secondary | ICD-10-CM

## 2017-08-04 DIAGNOSIS — I6789 Other cerebrovascular disease: Secondary | ICD-10-CM | POA: Diagnosis not present

## 2017-08-04 DIAGNOSIS — I6381 Other cerebral infarction due to occlusion or stenosis of small artery: Secondary | ICD-10-CM

## 2017-08-04 DIAGNOSIS — E785 Hyperlipidemia, unspecified: Secondary | ICD-10-CM

## 2017-08-04 DIAGNOSIS — I63312 Cerebral infarction due to thrombosis of left middle cerebral artery: Secondary | ICD-10-CM

## 2017-08-04 DIAGNOSIS — R69 Illness, unspecified: Secondary | ICD-10-CM | POA: Diagnosis not present

## 2017-08-04 DIAGNOSIS — I639 Cerebral infarction, unspecified: Secondary | ICD-10-CM | POA: Diagnosis not present

## 2017-08-04 DIAGNOSIS — F1210 Cannabis abuse, uncomplicated: Secondary | ICD-10-CM

## 2017-08-04 HISTORY — DX: Other cerebral infarction due to occlusion or stenosis of small artery: I63.81

## 2017-08-04 HISTORY — DX: Cerebral infarction, unspecified: I63.9

## 2017-08-04 LAB — ECHOCARDIOGRAM COMPLETE
CHL CUP MV DEC (S): 335
E decel time: 335 msec
E/e' ratio: 4.85
FS: 38 % (ref 28–44)
Height: 70 in
IV/PV OW: 1.16
LA ID, A-P, ES: 35 mm
LA vol: 43 mL
LADIAMINDEX: 1.85 cm/m2
LAVOLA4C: 29 mL
LAVOLIN: 22.7 mL/m2
LEFT ATRIUM END SYS DIAM: 35 mm
LV E/e'average: 4.85
LV PW d: 8.55 mm — AB (ref 0.6–1.1)
LV TDI E'MEDIAL: 8.88
LVEEMED: 4.85
LVELAT: 11.5 cm/s
LVOT area: 3.8 cm2
LVOT diameter: 22 mm
MV pk A vel: 75.5 m/s
MV pk E vel: 55.8 m/s
TDI e' lateral: 11.5
WEIGHTICAEL: 2560 [oz_av]

## 2017-08-04 LAB — RAPID URINE DRUG SCREEN, HOSP PERFORMED
Amphetamines: NOT DETECTED
BARBITURATES: NOT DETECTED
BENZODIAZEPINES: NOT DETECTED
Cocaine: POSITIVE — AB
Opiates: NOT DETECTED
Tetrahydrocannabinol: POSITIVE — AB

## 2017-08-04 LAB — LIPID PANEL
CHOLESTEROL: 202 mg/dL — AB (ref 0–200)
HDL: 62 mg/dL (ref >40)
LDL Cholesterol: 114 mg/dL — ABNORMAL HIGH (ref 0–99)
Total CHOL/HDL Ratio: 3.3 RATIO
Triglycerides: 130 mg/dL (ref <150)
VLDL: 26 mg/dL (ref 0–40)

## 2017-08-04 LAB — GLUCOSE, CAPILLARY
GLUCOSE-CAPILLARY: 105 mg/dL — AB (ref 65–99)
GLUCOSE-CAPILLARY: 91 mg/dL (ref 65–99)

## 2017-08-04 LAB — HEMOGLOBIN A1C
HEMOGLOBIN A1C: 6.2 % — AB (ref 4.8–5.6)
MEAN PLASMA GLUCOSE: 131.24 mg/dL

## 2017-08-04 LAB — HIV ANTIBODY (ROUTINE TESTING W REFLEX): HIV Screen 4th Generation wRfx: NONREACTIVE

## 2017-08-04 MED ORDER — ASPIRIN 325 MG PO TBEC: 325.0000 mg | DELAYED_RELEASE_TABLET | Freq: Every day | ORAL | 0 refills | Status: DC

## 2017-08-04 MED ORDER — ASPIRIN EC 325 MG PO TBEC
325.0000 mg | DELAYED_RELEASE_TABLET | Freq: Every day | ORAL | Status: DC
  Administered 2017-08-04: 325 mg via ORAL
  Filled 2017-08-04: qty 1

## 2017-08-04 MED ORDER — ATORVASTATIN CALCIUM 80 MG PO TABS: 80.0000 mg | ORAL_TABLET | Freq: Every day | ORAL | 0 refills | Status: DC

## 2017-08-04 NOTE — Progress Notes (Signed)
PT Cancellation Note  Patient Details Name: David [name] MRN: [id number] DOB: 23-Dec-1955   Cancelled Treatment:    Reason Eval/Treat Not Completed: (P) Patient at procedure or test/unavailable PT will check back this afternoon for evaluation.  Daleah Coulson B. Migdalia Dk PT, DPT Acute Rehabilitation  [phone] Pager [phone]     [place] 08/04/2017, 11:14 AM

## 2017-08-04 NOTE — Progress Notes (Signed)
Family Medicine Teaching Service Daily Progress Note Intern Pager: [phone]  Patient name: David [name] Medical record number: [id number] Date of birth: 21-Jun-1956 Age: 61 y.o. Gender: male  Primary Care Provider: Kinnie Feil, MD Consultants: Neurology Code Status: Full  Pt Overview and Major Events to Date:  David [name] is a 61 y.o. male presenting with slurred speech and aphasia. PMH is significant for hyperlipidemia,?  Hypertension, BPH, cocaine use, chronic pain and prediabetes  Assessment and Plan:  Left BG infarct: patient with slurred speech, aphasia and some memory issue on admission. Neuro exam within normal limits. CT head concerning for acute left basal ganglia infarct.  MRI brain with late acute/early subacute infarction of left putamen, coronary radiata and caudate body measuring about 5.1 cc, focus of microhemorrhage and mild local mass-effect.  Patient also with some chronic infarcts. MRA without large vessel occlusion, aneurysm or stenosis.   -Neuro consulted and following, appreciate recommendations -Carotid doppler ordered -Risk stratification labs  -UDS pending.  Admits cocaine use -Neurovascular check q2hrs -Cardiac monitor -Permissive hypertension -Atorvastatin 80 mg daily; ASA 325 mg daily -PT/OT/SLP to eval.  Passed bedside swallow screen -Fall precaution  Hypertension: Hypertensive to 150's/90's overnight -Permissive hypertension  Prediabetes: A1c 6.2 and 08/03/2017, previous A1c was 6.2 - Consider metformin as an outpatient  CKD-3: Serum creatinine 1.39.  Baseline ranges from 1.1-1.2 -BMP in the morning -Avoid nephrotoxic medication.  He is on oral diclofenac at home  BPH: On Flomax at home.  Denies urinary symptoms. -Monitor urine output -Bladder scan if needed  Cocaine use: discussed about the impact of this on his health.  Advised to quit.  No anginal symptoms.  FEN/GI: -Carb modified diet  Prophylaxis: SCD given  microhemorrhage  Disposition: medically stable for discharge after ECHO and Carotid doppler  Subjective:  Patient states he is feeling great this morning. He is alert and eating breakfast.   Objective: Temp:  [98.4 F (36.9 C)-98.9 F (37.2 C)] 98.4 F (36.9 C) (12/05 0031) Pulse Rate:  [62-94] 62 (12/05 0031) Resp:  [16-24] 18 (12/05 0031) BP: (140-171)/(91-103) 150/94 (12/05 0031) SpO2:  [99 %-100 %] 100 % (12/05 0031) Weight:  [160 lb (72.6 kg)] 160 lb (72.6 kg) (12/04 1329) Physical Exam: General: NAD, pleasant, speaking in full sentences  Eyes: PERRL, EOMI, no conjunctival pallor or injection Cardiovascular: RRR, no m/r/g, no LE edema Respiratory: CTA BL, normal work of breathing Gastrointestinal: soft, nontender, nondistended MSK: moves 4 extremities equally Derm: no rashes appreciated Neuro: CN II-XII grossly intact, normal strength in BLUE, BLLE Psych: AOx3, appropriate affect  Laboratory: Recent Labs  Lab 08/03/17 1325 08/03/17 1335  WBC 4.1  --   HGB 13.0 14.6  HCT 39.1 43.0  PLT 253  --    Recent Labs  Lab 08/03/17 1325 08/03/17 1335  NA 138 143  K 3.8 3.8  CL 107 107  CO2 24  --   BUN 18 21*  CREATININE 1.39* 1.30*  CALCIUM 8.7*  --   PROT 7.8  --   BILITOT 0.6  --   ALKPHOS 87  --   ALT 33  --   AST 39  --   GLUCOSE 113* 113*   Tot chol 202, HDL 62, LDL 114  Imaging/Diagnostic Tests: Ct Head Wo Contrast  Result Date: 08/03/2017 CLINICAL DATA:  Generalized weakness and inability to speak clearly for 1 week. Disoriented and confused. EXAM: CT HEAD WITHOUT CONTRAST TECHNIQUE: Contiguous axial images were obtained from the base of the skull  through the vertex without intravenous contrast. COMPARISON:  None. FINDINGS: Brain: Age-indeterminate small vessel ischemic disease of periventricular white matter. Remote appearing right basal ganglial infarct with mild ex vacuo dilatation of the adjacent right lateral ventricle. Age-indeterminate though  more recent appearing hypodense appearance of the left basal ganglia given slight positive mass effect suggested on the adjacent left lateral ventricle. No acute intracranial hemorrhage, large vascular territory infarct, midline shift or edema. Idiopathic basal ganglial calcifications are noted left-greater-than-right. Intact midline fourth ventricle and basal cisterns. No intra-axial mass nor extra-axial fluid collections. Vascular: No hyperdense vessel or unexpected calcification. Skull: Normal. Negative for fracture or focal lesion. Sinuses/Orbits: No acute finding. Other: None. IMPRESSION: 1. Age-indeterminate but likely chronic small vessel ischemic disease of periventricular white matter. 2. More recent appearing infarct of the left basal ganglia given what appears to be slight positive mass effect on the adjacent left lateral ventricle. MRI may help for better assessment. No hemorrhage noted. 3. More remote appearing right basal ganglial lacunar infarct with mild ex vacuo dilatation of the adjacent right lateral ventricle. Electronically Signed   By: Ashley Royalty M.D.   On: 08/03/2017 14:08   Mr Angiogram Head Wo Contrast  Result Date: 08/03/2017 CLINICAL DATA:  61 y/o M; disoriented with difficulty speaking for 2 days. Unsteady gait and facial droop. Mild weakness noted to the right arm. EXAM: MRI HEAD WITHOUT CONTRAST MRA HEAD WITHOUT CONTRAST TECHNIQUE: Multiplanar, multiecho pulse sequences of the brain and surrounding structures were obtained without intravenous contrast. Angiographic images of the head were obtained using MRA technique without contrast. COMPARISON:  08/03/2017 CT head. FINDINGS: MRI HEAD FINDINGS Brain: Reduced diffusion centered within the left posterior putamen extending into corona radiata and caudate body measuring 3.0 x 1.3 x 2.5 cm (volume = 5.1 cm^3). The infarct demonstrates associated T2 FLAIR hyperintense signal abnormality and mild local mass effect, likely late acute/  early subacute. Single punctate focus of susceptibility hypointensity within the left lentiform nucleus may represent an associated microhemorrhage. No additional evidence for intracranial hemorrhage. Small chronic lacunar infarct within the left pons and within the right anterior lentiform nucleus. Moderate chronic microvascular ischemic changes of white matter and parenchymal volume loss of the brain for age. No extra-axial collection, focal mass effect, or effacement of basilar cisterns. Vascular: As below. Skull and upper cervical spine: Normal marrow signal. Sinuses/Orbits: Negative. Other: None. MRA HEAD FINDINGS Internal carotid arteries:  Patent. Anterior cerebral arteries:  Patent. Middle cerebral arteries: Patent. Anterior communicating artery: Patent. Posterior communicating arteries:  Patent. Posterior cerebral arteries:  Patent.  Left fetal PCA. Basilar artery:  Patent. Vertebral arteries:  Patent. No evidence of high-grade stenosis, large vessel occlusion, or aneurysm identified. IMPRESSION: 1. Late acute/ Early subacute infarction centered left putamen, corona radiata, and caudate body measuring 5.1 cc. Single associated focus of microhemorrhage. Mild local mass effect. 2. No additional evidence for infarction, hemorrhage, mass effect, or herniation. 3. Moderate chronic microvascular ischemic changes and parenchymal volume loss of the brain. Chronic lacunar infarcts in left pons and right anterior basal ganglia. 4. Unremarkable MRA of the head. No large vessel occlusion, aneurysm, or significant stenosis is identified. These results were called by telephone at the time of interpretation on 08/03/2017 at 10:23 pm to [place], who verbally acknowledged these results. Electronically Signed   By: Kristine Garbe M.D.   On: 08/03/2017 22:26   Mr Brain Wo Contrast  Result Date: 08/03/2017 CLINICAL DATA:  61 y/o M; disoriented with difficulty speaking for 2  days. Unsteady gait and  facial droop. Mild weakness noted to the right arm. EXAM: MRI HEAD WITHOUT CONTRAST MRA HEAD WITHOUT CONTRAST TECHNIQUE: Multiplanar, multiecho pulse sequences of the brain and surrounding structures were obtained without intravenous contrast. Angiographic images of the head were obtained using MRA technique without contrast. COMPARISON:  08/03/2017 CT head. FINDINGS: MRI HEAD FINDINGS Brain: Reduced diffusion centered within the left posterior putamen extending into corona radiata and caudate body measuring 3.0 x 1.3 x 2.5 cm (volume = 5.1 cm^3). The infarct demonstrates associated T2 FLAIR hyperintense signal abnormality and mild local mass effect, likely late acute/ early subacute. Single punctate focus of susceptibility hypointensity within the left lentiform nucleus may represent an associated microhemorrhage. No additional evidence for intracranial hemorrhage. Small chronic lacunar infarct within the left pons and within the right anterior lentiform nucleus. Moderate chronic microvascular ischemic changes of white matter and parenchymal volume loss of the brain for age. No extra-axial collection, focal mass effect, or effacement of basilar cisterns. Vascular: As below. Skull and upper cervical spine: Normal marrow signal. Sinuses/Orbits: Negative. Other: None. MRA HEAD FINDINGS Internal carotid arteries:  Patent. Anterior cerebral arteries:  Patent. Middle cerebral arteries: Patent. Anterior communicating artery: Patent. Posterior communicating arteries:  Patent. Posterior cerebral arteries:  Patent.  Left fetal PCA. Basilar artery:  Patent. Vertebral arteries:  Patent. No evidence of high-grade stenosis, large vessel occlusion, or aneurysm identified. IMPRESSION: 1. Late acute/ Early subacute infarction centered left putamen, corona radiata, and caudate body measuring 5.1 cc. Single associated focus of microhemorrhage. Mild local mass effect. 2. No additional evidence for infarction, hemorrhage, mass effect,  or herniation. 3. Moderate chronic microvascular ischemic changes and parenchymal volume loss of the brain. Chronic lacunar infarcts in left pons and right anterior basal ganglia. 4. Unremarkable MRA of the head. No large vessel occlusion, aneurysm, or significant stenosis is identified. These results were called by telephone at the time of interpretation on 08/03/2017 at 10:23 pm to [place], who verbally acknowledged these results. Electronically Signed   By: Kristine Garbe M.D.   On: 08/03/2017 22:26    [place]sor Goeken, [place], DO 08/04/2017, 7:21 AM PGY-1, [place] Intern pager: [phone], text pages welcome

## 2017-08-04 NOTE — Care Management Note (Signed)
Case Management Note  Patient Details  Name: David [name] MRN: [id number] Date of Birth: [birth year]  Subjective/Objective:   Pt admitted with CVA. He is from home with friends.                  Action/Plan: Pt discharging home with [facility] services. CM provided him choice of [place] services and he selected [facility]. Dan with [facility] notified and accepted the referral. Pt states he is working on transportation home.   Expected Discharge Date:  08/05/17               Expected Discharge Plan:  [place]  In-House Referral:     Discharge planning Services  CM Consult  Post Acute Care Choice:  Home Health Choice offered to:  Patient  DME Arranged:    DME Agency:     HH Arranged:  PT [place]:  [place]  Status of Service:  Completed, signed off  If discussed at [place] of Stay Meetings, dates discussed:    Additional Comments:  Pollie Friar, RN 08/04/2017, 3:24 PM

## 2017-08-04 NOTE — Care Management Obs Status (Signed)
[place] NOTIFICATION   Patient Details  Name: David [name] MRN: [id number] Date of Birth: May 22, 1956   Medicare Observation Status Notification Given:  Yes    Pollie Friar, RN 08/04/2017, 3:18 PM

## 2017-08-04 NOTE — Progress Notes (Signed)
STROKE TEAM PROGRESS NOTE  Admission History: David [name] is an 61 y.o. male who presented to the ED with complaint of disorientation with difficulty speaking, unsteady gait, RUE weakness and facial droop. Symptoms began on Sunday with slurred speech, word-finding difficulty and trouble ambulating. He had difficulty answering questions in Triage and was noted to have stumbled to the right when he attempted to stand.  He admitted to cocaine use, last on Monday morning. PMHx includes HTN, HLD, prediabetes, arthritis and BPH. He denies a prior history of stroke. Home medications include ASA 81 mg qd.   SUBJECTIVE (INTERVAL HISTORY) No family is at the bedside. Patient is found laying in bed in NAD  Overall he feels his condition is unchanged. Poor eye contact and minimal speech output. Aware of Left arm weakness. Voices no new complaints. No new events reported overnight.  OBJECTIVE Lab Results: CBC:  Recent Labs  Lab 08/03/17 1325 08/03/17 1335  WBC 4.1  --   HGB 13.0 14.6  HCT 39.1 43.0  MCV 85.4  --   PLT 253  --    BMP: Recent Labs  Lab 08/03/17 1325 08/03/17 1335  NA 138 143  K 3.8 3.8  CL 107 107  CO2 24  --   GLUCOSE 113* 113*  BUN 18 21*  CREATININE 1.39* 1.30*  CALCIUM 8.7*  --    Liver Function Tests:  Recent Labs  Lab 08/03/17 1325  AST 39  ALT 33  ALKPHOS 87  BILITOT 0.6  PROT 7.8  ALBUMIN 3.6   Coagulation Studies:  Recent Labs    08/03/17 1325  APTT 28  INR 1.00   Urine Drug Screen:     Component Value Date/Time   LABOPIA NEG 08/02/2012 0921   COCAINSCRNUR POS (A) 08/02/2012 0921   LABBENZ NEG 08/02/2012 0921   AMPHETMU NEG 08/02/2012 0921    Alcohol Level: No results for input(s): ETH in the last 168 hours.  PHYSICAL EXAM Temp:  [98.2 F (36.8 C)-98.9 F (37.2 C)] 98.2 F (36.8 C) (12/05 0936) Pulse Rate:  [62-94] 74 (12/05 0936) Resp:  [16-24] 18 (12/05 0936) BP: (137-171)/(90-103) 137/90 (12/05 0936) SpO2:  [99 %-100 %] 100 %  (12/05 0936) Weight:  [72.6 kg (160 lb)] 72.6 kg (160 lb) (12/04 1329) General - Well nourished, well developed, in no apparent distress Respiratory - Lungs clear bilaterally. No wheezing. Cardiovascular - Regular rate and rhythm   Neurologic Examination: Mental Status: Awakens easily. Alert and fully oriented. Speech hypophonic with mild dysarthria. Flattened affect. Somewhat abulic. Repetition, naming and comprehension intact, except for an error with a 2-step directional command.  Cranial Nerves: II:  Visual fields intact. PERRL.  III,IV, VI: EOMI without nystagmus. No ptosis.  V,VII: Smile symmetric. Facial temp sensation normal bilaterally VIII: hearing intact to conversation IX,X: Palate rises symmetrically XI: Symmetric XII: Midline tongue extension  Motor: RUE: 4/5 proximal and distal LUE: 5/5 RLE: 5/5 LLE: 5/5 Normal tone throughout; no atrophy noted No pronator drift Sensory: Temp and light touch intact x 4. No extinction to DSS.  Deep Tendon Reflexes:  1+ upper and lower extremity reflexes.  Cerebellar: No ataxia with FNF bilaterally. No tremor.  Gait: Deferred due to falls risk concerns  IMAGING: I have personally reviewed the radiological images below and agree with the radiology interpretations. Dg Chest 2 View Result Date: 08/04/2017 IMPRESSION: No active cardiopulmonary disease.   Ct Head Wo Contrast Result Date: 08/03/2017 IMPRESSION: 1. Age-indeterminate but likely chronic small vessel ischemic disease  of periventricular white matter. 2. More recent appearing infarct of the left basal ganglia given what appears to be slight positive mass effect on the adjacent left lateral ventricle. MRI may help for better assessment. No hemorrhage noted. 3. More remote appearing right basal ganglial lacunar infarct with mild ex vacuo dilatation of the adjacent right lateral ventricle.   Mr David [name] Head Wo Contrast Result Date: 08/04/2017 IMPRESSION: No large or medium vessel  intracranial stenosis or regularity. No change since the prior exam.  MRI/MRA Brain Wo Contrast Result Date: 08/03/2017 IMPRESSION: 1. Late acute/ Early subacute infarction centered left putamen, corona radiata, and caudate body measuring 5.1 cc. Single associated focus of microhemorrhage. Mild local mass effect. 2. No additional evidence for infarction, hemorrhage, mass effect, or herniation. 3. Moderate chronic microvascular ischemic changes and parenchymal volume loss of the brain. Chronic lacunar infarcts in left pons and right anterior basal ganglia. 4. Unremarkable MRA of the head. No large vessel occlusion, aneurysm, or significant stenosis is identified.   Echocardiogram:                                              PENDING  B/L Carotid U/S:      Bilateral 1-39% ICA stenosis, antegrade vertebral flow                                    _____________________________________________________________________ ASSESSMENT: David [name] is a 61 y.o. male with PMH of HTN, HLD cocaine abuse and BPH who presented to the ED with complaint of disorientation, difficulty speaking, unsteady gait, RUE weakness and facial droop that began on Sunday   CT head obtained in the ED reveals age-indeterminate but likely chronic small vessel ischemic disease of periventricular white matter. There is a more recent appearing infarct of the left basal ganglia given what appears to be slight positive mass effect on the adjacent left lateral ventricle. Also noted is a more remote appearing right basal ganglial lacunar infarct with mild ex vacuo dilatation of the adjacent right lateral ventricle. Subsequent MRI reveals the following: 1. Late acute/Early subacute infarction centered left putamen, corona radiata, and caudate body measuring 5.1 cc. Single associated focus of microhemorrhage. Mild local mass effect. 2. No additional evidence for infarction, hemorrhage, mass effect, or herniation. 3. Moderate chronic  microvascular ischemic changes and parenchymal volume loss of the brain. Chronic lacunar infarcts in left pons and right anterior basal ganglia. MRA of the head is unremarkable, with no large vessel occlusion, aneurysm, or significant stenosis identified.  08/04/17: Neuro exam unchanged from Dr Yvetta Coder exam. Imaging results reviewed with patient and stroke risk factors. Poor eye contact. Minimal speech output and no questions asked. Carotid U/S - no acute findings. ECHO pending. Dispo pending.   Late acute/ Early subacute infarction centered left putamen, corona radiata, and caudate body measuring 5.1 cc Chronic lacunar infarcts in left pons and right anterior basal ganglia.  STROKE:  Suspected Etiology:Vasospasm secondary to sympathomimetic (cocaine) abuse with underlying atherosclerosis Resultant Symptoms: Mild dysarthria, Right Arm weakness Stroke Risk Factors: hyperlipidemia, hypertension and Cocaine Abuse Other Stroke Risk Factors: Advanced age, prediabetes  Outstanding Stroke Work-up Studies: ECHO  PLAN  08/04/2017: Continue Aspirin/ Statin Ongoing aggressive stroke risk factor management Patient counseled to be compliant with his antithrombotic medications and to stop cocaine  abuse Neurology follow up in 6 weeks  HYPERTENSION: Stable, some elevated B/P's noted overnight Permissive hypertension (OK if <220/120) for 24-48 hours post stroke and then gradually normalized within 5-7 days. Long term BP goal normotensive. May slowly start  B/P medications after 48 hours Home Meds: NONE  HYPERLIPIDEMIA:    Component Value Date/Time   CHOL 202 (H) 08/04/2017 0016   TRIG 130 08/04/2017 0016   HDL 62 08/04/2017 0016   CHOLHDL 3.3 08/04/2017 0016   VLDL 26 08/04/2017 0016   LDLCALC 114 (H) 08/04/2017 0016  Home Meds:  NONE LDL  goal < 70 Started on  Lipitor to 80 mg daily Continue statin at discharge  R/O DIABETES: Lab Results  Component Value Date   HGBA1C 6.2 (H)  08/04/2017  HgbA1c goal < 7.0  Cocaine ABUSE Encouraged to quit Reviewed high risk of stroke with continued abuse  Other Active Problems: Active Problems:   Slurred speech   Cerebral infarction [facility])  Hospital day # 1  VTE prophylaxis: Lovenox  Diet : Diet Carb Modified Fluid consistency: Thin; Room service appropriate? Yes   Prior Home Stroke Medications: No antithrombotic  Hospital -Current Stroke Medications:aspirin 325 mg daily and Lipitor 80 mg Discharge Stroke Meds: Please discharge patient on aspirin 325 mg daily and Lipitor 80 mg   Disposition: 01-Home or Self Care Therapy Recs:  PENDING Follow Recs:  Follow-up Information    Rosalin Hawking, MD. Schedule an appointment as soon as possible for a visit in 6 week(s).   Specialty:  Neurology Contact information: [address] [place] [address] [phone]          Kinnie Feil, MD-PCP in 1-2 weeks  FAMILY UPDATES: No family at bedside  TEAM UPDATES: Dr Clyde Lundborg Stroke Neurology Team 08/04/2017 12:59 PM  Attending Note: I reviewed above note and agree with the assessment and plan. I have made any additions or clarifications directly to the above note. Pt was seen and examined.  61 year old male with history of BPH, HTN, HLD and cocaine abuse admitted for speaking difficulty, right upper extremity weakness, right facial droop and gait difficulty.  CT showed old right caudate head and acute left BG infarct.  MRI confirmed left BG/CR acute infarct, old left pontine and right caudate head infarcts.  MRA negative, carotid Doppler negative, 2D echo pending.  A1c 6.2 and LDL 114.  UDS positive for cocaine and THC.  Patient was educated on cocaine and THC cessation.  Now on aspirin and Lipitor for stroke prevention.  Continue on discharge.  Still has mild right facial droop and right upper extremity weakness, pending PT OT.  Neurology will sign off. Please call with  questions. Pt will follow up with Cecille Rubin, NP, at [facility] in about 6 weeks. Thanks for the consult.  Rosalin Hawking, MD PhD Stroke Neurology 08/04/2017 4:31 PM  To contact Stroke Continuity provider, please refer to [email or web address] After hours, contact General Neurology

## 2017-08-04 NOTE — Progress Notes (Signed)
  Echocardiogram 2D Echocardiogram has been performed.  Rosaire Cueto G Jeremi Losito 08/04/2017, 2:06 PM

## 2017-08-04 NOTE — Care Management CC44 (Signed)
Condition Code 44 Documentation Completed  Patient Details  Name: David [name] MRN: [id number] Date of Birth: 1955-12-17   Condition Code 44 given:  Yes Patient signature on Condition Code 44 notice:  Yes Documentation of 2 MD's agreement:  Yes Code 44 added to claim:  Yes    Pollie Friar, RN 08/04/2017, 3:18 PM

## 2017-08-04 NOTE — Discharge Summary (Signed)
[facility] Discharge Summary  Patient name: David [name] Medical record number: [id number] Date of birth: Aug 06, 1956 Age: 61 y.o. Gender: male Date of Admission: 08/03/2017  Date of Discharge: 08/04/17  Admitting Physician: Alveda Reasons, MD  Primary Care Provider: Kinnie Feil, MD Consultants: Neurology  Indication for Hospitalization: AMS, slurred speech  Discharge Diagnoses/Problem List:  Left BG infarct HTN Prediabetes CKD-3 BPH Cocaine Abuse  Disposition: Home  Discharge Condition: Stable  Discharge Exam: see progress note from day of discharge  Brief Hospital Course:  David [name] is a 61 year old male with known hypertension, chronic substance abuse with mostly cocaine, and chronic pain who presented to the ED with slurred speech and aphasia status post stroke.  CT head was concerning for acute left basal ganglia and subacute/chronic right basal ganglia CVA.  Patient was seen and evaluated by neurology.  MRI showed left basal ganglia infarct.  Patient also had slight AK I upon admission which improved after IV fluids.  Neurology started patient on aspirin 325 mg and atorvastatin 80 mg daily.  Patient encouraged to stop abusing cocaine.  An ECHO was performed which showed an EF of 60-65% with grade 1 diastolic dysfunction.  Carotid Doppler was also performed which was not read prior to discharge.  Patient passed bedside swallow study.  Patient was evaluated by PT and OT and PT recommended home health, OT recommended no follow up.  Patient will need outpatient SLP evaluation.  Issues for Follow Up:  1. Patient may need to be seen by SLP outpatient for help with his slurred speech after stroke.  2. Patient needs HH PT, please ensure he is being seen. 3. Patient started on ASA 325 mg and atorvastatin 80 mg daily per neurology, ensure compliance.  4. Patient with admitted history of cocaine use, please encourage cessation. 5. Patient to  schedule neurology follow up. Please ensure he is following up.   Significant Procedures: none  Significant Labs and Imaging:  Recent Labs  Lab 08/03/17 1325 08/03/17 1335  WBC 4.1  --   HGB 13.0 14.6  HCT 39.1 43.0  PLT 253  --    Recent Labs  Lab 08/03/17 1325 08/03/17 1335  NA 138 143  K 3.8 3.8  CL 107 107  CO2 24  --   GLUCOSE 113* 113*  BUN 18 21*  CREATININE 1.39* 1.30*  CALCIUM 8.7*  --   ALKPHOS 87  --   AST 39  --   ALT 33  --   ALBUMIN 3.6  --     ECHO: Study Conclusions - Left ventricle: The cavity size was normal. Wall thickness was   normal. Systolic function was normal. The estimated ejection   fraction was in the range of 60% to 65%. Wall motion was normal;   there were no regional wall motion abnormalities. Doppler   parameters are consistent with abnormal left ventricular   relaxation (grade 1 diastolic dysfunction).  Dg Chest 2 View  Result Date: 08/04/2017 CLINICAL DATA:  Slurred speech EXAM: CHEST  2 VIEW COMPARISON:  None. FINDINGS: The heart size and mediastinal contours are within normal limits. Both lungs are clear. The visualized skeletal structures are unremarkable. Bilateral nipple shadows are noted. IMPRESSION: No active cardiopulmonary disease. Electronically Signed   By: Inez Catalina M.D.   On: 08/04/2017 09:35   Ct Head Wo Contrast  Result Date: 08/03/2017 CLINICAL DATA:  Generalized weakness and inability to speak clearly for 1 week. Disoriented and confused.  EXAM: CT HEAD WITHOUT CONTRAST TECHNIQUE: Contiguous axial images were obtained from the base of the skull through the vertex without intravenous contrast. COMPARISON:  None. FINDINGS: Brain: Age-indeterminate small vessel ischemic disease of periventricular white matter. Remote appearing right basal ganglial infarct with mild ex vacuo dilatation of the adjacent right lateral ventricle. Age-indeterminate though more recent appearing hypodense appearance of the left basal ganglia  given slight positive mass effect suggested on the adjacent left lateral ventricle. No acute intracranial hemorrhage, large vascular territory infarct, midline shift or edema. Idiopathic basal ganglial calcifications are noted left-greater-than-right. Intact midline fourth ventricle and basal cisterns. No intra-axial mass nor extra-axial fluid collections. Vascular: No hyperdense vessel or unexpected calcification. Skull: Normal. Negative for fracture or focal lesion. Sinuses/Orbits: No acute finding. Other: None. IMPRESSION: 1. Age-indeterminate but likely chronic small vessel ischemic disease of periventricular white matter. 2. More recent appearing infarct of the left basal ganglia given what appears to be slight positive mass effect on the adjacent left lateral ventricle. MRI may help for better assessment. No hemorrhage noted. 3. More remote appearing right basal ganglial lacunar infarct with mild ex vacuo dilatation of the adjacent right lateral ventricle. Electronically Signed   By: Ashley Royalty M.D.   On: 08/03/2017 14:08   Mr David [name] Head Wo Contrast  Result Date: 08/04/2017 CLINICAL DATA:  Followup slurred speech and acute left brain infarction. EXAM: MRA HEAD WITHOUT CONTRAST TECHNIQUE: Angiographic images of the Circle of Willis were obtained using MRA technique without intravenous contrast. COMPARISON:  MRI and MRA 08/03/2017 FINDINGS: Both internal carotid arteries are widely patent through the skullbase and siphon regions. Fetal origin of the left PCA. Both the anterior and middle cerebral vessels are patent without proximal stenosis, aneurysm or vascular malformation. Both vertebral arteries are widely patent to the basilar. No basilar stenosis. Posterior circulation branch vessels are normal. Fetal origin left PCA as noted above. IMPRESSION: No large or medium vessel intracranial stenosis or regularity. No change since the prior exam. Electronically Signed   By: Nelson Chimes M.D.   On: 08/04/2017  09:56   Mr Angiogram Head Wo Contrast  Result Date: 08/03/2017 CLINICAL DATA:  61 y/o M; disoriented with difficulty speaking for 2 days. Unsteady gait and facial droop. Mild weakness noted to the right arm. EXAM: MRI HEAD WITHOUT CONTRAST MRA HEAD WITHOUT CONTRAST TECHNIQUE: Multiplanar, multiecho pulse sequences of the brain and surrounding structures were obtained without intravenous contrast. Angiographic images of the head were obtained using MRA technique without contrast. COMPARISON:  08/03/2017 CT head. FINDINGS: MRI HEAD FINDINGS Brain: Reduced diffusion centered within the left posterior putamen extending into corona radiata and caudate body measuring 3.0 x 1.3 x 2.5 cm (volume = 5.1 cm^3). The infarct demonstrates associated T2 FLAIR hyperintense signal abnormality and mild local mass effect, likely late acute/ early subacute. Single punctate focus of susceptibility hypointensity within the left lentiform nucleus may represent an associated microhemorrhage. No additional evidence for intracranial hemorrhage. Small chronic lacunar infarct within the left pons and within the right anterior lentiform nucleus. Moderate chronic microvascular ischemic changes of white matter and parenchymal volume loss of the brain for age. No extra-axial collection, focal mass effect, or effacement of basilar cisterns. Vascular: As below. Skull and upper cervical spine: Normal marrow signal. Sinuses/Orbits: Negative. Other: None. MRA HEAD FINDINGS Internal carotid arteries:  Patent. Anterior cerebral arteries:  Patent. Middle cerebral arteries: Patent. Anterior communicating artery: Patent. Posterior communicating arteries:  Patent. Posterior cerebral arteries:  Patent.  Left fetal PCA. Basilar  artery:  Patent. Vertebral arteries:  Patent. No evidence of high-grade stenosis, large vessel occlusion, or aneurysm identified. IMPRESSION: 1. Late acute/ Early subacute infarction centered left putamen, corona radiata, and  caudate body measuring 5.1 cc. Single associated focus of microhemorrhage. Mild local mass effect. 2. No additional evidence for infarction, hemorrhage, mass effect, or herniation. 3. Moderate chronic microvascular ischemic changes and parenchymal volume loss of the brain. Chronic lacunar infarcts in left pons and right anterior basal ganglia. 4. Unremarkable MRA of the head. No large vessel occlusion, aneurysm, or significant stenosis is identified. These results were called by telephone at the time of interpretation on 08/03/2017 at 10:23 pm to [place], who verbally acknowledged these results. Electronically Signed   By: Kristine Garbe M.D.   On: 08/03/2017 22:26   Mr Brain Wo Contrast  Result Date: 08/03/2017 CLINICAL DATA:  61 y/o M; disoriented with difficulty speaking for 2 days. Unsteady gait and facial droop. Mild weakness noted to the right arm. EXAM: MRI HEAD WITHOUT CONTRAST MRA HEAD WITHOUT CONTRAST TECHNIQUE: Multiplanar, multiecho pulse sequences of the brain and surrounding structures were obtained without intravenous contrast. Angiographic images of the head were obtained using MRA technique without contrast. COMPARISON:  08/03/2017 CT head. FINDINGS: MRI HEAD FINDINGS Brain: Reduced diffusion centered within the left posterior putamen extending into corona radiata and caudate body measuring 3.0 x 1.3 x 2.5 cm (volume = 5.1 cm^3). The infarct demonstrates associated T2 FLAIR hyperintense signal abnormality and mild local mass effect, likely late acute/ early subacute. Single punctate focus of susceptibility hypointensity within the left lentiform nucleus may represent an associated microhemorrhage. No additional evidence for intracranial hemorrhage. Small chronic lacunar infarct within the left pons and within the right anterior lentiform nucleus. Moderate chronic microvascular ischemic changes of white matter and parenchymal volume loss of the brain for age. No extra-axial  collection, focal mass effect, or effacement of basilar cisterns. Vascular: As below. Skull and upper cervical spine: Normal marrow signal. Sinuses/Orbits: Negative. Other: None. MRA HEAD FINDINGS Internal carotid arteries:  Patent. Anterior cerebral arteries:  Patent. Middle cerebral arteries: Patent. Anterior communicating artery: Patent. Posterior communicating arteries:  Patent. Posterior cerebral arteries:  Patent.  Left fetal PCA. Basilar artery:  Patent. Vertebral arteries:  Patent. No evidence of high-grade stenosis, large vessel occlusion, or aneurysm identified. IMPRESSION: 1. Late acute/ Early subacute infarction centered left putamen, corona radiata, and caudate body measuring 5.1 cc. Single associated focus of microhemorrhage. Mild local mass effect. 2. No additional evidence for infarction, hemorrhage, mass effect, or herniation. 3. Moderate chronic microvascular ischemic changes and parenchymal volume loss of the brain. Chronic lacunar infarcts in left pons and right anterior basal ganglia. 4. Unremarkable MRA of the head. No large vessel occlusion, aneurysm, or significant stenosis is identified. These results were called by telephone at the time of interpretation on 08/03/2017 at 10:23 pm to [place], who verbally acknowledged these results. Electronically Signed   By: Kristine Garbe M.D.   On: 08/03/2017 22:26    Results/Tests Pending at Time of Discharge:  Carotid Doppler  Discharge Medications:  Allergies as of 08/04/2017   No Known Allergies     Medication List    STOP taking these medications   Aspirin 81 MG EC tablet Commonly known as:  ADULT ASPIRIN LOW STRENGTH Replaced by:  aspirin 325 MG EC tablet   diclofenac 75 MG EC tablet Commonly known as:  VOLTAREN     TAKE these medications   aspirin 325 MG  EC tablet Take 1 tablet (325 mg total) by mouth daily. Start taking on:  08/05/2017 Replaces:  Aspirin 81 MG EC tablet   atorvastatin 80 MG  tablet Commonly known as:  LIPITOR Take 1 tablet (80 mg total) by mouth daily at 6 PM.   tamsulosin 0.4 MG Caps capsule Commonly known as:  FLOMAX Take 1 capsule (0.4 mg total) by mouth daily.   traMADol 50 MG tablet Commonly known as:  ULTRAM Take 1 tablet (50 mg total) by mouth every 6 (six) hours as needed.       Discharge Instructions: Please refer to Patient Instructions section of EMR for full details.  Patient was counseled important signs and symptoms that should prompt return to medical care, changes in medications, dietary instructions, activity restrictions, and follow up appointments.   Follow-Up Appointments: Follow-up Information    Rosalin Hawking, MD. Schedule an appointment as soon as possible for a visit in 6 week(s).   Specialty:  Neurology Contact information: [address] [phone]           Chin Wachter, [place], [place] 08/04/2017, 4:13 PM PGY-1, [place]

## 2017-08-04 NOTE — Progress Notes (Signed)
*  PRELIMINARY RESULTS* Vascular Ultrasound Carotid Duplex (Doppler) has been completed.  Preliminary findings: Bilateral 1-39% ICA stenosis, antegrade vertebral flow.   Everrett Coombe 08/04/2017, 11:24 AM

## 2017-08-04 NOTE — Progress Notes (Signed)
SLP Cancellation Note  Patient Details Name: David [name] MRN: [id number] DOB: [birth year]   Cancelled treatment:       Reason Eval/Treat Not Completed: Other (comment) - Attempted to see pt for speech/language evaluation, but he appeared to be feigning sleep.  Will attempt again next date.    Juan Quam Laurice 08/04/2017, 12:20 PM

## 2017-08-04 NOTE — Evaluation (Signed)
Occupational Therapy Evaluation Patient Details Name: David [name] MRN: [id number] DOB: 04-20-1956 Today's Date: 08/04/2017    History of Present Illness 61 y.o. male who presented to the ED with complaint of disorientation with difficulty speaking, unsteady gait, RUE weakness and facial droop. MRI revealed Late acute/Early subacute infarction centered left putamen, corona radiata, and caudate body measuring 5.1 cc. Single associated focus of microhemorrhage. Mild local mass effect.   Clinical Impression   Pt reports he was independent with ADL PTA. Currently pt overall min guard assist for ADL and functional mobility. Pt with balance deficits and decreased R sided awareness during functional mobility. Pt planning to d/c home with intermittent supervision from friends. Pt would benefit from continued skilled OT to address established goals.    Follow Up Recommendations  No OT follow up;Supervision - Intermittent    Equipment Recommendations  3 in 1 bedside commode(for use as shower chair)    Recommendations for Other Services       Precautions / Restrictions Precautions Precautions: None Restrictions Weight Bearing Restrictions: No      Mobility Bed Mobility              General bed mobility comments: Pt OOB in chair upon arrival  Transfers Overall transfer level: Needs assistance Equipment used: None Transfers: Sit to/from Stand Sit to Stand: Min guard         General transfer comment: for safety, no physical assist required    Balance Overall balance assessment: Needs assistance Sitting-balance support: Feet supported;No upper extremity supported Sitting balance-Leahy Scale: Good     Standing balance support: No upper extremity supported;During functional activity Standing balance-Leahy Scale: Fair                          ADL either performed or assessed with clinical judgement   ADL Overall ADL's : Needs assistance/impaired Eating/Feeding:  Set up;Sitting   Grooming: Min guard;Standing   Upper Body Bathing: Set up;Supervision/ safety;Sitting   Lower Body Bathing: Min guard;Sit to/from stand   Upper Body Dressing : Set up;Supervision/safety;Sitting   Lower Body Dressing: Min guard;Sit to/from stand   Toilet Transfer: Min guard;Ambulation;Regular Toilet       Tub/ Banker: Min guard;Tub transfer;Ambulation;3 in 1 Tub/Shower Transfer Details (indicate cue type and reason): Discussed use of 3 in 1 as a seat for safety during bathing due to balance deficits; pt agreeable Functional mobility during ADLs: Min guard       Vision Baseline Vision/History: Wears glasses Wears Glasses: Distance only Patient Visual Report: No change from baseline Additional Comments: Pt running into objects on R side occasionally     Perception     Praxis      Pertinent Vitals/Pain Pain Assessment: No/denies pain     Hand Dominance Right   Extremity/Trunk Assessment Upper Extremity Assessment Upper Extremity Assessment: Overall WFL for tasks assessed   Lower Extremity Assessment Lower Extremity Assessment: Defer to PT evaluation   Cervical / Trunk Assessment Cervical / Trunk Assessment: Normal   Communication Communication Communication: No difficulties   Cognition Arousal/Alertness: Awake/alert Behavior During Therapy: WFL for tasks assessed/performed;Flat affect Overall Cognitive Status: No family/caregiver present to determine baseline cognitive functioning                                     General Comments     Exercises     Shoulder  Instructions      Home Living Family/patient expects to be discharged to:: Private residence Living Arrangements: Non-relatives/Friends Available Help at Discharge: Friend(s);Available PRN/intermittently Type of Home: House Home Access: Stairs to enter [employer] of Steps: 1   Home Layout: Two level;Bed/bath upstairs Alternate Level  Stairs-Number of Steps: 12   Bathroom Shower/Tub: Tub/shower unit;Curtain   Biochemist, clinical: Standard Bathroom Accessibility: Yes   Home Equipment: None          Prior Functioning/Environment Level of Independence: Independent        Comments: no difficulties with ambulation and independent with ADLs and iADLs        OT Problem List: Impaired balance (sitting and/or standing);Impaired vision/perception;Decreased knowledge of use of DME or AE      OT Treatment/Interventions: Self-care/ADL training;Energy conservation;DME and/or AE instruction;Therapeutic activities;Patient/family education;Balance training    OT Goals(Current goals can be found in the care plan section) Acute Rehab OT Goals Patient Stated Goal: go home OT Goal Formulation: With patient Time For Goal Achievement: 08/18/17 Potential to Achieve Goals: Good ADL Goals Additional ADL Goal #1: Pt will gather ADL items and perform ADL with mod I. Additional ADL Goal #2: Pt will demonstrate improved attention to R side during functional mobility.  OT Frequency: Min 2X/week   Barriers to D/C:            Co-evaluation              AM-PAC PT "6 Clicks" Daily Activity     Outcome Measure Help from another person eating meals?: None Help from another person taking care of personal grooming?: A Little Help from another person toileting, which includes using toliet, bedpan, or urinal?: A Little Help from another person bathing (including washing, rinsing, drying)?: A Little Help from another person to put on and taking off regular upper body clothing?: None Help from another person to put on and taking off regular lower body clothing?: A Little 6 Click Score: 20   End of Session Equipment Utilized During Treatment: Gait belt  Activity Tolerance: Patient tolerated treatment well Patient left: in chair;with call bell/phone within reach;with chair alarm set  OT Visit Diagnosis: Unsteadiness on feet  (R26.81)                Time: 7353-2992 OT Time Calculation (min): 12 min Charges:  OT General Charges $OT Visit: 1 Visit OT Evaluation $OT Eval Low Complexity: 1 Low G-Codes: OT G-codes **NOT FOR INPATIENT CLASS** Functional Assessment Tool Used: Clinical judgement Functional Limitation: Self care Self Care Current Status (E2683): At least 1 percent but less than 20 percent impaired, limited or restricted Self Care Goal Status (M1962): 0 percent impaired, limited or restricted   Mel Almond A. Ulice Brilliant, M.S., OTR/L Pager: [place] 08/04/2017, 5:12 PM

## 2017-08-04 NOTE — Evaluation (Addendum)
Physical Therapy Evaluation Patient Details Name: David [name] MRN: [id number] DOB: [birth year] Today's Date: 08/04/2017   History of Present Illness  61 y.o. male who presented to the ED with complaint of disorientation with difficulty speaking, unsteady gait, RUE weakness and facial droop. MRI revealed Late acute/Early subacute infarction centered left putamen, corona radiata, and caudate body measuring 5.1 cc. Single associated focus of microhemorrhage. Mild local mass effect.  Clinical Impression  PTA pt reports being independent with mobility as well as ADLs and iADLs. Pt is currently limited in his safe mobility by decreased R sided weakness, decreased balance and associated coordination deficits. Pt is currently mod I for bed mobility, and minA for steadying with transfers and ambulation. Recommending supervision for mobility and HHPT to address balance and R-sided strength. PT will continue to follow pt acutely.       Follow Up Recommendations Home health PT;Supervision for mobility/OOB    Equipment Recommendations  None recommended by PT    Recommendations for Other Services       Precautions / Restrictions Precautions Precautions: None Restrictions Weight Bearing Restrictions: No      Mobility  Bed Mobility Overal bed mobility: Modified Independent             General bed mobility comments: increased time and effort to come to EoB  Transfers Overall transfer level: Needs assistance Equipment used: Rolling walker (2 wheeled) Transfers: Sit to/from Stand Sit to Stand: Min guard;Min assist         General transfer comment: minA for power up and steadying in standing   Ambulation/Gait Ambulation/Gait assistance: Min assist Ambulation Distance (Feet): 175 Feet Assistive device: Rolling walker (2 wheeled) Gait Pattern/deviations: Step-through pattern;Narrow base of support Gait velocity: WFL Gait velocity interpretation: >2.62 ft/sec, indicative of  independent community ambulator General Gait Details: hands on min guard for safety, vc for increased widening stance for increased stability, 1xLoB with R foot misstep requiring minA for steadying   Modified Rankin (Stroke Patients Only) Modified Rankin (Stroke Patients Only) Pre-Morbid Rankin Score: No symptoms Modified Rankin: Moderate disability     Balance Overall balance assessment: Needs assistance Sitting-balance support: Feet supported;No upper extremity supported Sitting balance-Leahy Scale: Good     Standing balance support: No upper extremity supported;During functional activity Standing balance-Leahy Scale: Fair               High level balance activites: Head turns High Level Balance Comments: able to look L, R up, and down without LoB or change in gait speed             Pertinent Vitals/Pain Pain Assessment: No/denies pain    Home Living Family/patient expects to be discharged to:: Private residence Living Arrangements: Non-relatives/Friends Available Help at Discharge: Friend(s);Available PRN/intermittently Type of Home: House Home Access: Stairs to enter   Entrance Stairs-Number of Steps: 1 Home Layout: Two level Home Equipment: None      Prior Function Level of Independence: Independent         Comments: no difficulties with ambulation and independent with ADLs and iADLs     Hand Dominance   Dominant Hand: Right    Extremity/Trunk Assessment   Upper Extremity Assessment Upper Extremity Assessment: Defer to OT evaluation    Lower Extremity Assessment Lower Extremity Assessment: RLE deficits/detail RLE Deficits / Details: strength grossly assessed to be: hip flexion 4/5, knee ext 5/5, knee flexion 4/5 dorsiflexion 4/5, plantarflexion 5/5  RLE Coordination: decreased gross motor(with ambulation )  Communication   Communication: No difficulties  Cognition Arousal/Alertness: Awake/alert Behavior During Therapy: WFL for  tasks assessed/performed;Flat affect Overall Cognitive Status: No family/caregiver present to determine baseline cognitive functioning                                        General Comments General comments (skin integrity, edema, etc.): Pt noted to have some R sided inattention especially with R UE and with clearing obstacles on his R side when walking.         Assessment/Plan    PT Assessment Patient needs continued PT services  PT Problem List Decreased strength;Decreased balance;Decreased mobility;Decreased safety awareness       PT Treatment Interventions Gait training;Stair training;Functional mobility training;Therapeutic activities;Therapeutic exercise;Balance training;Neuromuscular re-education;Patient/family education    PT Goals (Current goals can be found in the Care Plan section)  Acute Rehab PT Goals Patient Stated Goal: go home PT Goal Formulation: With patient Time For Goal Achievement: 08/18/17 Potential to Achieve Goals: Fair    Frequency Min 4X/week   Barriers to discharge Decreased caregiver support      Co-evaluation               AM-PAC PT "6 Clicks" Daily Activity  Outcome Measure Difficulty turning over in bed (including adjusting bedclothes, sheets and blankets)?: A Little Difficulty moving from lying on back to sitting on the side of the bed? : A Lot Difficulty sitting down on and standing up from a chair with arms (e.g., wheelchair, bedside commode, etc,.)?: Unable Help needed moving to and from a bed to chair (including a wheelchair)?: A Little Help needed walking in hospital room?: A Little Help needed climbing 3-5 steps with a railing? : A Lot 6 Click Score: 14    End of Session Equipment Utilized During Treatment: Gait belt Activity Tolerance: Patient tolerated treatment well Patient left: in chair;with call bell/phone within reach;with chair alarm set Nurse Communication: Mobility status PT Visit Diagnosis:  Unsteadiness on feet (R26.81);Other abnormalities of gait and mobility (R26.89);Difficulty in walking, not elsewhere classified (R26.2);Hemiplegia and hemiparesis Hemiplegia - Right/Left: Right Hemiplegia - dominant/non-dominant: Dominant Hemiplegia - caused by: Cerebral infarction    Time: 1420-1445 PT Time Calculation (min) (ACUTE ONLY): 25 min   Charges:   PT Evaluation $PT Eval Moderate Complexity: 1 Mod PT Treatments $Gait Training: 8-22 mins   PT G Codes:   PT G-Codes **NOT FOR INPATIENT CLASS** Functional Assessment Tool Used: AM-PAC 6 Clicks Basic Mobility Functional Limitation: Mobility: Walking and moving around Mobility: Walking and Moving Around Current Status (M7680): At least 40 percent but less than 60 percent impaired, limited or restricted Mobility: Walking and Moving Around Goal Status [phone]): At least 1 percent but less than 20 percent impaired, limited or restricted    Benjamine Mola B. Migdalia Dk PT, DPT Acute Rehabilitation  [phone] Pager [phone]    [place] 08/04/2017, 4:41 PM

## 2017-08-04 NOTE — Consult Note (Signed)
Referring Physician: Dr. Cyndia Skeeters    Chief Complaint: Disorientation with difficulty speaking, unsteady gait, RUE weakness and facial droop  HPI: David [name] is an 61 y.o. male who presented to the ED with complaint of disorientation with difficulty speaking, unsteady gait, RUE weakness and facial droop. Symptoms began on Sunday with slurred speech, word-finding difficulty and trouble ambulating. He had difficulty answering questions in Triage and was noted to have stumbled to the right when he attempted to stand.   He admitted to cocaine use, last on Monday morning.   PMHx includes HTN, HLD, prediabetes, arthritis and BPH. He denies a prior history of stroke. Home medications include ASA 81 mg qd.   CT head obtained in the ED reveals age-indeterminate but likely chronic small vessel ischemic disease of periventricular white matter. There is a more recent appearing infarct of the left basal ganglia given what appears to be slight positive mass effect on the adjacent left lateral ventricle. Also noted is a more remote appearing right basal ganglial lacunar infarct with mild ex vacuo dilatation of the adjacent right lateral ventricle.  Subsequent MRI reveals the following: 1. Late acute/Early subacute infarction centered left putamen, corona radiata, and caudate body measuring 5.1 cc. Single associated focus of microhemorrhage. Mild local mass effect. 2. No additional evidence for infarction, hemorrhage, mass effect, or herniation. 3. Moderate chronic microvascular ischemic changes and parenchymal volume loss of the brain. Chronic lacunar infarcts in left pons and right anterior basal ganglia.  MRA of the head is unremarkable, with no large vessel occlusion, aneurysm, or significant stenosis identified.  Past Medical History:  Diagnosis Date  . Benign prostatic hyperplasia     Past Surgical History:  Procedure Laterality Date  . SHOULDER ARTHROSCOPY W/ ROTATOR CUFF REPAIR  10/21/10   At [place], R  shoulder    History reviewed. No pertinent family history. Social History:  reports that  has never smoked. he has never used smokeless tobacco. He reports that he does not drink alcohol or use drugs.  Allergies: No Known Allergies  Medications:  Prior to Admission:  Medications Prior to Admission  Medication Sig Dispense Refill Last Dose  . diclofenac (VOLTAREN) 75 MG EC tablet Take 1 tablet (75 mg total) by mouth 2 (two) times daily as needed. (Patient taking differently: Take 75 mg by mouth 2 (two) times daily as needed for mild pain. ) 60 tablet 3 prn  . tamsulosin (FLOMAX) 0.4 MG CAPS capsule Take 1 capsule (0.4 mg total) by mouth daily. 90 capsule 2 08/02/2017 at Unknown time  . Aspirin (ADULT ASPIRIN LOW STRENGTH) 81 MG EC tablet Take 1 tablet (81 mg total) by mouth daily. (Patient not taking: Reported on 11/06/2016) 90 tablet 3 Not Taking at Unknown time  . traMADol (ULTRAM) 50 MG tablet Take 1 tablet (50 mg total) by mouth every 6 (six) hours as needed. (Patient not taking: Reported on 11/06/2016) 15 tablet 0 Not Taking at Unknown time   Scheduled: .  stroke: mapping our early stages of recovery book   Does not apply Once  . atorvastatin  80 mg Oral q1800  . Influenza vac split quadrivalent PF  0.5 mL Intramuscular Tomorrow-1000  . insulin aspart  0-5 Units Subcutaneous QHS  . insulin aspart  0-9 Units Subcutaneous TID WC   Continuous: . sodium chloride      ROS: No blurred vision, chest pain, dysphagia, dizziness or SOB.   Physical Examination: Blood pressure (!) 150/94, pulse 62, temperature 98.4 F (36.9 C),  temperature source Oral, resp. rate 18, height 5' 10"  (1.778 m), weight 72.6 kg (160 lb), SpO2 100 %.  HEENT: [place]/AT Lungs: Respirations unlabored Ext: No edema  Neurologic Examination: Mental Status: Awakens easily. Alert and fully oriented. Speech hypophonic with mild dysarthria. Flattened affect. Somewhat abulic. Repetition, naming and comprehension intact, except  for an error with a 2-step directional command.  Cranial Nerves: II:  Visual fields intact. PERRL.  III,IV, VI: EOMI without nystagmus. No ptosis.  V,VII: Smile symmetric. Facial temp sensation normal bilaterally VIII: hearing intact to conversation IX,X: Palate rises symmetrically XI: Symmetric XII: Midline tongue extension  Motor: RUE: 4/5 proximal and distal LUE: 5/5 RLE: 5/5 LLE: 5/5 Normal tone throughout; no atrophy noted No pronator drift Sensory: Temp and light touch intact x 4. No extinction to DSS.  Deep Tendon Reflexes:  1+ upper and lower extremity reflexes.  Cerebellar: No ataxia with FNF bilaterally. No tremor.  Gait: Deferred due to falls risk concerns  Results for orders placed or performed during the hospital encounter of 08/03/17 (from the past 48 hour(s))  Protime-INR     Status: None   Collection Time: 08/03/17  1:25 PM  Result Value Ref Range   Prothrombin Time 13.1 11.4 - 15.2 seconds   INR 1.00   APTT     Status: None   Collection Time: 08/03/17  1:25 PM  Result Value Ref Range   aPTT 28 24 - 36 seconds  CBC     Status: None   Collection Time: 08/03/17  1:25 PM  Result Value Ref Range   WBC 4.1 4.0 - 10.5 K/uL   RBC 4.58 4.22 - 5.81 MIL/uL   Hemoglobin 13.0 13.0 - 17.0 g/dL   HCT 39.1 39.0 - 52.0 %   MCV 85.4 78.0 - 100.0 fL   MCH 28.4 26.0 - 34.0 pg   MCHC 33.2 30.0 - 36.0 g/dL   RDW 14.6 11.5 - 15.5 %   Platelets 253 150 - 400 K/uL  Differential     Status: Abnormal   Collection Time: 08/03/17  1:25 PM  Result Value Ref Range   Neutrophils Relative % 37 %   Neutro Abs 1.5 (L) 1.7 - 7.7 K/uL   Lymphocytes Relative 54 %   Lymphs Abs 2.2 0.7 - 4.0 K/uL   Monocytes Relative 8 %   Monocytes Absolute 0.3 0.1 - 1.0 K/uL   Eosinophils Relative 2 %   Eosinophils Absolute 0.1 0.0 - 0.7 K/uL   Basophils Relative 1 %   Basophils Absolute 0.0 0.0 - 0.1 K/uL  Comprehensive metabolic panel     Status: Abnormal   Collection Time: 08/03/17  1:25 PM   Result Value Ref Range   Sodium 138 135 - 145 mmol/L   Potassium 3.8 3.5 - 5.1 mmol/L   Chloride 107 101 - 111 mmol/L   CO2 24 22 - 32 mmol/L   Glucose, Bld 113 (H) 65 - 99 mg/dL   BUN 18 6 - 20 mg/dL   Creatinine, Ser 1.39 (H) 0.61 - 1.24 mg/dL   Calcium 8.7 (L) 8.9 - 10.3 mg/dL   Total Protein 7.8 6.5 - 8.1 g/dL   Albumin 3.6 3.5 - 5.0 g/dL   AST 39 15 - 41 U/L   ALT 33 17 - 63 U/L   Alkaline Phosphatase 87 38 - 126 U/L   Total Bilirubin 0.6 0.3 - 1.2 mg/dL   GFR calc non Af Amer 53 (L) >60 mL/min   GFR calc Af Amer >  60 >60 mL/min    Comment: (NOTE) The eGFR has been calculated using the CKD EPI equation. This calculation has not been validated in all clinical situations. eGFR's persistently <60 mL/min signify possible Chronic Kidney Disease.    Anion gap 7 5 - 15  I-stat troponin, ED     Status: None   Collection Time: 08/03/17  1:34 PM  Result Value Ref Range   Troponin i, poc 0.00 0.00 - 0.08 ng/mL   Comment 3            Comment: Due to the release kinetics of cTnI, a negative result within the first hours of the onset of symptoms does not rule out myocardial infarction with certainty. If myocardial infarction is still suspected, repeat the test at appropriate intervals.   I-Stat Chem 8, ED     Status: Abnormal   Collection Time: 08/03/17  1:35 PM  Result Value Ref Range   Sodium 143 135 - 145 mmol/L   Potassium 3.8 3.5 - 5.1 mmol/L   Chloride 107 101 - 111 mmol/L   BUN 21 (H) 6 - 20 mg/dL   Creatinine, Ser 1.30 (H) 0.61 - 1.24 mg/dL   Glucose, Bld 113 (H) 65 - 99 mg/dL   Calcium, Ion 1.15 1.15 - 1.40 mmol/L   TCO2 25 22 - 32 mmol/L   Hemoglobin 14.6 13.0 - 17.0 g/dL   HCT 43.0 39.0 - 52.0 %  CBG monitoring, ED     Status: Abnormal   Collection Time: 08/03/17  7:24 PM  Result Value Ref Range   Glucose-Capillary 59 (L) 65 - 99 mg/dL   Comment 1 Notify RN    Comment 2 Document in Chart   CBG monitoring, ED     Status: Abnormal   Collection Time: 08/03/17  10:27 PM  Result Value Ref Range   Glucose-Capillary 207 (H) 65 - 99 mg/dL   Comment 1 Notify RN    Comment 2 Document in Chart   Hemoglobin A1c     Status: Abnormal   Collection Time: 08/04/17 12:16 AM  Result Value Ref Range   Hgb A1c MFr Bld 6.2 (H) 4.8 - 5.6 %    Comment: (NOTE) Pre diabetes:          5.7%-6.4% Diabetes:              >6.4% Glycemic control for   <7.0% adults with diabetes    Mean Plasma Glucose 131.24 mg/dL  Lipid panel     Status: Abnormal   Collection Time: 08/04/17 12:16 AM  Result Value Ref Range   Cholesterol 202 (H) 0 - 200 mg/dL   Triglycerides 130 <150 mg/dL   HDL 62 >40 mg/dL   Total CHOL/HDL Ratio 3.3 RATIO   VLDL 26 0 - 40 mg/dL   LDL Cholesterol 114 (H) 0 - 99 mg/dL    Comment:        Total Cholesterol/HDL:CHD Risk Coronary Heart Disease Risk Table                     Men   Women  1/2 Average Risk   3.4   3.3  Average Risk       5.0   4.4  2 X Average Risk   9.6   7.1  3 X Average Risk  23.4   11.0        Use the calculated Patient Ratio above and the CHD Risk Table to determine the patient's CHD Risk.  ATP III CLASSIFICATION (LDL):  <100     mg/dL   Optimal  100-129  mg/dL   Near or Above                    Optimal  130-159  mg/dL   Borderline  160-189  mg/dL   High  >190     mg/dL   Very High   Glucose, capillary     Status: None   Collection Time: 08/04/17 12:20 AM  Result Value Ref Range   Glucose-Capillary 91 65 - 99 mg/dL   Ct Head Wo Contrast  Result Date: 08/03/2017 CLINICAL DATA:  Generalized weakness and inability to speak clearly for 1 week. Disoriented and confused. EXAM: CT HEAD WITHOUT CONTRAST TECHNIQUE: Contiguous axial images were obtained from the base of the skull through the vertex without intravenous contrast. COMPARISON:  None. FINDINGS: Brain: Age-indeterminate small vessel ischemic disease of periventricular white matter. Remote appearing right basal ganglial infarct with mild ex vacuo dilatation of  the adjacent right lateral ventricle. Age-indeterminate though more recent appearing hypodense appearance of the left basal ganglia given slight positive mass effect suggested on the adjacent left lateral ventricle. No acute intracranial hemorrhage, large vascular territory infarct, midline shift or edema. Idiopathic basal ganglial calcifications are noted left-greater-than-right. Intact midline fourth ventricle and basal cisterns. No intra-axial mass nor extra-axial fluid collections. Vascular: No hyperdense vessel or unexpected calcification. Skull: Normal. Negative for fracture or focal lesion. Sinuses/Orbits: No acute finding. Other: None. IMPRESSION: 1. Age-indeterminate but likely chronic small vessel ischemic disease of periventricular white matter. 2. More recent appearing infarct of the left basal ganglia given what appears to be slight positive mass effect on the adjacent left lateral ventricle. MRI may help for better assessment. No hemorrhage noted. 3. More remote appearing right basal ganglial lacunar infarct with mild ex vacuo dilatation of the adjacent right lateral ventricle. Electronically Signed   By: Ashley Royalty M.D.   On: 08/03/2017 14:08   Mr Angiogram Head Wo Contrast  Result Date: 08/03/2017 CLINICAL DATA:  61 y/o M; disoriented with difficulty speaking for 2 days. Unsteady gait and facial droop. Mild weakness noted to the right arm. EXAM: MRI HEAD WITHOUT CONTRAST MRA HEAD WITHOUT CONTRAST TECHNIQUE: Multiplanar, multiecho pulse sequences of the brain and surrounding structures were obtained without intravenous contrast. Angiographic images of the head were obtained using MRA technique without contrast. COMPARISON:  08/03/2017 CT head. FINDINGS: MRI HEAD FINDINGS Brain: Reduced diffusion centered within the left posterior putamen extending into corona radiata and caudate body measuring 3.0 x 1.3 x 2.5 cm (volume = 5.1 cm^3). The infarct demonstrates associated T2 FLAIR hyperintense signal  abnormality and mild local mass effect, likely late acute/ early subacute. Single punctate focus of susceptibility hypointensity within the left lentiform nucleus may represent an associated microhemorrhage. No additional evidence for intracranial hemorrhage. Small chronic lacunar infarct within the left pons and within the right anterior lentiform nucleus. Moderate chronic microvascular ischemic changes of white matter and parenchymal volume loss of the brain for age. No extra-axial collection, focal mass effect, or effacement of basilar cisterns. Vascular: As below. Skull and upper cervical spine: Normal marrow signal. Sinuses/Orbits: Negative. Other: None. MRA HEAD FINDINGS Internal carotid arteries:  Patent. Anterior cerebral arteries:  Patent. Middle cerebral arteries: Patent. Anterior communicating artery: Patent. Posterior communicating arteries:  Patent. Posterior cerebral arteries:  Patent.  Left fetal PCA. Basilar artery:  Patent. Vertebral arteries:  Patent. No evidence of high-grade stenosis, large vessel occlusion, or  aneurysm identified. IMPRESSION: 1. Late acute/ Early subacute infarction centered left putamen, corona radiata, and caudate body measuring 5.1 cc. Single associated focus of microhemorrhage. Mild local mass effect. 2. No additional evidence for infarction, hemorrhage, mass effect, or herniation. 3. Moderate chronic microvascular ischemic changes and parenchymal volume loss of the brain. Chronic lacunar infarcts in left pons and right anterior basal ganglia. 4. Unremarkable MRA of the head. No large vessel occlusion, aneurysm, or significant stenosis is identified. These results were called by telephone at the time of interpretation on 08/03/2017 at 10:23 pm to [place], who verbally acknowledged these results. Electronically Signed   By: Kristine Garbe M.D.   On: 08/03/2017 22:26   Mr Brain Wo Contrast  Result Date: 08/03/2017 CLINICAL DATA:  61 y/o M;  disoriented with difficulty speaking for 2 days. Unsteady gait and facial droop. Mild weakness noted to the right arm. EXAM: MRI HEAD WITHOUT CONTRAST MRA HEAD WITHOUT CONTRAST TECHNIQUE: Multiplanar, multiecho pulse sequences of the brain and surrounding structures were obtained without intravenous contrast. Angiographic images of the head were obtained using MRA technique without contrast. COMPARISON:  08/03/2017 CT head. FINDINGS: MRI HEAD FINDINGS Brain: Reduced diffusion centered within the left posterior putamen extending into corona radiata and caudate body measuring 3.0 x 1.3 x 2.5 cm (volume = 5.1 cm^3). The infarct demonstrates associated T2 FLAIR hyperintense signal abnormality and mild local mass effect, likely late acute/ early subacute. Single punctate focus of susceptibility hypointensity within the left lentiform nucleus may represent an associated microhemorrhage. No additional evidence for intracranial hemorrhage. Small chronic lacunar infarct within the left pons and within the right anterior lentiform nucleus. Moderate chronic microvascular ischemic changes of white matter and parenchymal volume loss of the brain for age. No extra-axial collection, focal mass effect, or effacement of basilar cisterns. Vascular: As below. Skull and upper cervical spine: Normal marrow signal. Sinuses/Orbits: Negative. Other: None. MRA HEAD FINDINGS Internal carotid arteries:  Patent. Anterior cerebral arteries:  Patent. Middle cerebral arteries: Patent. Anterior communicating artery: Patent. Posterior communicating arteries:  Patent. Posterior cerebral arteries:  Patent.  Left fetal PCA. Basilar artery:  Patent. Vertebral arteries:  Patent. No evidence of high-grade stenosis, large vessel occlusion, or aneurysm identified. IMPRESSION: 1. Late acute/ Early subacute infarction centered left putamen, corona radiata, and caudate body measuring 5.1 cc. Single associated focus of microhemorrhage. Mild local mass effect.  2. No additional evidence for infarction, hemorrhage, mass effect, or herniation. 3. Moderate chronic microvascular ischemic changes and parenchymal volume loss of the brain. Chronic lacunar infarcts in left pons and right anterior basal ganglia. 4. Unremarkable MRA of the head. No large vessel occlusion, aneurysm, or significant stenosis is identified. These results were called by telephone at the time of interpretation on 08/03/2017 at 10:23 pm to [place], who verbally acknowledged these results. Electronically Signed   By: Kristine Garbe M.D.   On: 08/03/2017 22:26    Assessment: 61 y.o. male with acute left basal ganglia stroke. Admits to using cocaine on Monday morning.  1. Vasospasm secondary to sympathomimetic (cocaine) abuse is highest on the DDx regarding the underlying etiology for the patient's stroke. May also have underlying atherosclerosis. Cardioembolic stroke possible but relatively low on the DDx.  2. Stroke Risk Factors - HTN, HLD, prediabetes and cocaine use  Plan: 1. HgbA1c, fasting lipid panel 2. Carotid ultrasound 3. TTE  4. Cardiac telemetry 5. BP management 6. Continue ASA 81 mg po qd 7. Atorvastatin 8. Counseling regarding cessation of cocaine.  I have explained to the patient the likely mechanisms resulting in stroke (malignant HTN and vasospasm)  in the setting of cocaine use. This may need to be repeated to him for emphasis.  9. PT consult, OT consult, Speech consult 10. Frequent neuro checks  @Electronically  signed: Dr. Kerney Elbe  08/04/2017, 4:47 AM

## 2017-08-04 NOTE — Discharge Instructions (Signed)
It was a pleasure caring for you while you were admitted to the hospital! You were admitted after having a stroke. You will be sent home on aspirin 325 mg daily and atorvastatin 80 mg. It is important to take these medications to prevent future strokes. It is also important that your stop using cocaine.

## 2017-08-10 ENCOUNTER — Inpatient Hospital Stay: Payer: Medicare HMO | Admitting: Family Medicine

## 2017-08-13 ENCOUNTER — Telehealth: Payer: Self-pay | Admitting: *Deleted

## 2017-08-13 ENCOUNTER — Telehealth: Payer: Self-pay | Admitting: Family Medicine

## 2017-08-13 DIAGNOSIS — R52 Pain, unspecified: Secondary | ICD-10-CM | POA: Diagnosis not present

## 2017-08-13 DIAGNOSIS — I69398 Other sequelae of cerebral infarction: Secondary | ICD-10-CM | POA: Diagnosis not present

## 2017-08-13 DIAGNOSIS — N400 Enlarged prostate without lower urinary tract symptoms: Secondary | ICD-10-CM | POA: Diagnosis not present

## 2017-08-13 DIAGNOSIS — N183 Chronic kidney disease, stage 3 (moderate): Secondary | ICD-10-CM | POA: Diagnosis not present

## 2017-08-13 DIAGNOSIS — I69322 Dysarthria following cerebral infarction: Secondary | ICD-10-CM | POA: Diagnosis not present

## 2017-08-13 DIAGNOSIS — I129 Hypertensive chronic kidney disease with stage 1 through stage 4 chronic kidney disease, or unspecified chronic kidney disease: Secondary | ICD-10-CM | POA: Diagnosis not present

## 2017-08-13 DIAGNOSIS — R7303 Prediabetes: Secondary | ICD-10-CM | POA: Diagnosis not present

## 2017-08-13 DIAGNOSIS — R2689 Other abnormalities of gait and mobility: Secondary | ICD-10-CM | POA: Diagnosis not present

## 2017-08-13 DIAGNOSIS — I69320 Aphasia following cerebral infarction: Secondary | ICD-10-CM | POA: Diagnosis not present

## 2017-08-13 DIAGNOSIS — E785 Hyperlipidemia, unspecified: Secondary | ICD-10-CM | POA: Diagnosis not present

## 2017-08-13 NOTE — Telephone Encounter (Signed)
Please help him schedule follow-u appointment

## 2017-08-13 NOTE — Telephone Encounter (Signed)
David [name] with [facility] called and said they been trying to reach this patient all week and have left messages and still have not heard back from him. They are not going to be doing the services for him now. The only way they will do them is if the pt comes back into the office to be seen and gets more orders.

## 2017-08-13 NOTE — Telephone Encounter (Signed)
Ryan with [facility] is aware. Jazmin Hartsell,CMA

## 2017-08-13 NOTE — Telephone Encounter (Signed)
Ryan from [facility] went out today, he is requesting verbal orders for the following:  - PT for Balance 1per week x 1 week, then 2x week for 2 weeks  - Order for Speech therapy to assess aphasia  Bradly Sangiovanni, Salome Spotted, CMA

## 2017-08-13 NOTE — Telephone Encounter (Signed)
I agree with request. Please let them know that verbal order has been approved.

## 2017-08-13 NOTE — Telephone Encounter (Signed)
Will forward to MD. Milah Recht,CMA  

## 2017-08-16 NOTE — Telephone Encounter (Signed)
Tried to call patient but was unable to reach him.  Home number was a business and no one answered cell and no VM available.  Will continue to try and reach him. Miki Blank,CMA

## 2017-08-17 NOTE — Telephone Encounter (Signed)
LM for patient to call back to schedule an appointment to follow up on his blood pressure and also his prostate.  Jazmin Hartsell,CMA

## 2017-08-18 ENCOUNTER — Telehealth: Payer: Self-pay | Admitting: Family Medicine

## 2017-08-18 NOTE — Telephone Encounter (Signed)
Will forward to MD to let her know.  I haven't been able to reach patient either.  I did mail him a letter yesterday asking him to call our office to schedule a follow up appt. Jazmin Hartsell,CMA

## 2017-08-18 NOTE — Telephone Encounter (Signed)
Jennie with [facility] wanted to let PCP know they have only been able to do PT with this pt one time on 12/14. They have not been able to get in touch with him since. She will continue trying to get in touch with him for now. If any questions Patrici Ranks can be called at [phone].

## 2017-08-19 ENCOUNTER — Telehealth: Payer: Self-pay | Admitting: *Deleted

## 2017-08-19 DIAGNOSIS — R52 Pain, unspecified: Secondary | ICD-10-CM | POA: Diagnosis not present

## 2017-08-19 DIAGNOSIS — R2689 Other abnormalities of gait and mobility: Secondary | ICD-10-CM | POA: Diagnosis not present

## 2017-08-19 DIAGNOSIS — I69320 Aphasia following cerebral infarction: Secondary | ICD-10-CM | POA: Diagnosis not present

## 2017-08-19 DIAGNOSIS — I69398 Other sequelae of cerebral infarction: Secondary | ICD-10-CM | POA: Diagnosis not present

## 2017-08-19 DIAGNOSIS — I129 Hypertensive chronic kidney disease with stage 1 through stage 4 chronic kidney disease, or unspecified chronic kidney disease: Secondary | ICD-10-CM | POA: Diagnosis not present

## 2017-08-19 DIAGNOSIS — I69322 Dysarthria following cerebral infarction: Secondary | ICD-10-CM | POA: Diagnosis not present

## 2017-08-19 DIAGNOSIS — E785 Hyperlipidemia, unspecified: Secondary | ICD-10-CM | POA: Diagnosis not present

## 2017-08-19 DIAGNOSIS — N183 Chronic kidney disease, stage 3 (moderate): Secondary | ICD-10-CM | POA: Diagnosis not present

## 2017-08-19 DIAGNOSIS — R7303 Prediabetes: Secondary | ICD-10-CM | POA: Diagnosis not present

## 2017-08-19 DIAGNOSIS — N400 Enlarged prostate without lower urinary tract symptoms: Secondary | ICD-10-CM | POA: Diagnosis not present

## 2017-08-19 NOTE — Telephone Encounter (Signed)
Verbal order approved, please let Sonia Baller know. Thanks

## 2017-08-19 NOTE — Telephone Encounter (Signed)
Received message on nurse line from [place] with [facility] requesting VO for Speech Therapy once weekly for 3 weeks. Please call Sonia Baller at [phone]. Hubbard Hartshorn, RN, BSN

## 2017-08-19 NOTE — Telephone Encounter (Signed)
Verbal ok given to [place]. Elior Robinette,CMA

## 2017-08-25 DIAGNOSIS — I69398 Other sequelae of cerebral infarction: Secondary | ICD-10-CM | POA: Diagnosis not present

## 2017-08-25 DIAGNOSIS — I69320 Aphasia following cerebral infarction: Secondary | ICD-10-CM | POA: Diagnosis not present

## 2017-08-25 DIAGNOSIS — I69322 Dysarthria following cerebral infarction: Secondary | ICD-10-CM | POA: Diagnosis not present

## 2017-08-25 DIAGNOSIS — I129 Hypertensive chronic kidney disease with stage 1 through stage 4 chronic kidney disease, or unspecified chronic kidney disease: Secondary | ICD-10-CM | POA: Diagnosis not present

## 2017-08-25 DIAGNOSIS — R52 Pain, unspecified: Secondary | ICD-10-CM | POA: Diagnosis not present

## 2017-08-25 DIAGNOSIS — N400 Enlarged prostate without lower urinary tract symptoms: Secondary | ICD-10-CM | POA: Diagnosis not present

## 2017-08-25 DIAGNOSIS — R7303 Prediabetes: Secondary | ICD-10-CM | POA: Diagnosis not present

## 2017-08-25 DIAGNOSIS — R2689 Other abnormalities of gait and mobility: Secondary | ICD-10-CM | POA: Diagnosis not present

## 2017-08-25 DIAGNOSIS — N183 Chronic kidney disease, stage 3 (moderate): Secondary | ICD-10-CM | POA: Diagnosis not present

## 2017-08-25 DIAGNOSIS — E785 Hyperlipidemia, unspecified: Secondary | ICD-10-CM | POA: Diagnosis not present

## 2017-08-26 DIAGNOSIS — I69398 Other sequelae of cerebral infarction: Secondary | ICD-10-CM | POA: Diagnosis not present

## 2017-08-26 DIAGNOSIS — I69320 Aphasia following cerebral infarction: Secondary | ICD-10-CM | POA: Diagnosis not present

## 2017-08-26 DIAGNOSIS — R2689 Other abnormalities of gait and mobility: Secondary | ICD-10-CM | POA: Diagnosis not present

## 2017-08-26 DIAGNOSIS — R7303 Prediabetes: Secondary | ICD-10-CM | POA: Diagnosis not present

## 2017-08-26 DIAGNOSIS — I69322 Dysarthria following cerebral infarction: Secondary | ICD-10-CM | POA: Diagnosis not present

## 2017-08-26 DIAGNOSIS — I129 Hypertensive chronic kidney disease with stage 1 through stage 4 chronic kidney disease, or unspecified chronic kidney disease: Secondary | ICD-10-CM | POA: Diagnosis not present

## 2017-08-26 DIAGNOSIS — R52 Pain, unspecified: Secondary | ICD-10-CM | POA: Diagnosis not present

## 2017-08-26 DIAGNOSIS — N400 Enlarged prostate without lower urinary tract symptoms: Secondary | ICD-10-CM | POA: Diagnosis not present

## 2017-08-26 DIAGNOSIS — E785 Hyperlipidemia, unspecified: Secondary | ICD-10-CM | POA: Diagnosis not present

## 2017-08-26 DIAGNOSIS — N183 Chronic kidney disease, stage 3 (moderate): Secondary | ICD-10-CM | POA: Diagnosis not present

## 2017-09-01 ENCOUNTER — Ambulatory Visit: Payer: Medicare HMO | Admitting: Family Medicine

## 2017-09-01 ENCOUNTER — Encounter: Payer: Self-pay | Admitting: Family Medicine

## 2017-09-01 ENCOUNTER — Other Ambulatory Visit: Payer: Self-pay

## 2017-09-01 DIAGNOSIS — R7303 Prediabetes: Secondary | ICD-10-CM | POA: Diagnosis not present

## 2017-09-01 DIAGNOSIS — F1410 Cocaine abuse, uncomplicated: Secondary | ICD-10-CM | POA: Diagnosis not present

## 2017-09-01 DIAGNOSIS — I63312 Cerebral infarction due to thrombosis of left middle cerebral artery: Secondary | ICD-10-CM | POA: Diagnosis not present

## 2017-09-01 DIAGNOSIS — N521 Erectile dysfunction due to diseases classified elsewhere: Secondary | ICD-10-CM | POA: Diagnosis not present

## 2017-09-01 DIAGNOSIS — N179 Acute kidney failure, unspecified: Secondary | ICD-10-CM | POA: Insufficient documentation

## 2017-09-01 DIAGNOSIS — R69 Illness, unspecified: Secondary | ICD-10-CM | POA: Diagnosis not present

## 2017-09-01 DIAGNOSIS — N529 Male erectile dysfunction, unspecified: Secondary | ICD-10-CM | POA: Insufficient documentation

## 2017-09-01 HISTORY — DX: Acute kidney failure, unspecified: N17.9

## 2017-09-01 MED ORDER — TAMSULOSIN HCL 0.4 MG PO CAPS: 0.4000 mg | ORAL_CAPSULE | Freq: Every day | ORAL | 1 refills | Status: DC

## 2017-09-01 MED ORDER — ATORVASTATIN CALCIUM 80 MG PO TABS: 80.0000 mg | ORAL_TABLET | Freq: Every day | ORAL | 3 refills | Status: DC

## 2017-09-01 MED ORDER — ASPIRIN 325 MG PO TBEC: 325.0000 mg | DELAYED_RELEASE_TABLET | Freq: Every day | ORAL | 3 refills | Status: DC

## 2017-09-01 NOTE — Patient Instructions (Signed)

## 2017-09-01 NOTE — Assessment & Plan Note (Signed)
Hospital result reviewed and discussed with patient. He is working on quitting. I recommended discussion with social work today to give resources but he declined. We will follow.

## 2017-09-01 NOTE — Assessment & Plan Note (Signed)
May be related CVA vs Pre-DM vs hypogonadism. Return for early morning testosterone check, TSH.  Marland Kitchen

## 2017-09-01 NOTE — Progress Notes (Signed)
Subjective:     Patient ID: David [name], male   DOB: April 05, 1956, 62 y.o.   MRN: [id number]  HPI CVA F/U: recently discharged from the hospital for acute CVA. He has been getting speech therapy. He is still worried about his speech. His gait is fine. He had home PT once since his discharge. He has been compliant with ASA and Statin. Need refill of his med. PreDM: Denies any concern. Here for follow-up Substance abuse: He is working on quitting. ED: Since onset of stroke he has been having issues with erection. Problem with having erection and sustaining it. Denies any other GU symptoms. AKI: Here for follow-up from hospital admission.  Current Outpatient Medications on File Prior to Visit  Medication Sig Dispense Refill  . aspirin 325 MG EC tablet Take 1 tablet (325 mg total) by mouth daily. 30 tablet 0  . atorvastatin (LIPITOR) 80 MG tablet Take 1 tablet (80 mg total) by mouth daily at 6 PM. 30 tablet 0  . tamsulosin (FLOMAX) 0.4 MG CAPS capsule Take 1 capsule (0.4 mg total) by mouth daily. 90 capsule 2   No current facility-administered medications on file prior to visit.    Past Medical History:  Diagnosis Date  . Benign prostatic hyperplasia    Vitals:   09/01/17 1359  BP: 112/64  Pulse: 79  Temp: 98 F (36.7 C)  TempSrc: Oral  SpO2: 99%  Weight: 148 lb (67.1 kg)  Height: 5\' 10"  (1.778 m)     Review of Systems  HENT:       Speech concern  Respiratory: Negative.   Cardiovascular: Negative.   Gastrointestinal: Negative.   Genitourinary:       Erectile issue  Neurological: Negative.   All other systems reviewed and are negative.      Objective:   Physical Exam  Constitutional: He is oriented to person, place, and time. He appears well-developed. No distress.  Cardiovascular: Normal rate, regular rhythm and normal heart sounds.  No murmur heard. Pulmonary/Chest: Effort normal and breath sounds normal. No respiratory distress. He has no wheezes.  Abdominal: Soft.  Bowel sounds are normal. He exhibits no distension and no mass. There is no tenderness.  Musculoskeletal: Normal range of motion. He exhibits no edema.  Neurological: He is alert and oriented to person, place, and time. He has normal strength and normal reflexes. No cranial nerve deficit or sensory deficit. Coordination and gait normal.  Nursing note and vitals reviewed.      Assessment:     CVA F/U: PreDM: Substance abuse: ED:  AKI:    Plan:     Check problem list

## 2017-09-01 NOTE — Assessment & Plan Note (Addendum)
No acute findings. He is compliant with his medications. ASA and Statin refilled. I completed the speech therapy order form. F/U with neuro as planned.

## 2017-09-01 NOTE — Assessment & Plan Note (Signed)
Diet and exercise counseling done. No treatment needed at this time.

## 2017-09-01 NOTE — Assessment & Plan Note (Signed)
Creatinine level on admission was elevated. We will recheck when he comes back in for lab work. Keep well hydrated recommended.

## 2017-09-06 ENCOUNTER — Other Ambulatory Visit: Payer: Medicare HMO

## 2018-03-15 ENCOUNTER — Other Ambulatory Visit: Payer: Self-pay | Admitting: Family Medicine

## 2018-03-27 ENCOUNTER — Other Ambulatory Visit: Payer: Self-pay | Admitting: Family Medicine

## 2018-04-28 ENCOUNTER — Encounter: Payer: Self-pay | Admitting: Family Medicine

## 2018-04-29 ENCOUNTER — Encounter: Payer: Self-pay | Admitting: Family Medicine

## 2018-04-29 ENCOUNTER — Other Ambulatory Visit: Payer: Self-pay

## 2018-04-29 ENCOUNTER — Ambulatory Visit (INDEPENDENT_AMBULATORY_CARE_PROVIDER_SITE_OTHER): Payer: Medicare HMO | Admitting: Family Medicine

## 2018-04-29 VITALS — BP 110/70 | HR 67 | Temp 97.8°F | Wt 142.0 lb

## 2018-04-29 DIAGNOSIS — R7309 Other abnormal glucose: Secondary | ICD-10-CM

## 2018-04-29 DIAGNOSIS — R7303 Prediabetes: Secondary | ICD-10-CM

## 2018-04-29 DIAGNOSIS — N400 Enlarged prostate without lower urinary tract symptoms: Secondary | ICD-10-CM | POA: Diagnosis not present

## 2018-04-29 DIAGNOSIS — H9190 Unspecified hearing loss, unspecified ear: Secondary | ICD-10-CM | POA: Insufficient documentation

## 2018-04-29 DIAGNOSIS — Z8673 Personal history of transient ischemic attack (TIA), and cerebral infarction without residual deficits: Secondary | ICD-10-CM

## 2018-04-29 DIAGNOSIS — R479 Unspecified speech disturbances: Secondary | ICD-10-CM

## 2018-04-29 DIAGNOSIS — Z23 Encounter for immunization: Secondary | ICD-10-CM

## 2018-04-29 DIAGNOSIS — E785 Hyperlipidemia, unspecified: Secondary | ICD-10-CM | POA: Diagnosis not present

## 2018-04-29 DIAGNOSIS — R69 Illness, unspecified: Secondary | ICD-10-CM | POA: Diagnosis not present

## 2018-04-29 DIAGNOSIS — F1410 Cocaine abuse, uncomplicated: Secondary | ICD-10-CM

## 2018-04-29 DIAGNOSIS — H9193 Unspecified hearing loss, bilateral: Secondary | ICD-10-CM | POA: Diagnosis not present

## 2018-04-29 DIAGNOSIS — F1210 Cannabis abuse, uncomplicated: Secondary | ICD-10-CM

## 2018-04-29 DIAGNOSIS — R350 Frequency of micturition: Secondary | ICD-10-CM | POA: Diagnosis not present

## 2018-04-29 DIAGNOSIS — IMO0001 Reserved for inherently not codable concepts without codable children: Secondary | ICD-10-CM | POA: Insufficient documentation

## 2018-04-29 LAB — POCT URINALYSIS DIP (MANUAL ENTRY)
BILIRUBIN UA: NEGATIVE
GLUCOSE UA: NEGATIVE mg/dL
Ketones, POC UA: NEGATIVE mg/dL
Leukocytes, UA: NEGATIVE
NITRITE UA: NEGATIVE
RBC UA: NEGATIVE
SPEC GRAV UA: 1.025 (ref 1.010–1.025)
Urobilinogen, UA: 1 E.U./dL
pH, UA: 6.5 (ref 5.0–8.0)

## 2018-04-29 LAB — POCT GLYCOSYLATED HEMOGLOBIN (HGB A1C): HbA1c, POC (controlled diabetic range): 5.9 % (ref 0.0–7.0)

## 2018-04-29 NOTE — Assessment & Plan Note (Signed)
A1C improved today. Continue diet and exercise.

## 2018-04-29 NOTE — Assessment & Plan Note (Addendum)
Hx of CVA. No residual motor deficit. Still some difficulty with his speech. Will refer back to neurology and speech pathology. Continue ASA and Statin. May be able to cut back to ASA 81 mg qd from 325 mg. Will defer to neurology at f/u. ED precaution discussed.

## 2018-04-29 NOTE — Assessment & Plan Note (Signed)
Worsening symptoms. PSA checked. Urology referral placed. UA looks fine. I called and discussed result with him.

## 2018-04-29 NOTE — Progress Notes (Signed)
  Subjective:     Patient ID: David [name], male   DOB: [birth year], 62 y.o.   MRN: [id number]  HPI CVA: Patient stated that since she was d/c from the hospital for stroke he had not regained his speech. Still having some difficulty. Denies gait issues, no recent fall, no other stroke related symptoms. Initially, he stated that he saw neuro 3-4 weeks after hospital d/c, then he stated that he had not followed with neuro or speech pathologist since he left the hospital and he will like to get referred today.  PreDM: Here for follow-up. Muffled hearing: He stated that he voice sounds muffled in his ears B/L. He denies ear discharge, no pain. DSK:AJGOTLX stated that he is having increase urine frequency and urgency. This has worsened in the last 3-4 months. Denies other GU symptoms. He is compliant with his Flomax. Drug:He is working hard on cutting back. Last THC&cocaine use was 3-4 days ago, he stated that he will normally use more frequently than that. He will like to get help on this.  Current Outpatient Medications on File Prior to Visit  Medication Sig Dispense Refill  . aspirin 325 MG EC tablet TAKE 1 TABLET BY MOUTH EVERY DAY 30 tablet 3  . atorvastatin (LIPITOR) 80 MG tablet TAKE 1 TABLET (80 MG TOTAL) BY MOUTH DAILY AT 6 PM. 30 tablet 3  . tamsulosin (FLOMAX) 0.4 MG CAPS capsule TAKE 1 CAPSULE BY MOUTH EVERY DAY 90 capsule 1   No current facility-administered medications on file prior to visit.    Past Medical History:  Diagnosis Date  . AKI (acute kidney injury) ([place]) 09/01/2017  . Benign prostatic hyperplasia    Vitals:   04/29/18 0928  BP: 110/70  Pulse: 67  Temp: 97.8 F (36.6 C)  TempSrc: Oral  SpO2: 98%  Weight: 142 lb (64.4 kg)     Review of Systems  HENT:       Muffled hearing  Respiratory: Negative.   Cardiovascular: Negative.   Gastrointestinal: Negative.   Genitourinary: Positive for frequency and urgency. Negative for dysuria and hematuria.  Musculoskeletal:  Negative.   Neurological: Positive for speech difficulty. Negative for dizziness and seizures.  All other systems reviewed and are negative.      Objective:   Physical Exam  Constitutional: He is oriented to person, place, and time. He appears well-developed. No distress.  HENT:  Deferred   Cardiovascular: Normal rate, regular rhythm and normal [name] sounds. Exam reveals no friction rub.  No murmur heard. Pulmonary/Chest: Effort normal and breath sounds normal. No stridor. No respiratory distress. He has no wheezes.  Abdominal: Soft. Bowel sounds are normal. He exhibits no distension and no mass. There is no tenderness.  Musculoskeletal: He exhibits no edema or deformity.  Neurological: He is alert and oriented to person, place, and time. He displays normal reflexes. No cranial nerve deficit or sensory deficit. He exhibits normal muscle tone. Coordination and gait normal.  Nursing note and vitals reviewed.      Assessment:     Hx of CVA PreDM Muffled hearing BPH Cocaine and marijuana use.    Plan:     Check problem list.

## 2018-04-29 NOTE — Patient Instructions (Signed)
Benign Prostatic Hyperplasia  Benign prostatic hyperplasia (BPH) is an enlarged prostate gland that is caused by the normal aging process and not by cancer. The prostate is a walnut-sized gland that is involved in the production of semen. It is located in front of the rectum and below the bladder. The bladder stores urine and the urethra is the tube that carries the urine out of the body. The prostate may get bigger as a man gets older.  An enlarged prostate can press on the urethra. This can make it harder to pass urine. The build-up of urine in the bladder can cause infection. Back pressure and infection may progress to bladder damage and kidney (renal) failure.  What are the causes?  This condition is part of a normal aging process. However, not all men develop problems from this condition. If the prostate enlarges away from the urethra, urine flow will not be blocked. If it enlarges toward the urethra and compresses it, there will be problems passing urine.  What increases the risk?  This condition is more likely to develop in men over the age of 50 years.  What are the signs or symptoms?  Symptoms of this condition include:  · Getting up often during the night to urinate.  · Needing to urinate frequently during the day.  · Difficulty starting urine flow.  · Decrease in size and strength of your urine stream.  · Leaking (dribbling) after urinating.  · Inability to pass urine. This needs immediate treatment.  · Inability to completely empty your bladder.  · Pain when you pass urine. This is more common if there is also an infection.  · Urinary tract infection (UTI).    How is this diagnosed?  This condition is diagnosed based on your medical history, a physical exam, and your symptoms. Tests will also be done, such as:  · A post-void bladder scan. This measures any amount of urine that may remain in your bladder after you finish urinating.  · A digital rectal exam. In a rectal exam, your health care provider  checks your prostate by putting a lubricated, gloved finger into your rectum to feel the back of your prostate gland. This exam detects the size of your gland and any abnormal lumps or growths.  · An exam of your urine (urinalysis).  · A prostate specific antigen (PSA) screening. This is a blood test used to screen for prostate cancer.  · An ultrasound. This test uses sound waves to electronically produce a picture of your prostate gland.    Your health care provider may refer you to a specialist in kidney and prostate diseases (urologist).  How is this treated?  Once symptoms begin, your health care provider will monitor your condition (active surveillance or watchful waiting). Treatment for this condition will depend on the severity of your condition. Treatment may include:  · Observation and yearly exams. This may be the only treatment needed if your condition and symptoms are mild.  · Medicines to relieve your symptoms, including:  ? Medicines to shrink the prostate.  ? Medicines to relax the muscle of the prostate.  · Surgery in severe cases. Surgery may include:  ? Prostatectomy. In this procedure, the prostate tissue is removed completely through an open incision or with a laparascope or robotics.  ? Transurethral resection of the prostate (TURP). In this procedure, a tool is inserted through the opening at the tip of the penis (urethra). It is used to cut away tissue of   the inner core of the prostate. The pieces are removed through the same opening of the penis. This removes the blockage.  ? Transurethral incision (TUIP). In this procedure, small cuts are made in the prostate. This lessens the prostate's pressure on the urethra.  ? Transurethral microwave thermotherapy (TUMT). This procedure uses microwaves to create heat. The heat destroys and removes a small amount of prostate tissue.  ? Transurethral needle ablation (TUNA). This procedure uses radio frequencies to destroy and remove a small amount of  prostate tissue.  ? Interstitial laser coagulation (ILC). This procedure uses a laser to destroy and remove a small amount of prostate tissue.  ? Transurethral electrovaporization (TUVP). This procedure uses electrodes to destroy and remove a small amount of prostate tissue.  ? Prostatic urethral lift. This procedure inserts an implant to push the lobes of the prostate away from the urethra.    Follow these instructions at home:  · Take over-the-counter and prescription medicines only as told by your health care provider.  · Monitor your symptoms for any changes. Contact your health care provider with any changes.  · Avoid drinking large amounts of liquid before going to bed or out in public.  · Avoid or reduce how much caffeine or alcohol you drink.  · Give yourself time when you urinate.  · Keep all follow-up visits as told by your health care provider. This is important.  Contact a health care provider if:  · You have unexplained back pain.  · Your symptoms do not get better with treatment.  · You develop side effects from the medicine you are taking.  · Your urine becomes very dark or has a bad smell.  · Your lower abdomen becomes distended and you have trouble passing your urine.  Get help right away if:  · You have a fever or chills.  · You suddenly cannot urinate.  · You feel lightheaded, or very dizzy, or you faint.  · There are large amounts of blood or clots in the urine.  · Your urinary problems become hard to manage.  · You develop moderate to severe low back or flank pain. The flank is the side of your body between the ribs and the hip.  These symptoms may represent a serious problem that is an emergency. Do not wait to see if the symptoms will go away. Get medical help right away. Call your local emergency services (911 in the [place] Do not drive yourself to the hospital.  Summary  · Benign prostatic hyperplasia (BPH) is an enlarged prostate that is caused by the normal aging process and not by  cancer.  · An enlarged prostate can press on the urethra. This can make it hard to pass urine.  · This condition is part of a normal aging process and is more likely to develop in men over the age of 50 years.  · Get help right away if you suddenly cannot urinate.  This information is not intended to replace advice given to you by your health care provider. Make sure you discuss any questions you have with your health care provider.  Document Released: 08/17/2005 Document Revised: 09/21/2016 Document Reviewed: 09/21/2016  Elsevier Interactive Patient Education © 2018 Elsevier Inc.

## 2018-04-29 NOTE — Assessment & Plan Note (Signed)
He agreed to receive community help to quit. I will make connection with our social worker to help.

## 2018-04-29 NOTE — Assessment & Plan Note (Signed)
Unclear if this is related to his speech vs ear problem. Unfortunately I did not look into his ears today. I called him to discuss the need for ear exam. He agreed to return to nurse clinic next week for hearing screening and I will be available to look into his ears then. ED precaution discussed. He agreed with the plan.

## 2018-04-30 LAB — BASIC METABOLIC PANEL
BUN / CREAT RATIO: 16 (ref 10–24)
BUN: 25 mg/dL (ref 8–27)
CALCIUM: 9.1 mg/dL (ref 8.6–10.2)
CO2: 24 mmol/L (ref 20–29)
CREATININE: 1.54 mg/dL — AB (ref 0.76–1.27)
Chloride: 103 mmol/L (ref 96–106)
GFR, EST AFRICAN AMERICAN: 55 mL/min/{1.73_m2} — AB (ref >59)
GFR, EST NON AFRICAN AMERICAN: 47 mL/min/{1.73_m2} — AB (ref >59)
Glucose: 92 mg/dL (ref 65–99)
Potassium: 4.3 mmol/L (ref 3.5–5.2)
Sodium: 142 mmol/L (ref 134–144)

## 2018-04-30 LAB — LIPID PANEL
CHOLESTEROL TOTAL: 194 mg/dL (ref 100–199)
Chol/HDL Ratio: 3.3 ratio (ref 0.0–5.0)
HDL: 59 mg/dL (ref >39)
LDL CALC: 106 mg/dL — AB (ref 0–99)
Triglycerides: 143 mg/dL (ref 0–149)
VLDL CHOLESTEROL CAL: 29 mg/dL (ref 5–40)

## 2018-04-30 LAB — PSA, TOTAL AND FREE
PSA FREE: 0.44 ng/mL
PSA, Free Pct: 13.3 %
Prostate Specific Ag, Serum: 3.3 ng/mL (ref 0.0–4.0)

## 2018-05-03 ENCOUNTER — Telehealth: Payer: Self-pay | Admitting: Family Medicine

## 2018-05-03 ENCOUNTER — Encounter: Payer: Self-pay | Admitting: Family Medicine

## 2018-05-03 NOTE — Telephone Encounter (Signed)
I was unable to reach him on all numbers listed and I was unable to leave a message either. The [phone] is apparently a wrong number.  I will send a result letter to his home address.  Note: Creatinine level went up. Likely due to high intensity statin vs dehydration. Unfortunately his LDL is not goal for his CVA. I will recommend improve hydration and repeat renal check in 4 weeks.

## 2018-05-04 ENCOUNTER — Ambulatory Visit: Payer: Medicare HMO

## 2018-05-06 ENCOUNTER — Encounter: Payer: Self-pay | Admitting: Family Medicine

## 2018-05-06 ENCOUNTER — Ambulatory Visit: Payer: Medicare HMO

## 2018-05-06 ENCOUNTER — Ambulatory Visit: Payer: Medicare HMO | Admitting: Licensed Clinical Social Worker

## 2018-05-06 DIAGNOSIS — Z0110 Encounter for examination of ears and hearing without abnormal findings: Secondary | ICD-10-CM

## 2018-05-06 DIAGNOSIS — H9193 Unspecified hearing loss, bilateral: Secondary | ICD-10-CM

## 2018-05-06 DIAGNOSIS — F1410 Cocaine abuse, uncomplicated: Secondary | ICD-10-CM

## 2018-05-06 NOTE — Progress Notes (Signed)
Pt here for hearing screen. Documented in screening tab. Wallace Cullens, RN

## 2018-05-06 NOTE — Progress Notes (Signed)
Seen for follow up in the RN clinic. He passed hearing screen per Ailene Ravel. I looked into his ears and it looks fine. He still wants to be referred to the ENT. I placed referral.  I discussed his kidney function and Statin use. Since his LDL is still not goal, it makes sense not to change dose for now due to stroke risk. Return in 2-4 weeks for Kidney function.  I did ask him to call neuro for appointment, since they already tried to reach him to schedule an appointment. He agreed with the plan.

## 2018-05-06 NOTE — Progress Notes (Addendum)
Type of Service: [place] Time:40 minutes Interpreter:No.   David [name] is a 62 y.o. male referred by Dr. Gwendlyn Deutscher for Substance use Treatment. Patient expressed his motivation to begin treatment was in part due to his recent stroke in December.   Mental Health/ Substance Hx: No History of Mental Health identified. Substance use over 6 years last use was 9/5. Patient had inpatient treatment in 2012 at [facility]. Patient expressed having a bad experience here and was unwilling to receive services by [facility].   Life & Social patient lives alone in a rooming house. Works part time at [employer]. No Support system or relatives locally. Transportation barriers, uses the transit bus.  Issues discussed: , Inpatient and outpatient treatment resources, Counseling Resources.  GOALS ADDRESSED:  Patient will:    1. Increase knowledge and/or ability of: self-management skills  2. Demonstrate ability to: Decrease self-medicating behaviors 3.   Reduce substance use   Intervention: Reflective listening, supportive counseling,Community Resource and Referral to Substance Abuse Program   Assessment/Plan:  Patient has a history of substance use and appears to be in between the precontemplation and contemplation stage of change.[facility] intern discussed inpatient and outpatient options. Patient was not open to inpatient avalible resources at this time. Patient may benefit from and is in agreement to  1. Call [place] and [facility] to get linked to intensive outpatient services. 2. [facility] Intern will F/U with patient in a week to assess progress.       [name] MSW Social Work Intern [employer] [phone]

## 2018-05-12 ENCOUNTER — Telehealth: Payer: Self-pay | Admitting: Licensed Clinical Social Worker

## 2018-05-12 NOTE — Telephone Encounter (Signed)
[facility] attempted FU call for client to see if client had made the call to [facility] and [facility].  Telephone number was invalid. [facility] will try to get accurate number when client returns.   [name] MSW Social Work Intern [employer] [phone]

## 2018-05-20 ENCOUNTER — Ambulatory Visit: Payer: Medicare HMO | Admitting: Family Medicine

## 2018-05-30 ENCOUNTER — Telehealth: Payer: Self-pay | Admitting: Neurology

## 2018-05-30 ENCOUNTER — Ambulatory Visit: Payer: Medicare HMO | Admitting: Neurology

## 2018-05-30 NOTE — Telephone Encounter (Signed)
This patient did not show for a new patient appointment today. 

## 2018-05-31 ENCOUNTER — Encounter: Payer: Self-pay | Admitting: Neurology

## 2018-06-07 NOTE — Progress Notes (Signed)
Intern assessed patient's needs and barriers.   We discussed interventions and I concur with the plan in this note.  Casimer Lanius, [place]   [phone] 11:37 AM

## 2018-06-11 DIAGNOSIS — R58 Hemorrhage, not elsewhere classified: Secondary | ICD-10-CM | POA: Diagnosis not present

## 2018-10-26 ENCOUNTER — Ambulatory Visit (INDEPENDENT_AMBULATORY_CARE_PROVIDER_SITE_OTHER): Payer: Medicare HMO | Admitting: Family Medicine

## 2018-10-26 ENCOUNTER — Encounter: Payer: Self-pay | Admitting: Family Medicine

## 2018-10-26 ENCOUNTER — Other Ambulatory Visit: Payer: Self-pay

## 2018-10-26 VITALS — BP 110/68 | Temp 98.2°F | Wt 145.0 lb

## 2018-10-26 DIAGNOSIS — N521 Erectile dysfunction due to diseases classified elsewhere: Secondary | ICD-10-CM

## 2018-10-26 DIAGNOSIS — Z8673 Personal history of transient ischemic attack (TIA), and cerebral infarction without residual deficits: Secondary | ICD-10-CM | POA: Diagnosis not present

## 2018-10-26 DIAGNOSIS — Z0000 Encounter for general adult medical examination without abnormal findings: Secondary | ICD-10-CM

## 2018-10-26 DIAGNOSIS — R7303 Prediabetes: Secondary | ICD-10-CM | POA: Diagnosis not present

## 2018-10-26 DIAGNOSIS — R200 Anesthesia of skin: Secondary | ICD-10-CM | POA: Diagnosis not present

## 2018-10-26 LAB — POCT GLYCOSYLATED HEMOGLOBIN (HGB A1C): Hemoglobin A1C: 6.1 % — AB (ref 4.0–5.6)

## 2018-10-26 NOTE — Assessment & Plan Note (Signed)
Continue aspirin 325 and atorvastatin 80 mg daily.  Advised follow-up with neurology as he no showed his appointment.  Patient has the number for [facility] neurology and he will self schedule.

## 2018-10-26 NOTE — Assessment & Plan Note (Signed)
Normal-appearing then that appears to be healing well after what sounds like a deep laceration repair.  Reassured patient that oftentimes sensation takes much longer than 2 months to return if at all.  Patient voiced good understanding

## 2018-10-26 NOTE — Assessment & Plan Note (Signed)
And patient's medical history that includes history of stroke.  Checked A1c today which is still at prediabetic level.

## 2018-10-26 NOTE — Patient Instructions (Addendum)
Call [facility]  Address: [address] [place], [place], [place] [address] Phone: [phone]  Call [place] neurology.

## 2018-10-26 NOTE — Progress Notes (Signed)
    Subjective:  David [name] is a 63 y.o. male who presents to the [facility] today complains of right thumb numbness  HPI:  Patient states that the end of his right thumb has been numb ever since he had a deep cut to it that required stitches 2 months ago.  He does not have any weakness.  He has no rashes.  It is not impeding his daily life.  He would like to talk about his erectile dysfunction.  He is unable to afford Viagra and Cialis.  He has heard about Problast XL and wonders if this is an option for him.  Health maintenance He has not gotten a colonoscopy in years.  He never went in 2016 when he was told.    H/o stroke He has not followed up with neurology.  States that he still has some speech difficulties with word finding difficulties.  He would like to see if speech therapy would be an option for him.  He is still taking his aspirin and atorvastatin as directed.    ROS: Per HPI  Social Hx: He reports that he has never smoked. He has never used smokeless tobacco. He reports that he does not drink alcohol or use drugs.   Objective:  Physical Exam: BP 110/68   Temp 98.2 F (36.8 C) (Oral)   Wt 145 lb (65.8 kg)   BMI 20.81 kg/m   Gen: NAD, resting comfortably Pulm: NWOB MSK: no edema, cyanosis, or clubbing noted Skin: warm, dry.  Decreased sensation only at the distal tip of his right thumb.  Otherwise normal-appearing with good capillary refill and no lesions. Neuro: grossly normal, moves all extremities.  Normal speech Psych: Normal affect and thought content  Results for orders placed or performed in visit on 10/26/18 (from the past 72 hour(s))  HgB A1c     Status: Abnormal   Collection Time: 10/26/18 11:10 AM  Result Value Ref Range   Hemoglobin A1C 6.1 (A) 4.0 - 5.6 %   HbA1c POC (<> result, manual entry)     HbA1c, POC (prediabetic range)     HbA1c, POC (controlled diabetic range)       Assessment/Plan:  Prediabetes And patient's medical history that  includes history of stroke.  Checked A1c today which is still at prediabetic level.  History of stroke Continue aspirin 325 and atorvastatin 80 mg daily.  Advised follow-up with neurology as he no showed his appointment.  Patient has the number for [facility] neurology and he will self schedule.  Erectile dysfunction Unfortunately patient is not able to afford Viagra or Cialis.  Discussed that Proplast XL appears to not be a legitimate medication but rather an Internet scam.  Patient voiced good understanding  Numbness of right thumb Normal-appearing then that appears to be healing well after what sounds like a deep laceration repair.  Reassured patient that oftentimes sensation takes much longer than 2 months to return if at all.  Patient voiced good understanding  Health care maintenance Per chart review patient appears to be overdue for screening colonoscopy as he was due in 2016 as per David [name] note at that time.  Recommended that he reestablish with his gastroenterologist.  Given phone number to self schedule   [name], DO PGY-3, [facility] 10/26/2018 11:16 AM

## 2018-10-26 NOTE — Assessment & Plan Note (Signed)
Unfortunately patient is not able to afford Viagra or Cialis.  Discussed that Proplast XL appears to not be a legitimate medication but rather an Internet scam.  Patient voiced good understanding

## 2018-10-26 NOTE — Assessment & Plan Note (Addendum)
Per chart review patient appears to be overdue for screening colonoscopy as he was due in 2016 as per Dr. Macario Golds note at that time.  Recommended that he reestablish with his gastroenterologist.  Given phone number to self schedule

## 2019-04-23 ENCOUNTER — Encounter (HOSPITAL_COMMUNITY): Payer: Self-pay

## 2019-04-23 ENCOUNTER — Emergency Department (HOSPITAL_COMMUNITY): Payer: Medicare HMO

## 2019-04-23 ENCOUNTER — Emergency Department (HOSPITAL_COMMUNITY)
Admission: EM | Admit: 2019-04-23 | Discharge: 2019-04-23 | Disposition: A | Discharge status: Home / Self Care | Payer: Medicare HMO | Attending: Emergency Medicine | Admitting: Emergency Medicine

## 2019-04-23 ENCOUNTER — Other Ambulatory Visit: Payer: Self-pay

## 2019-04-23 DIAGNOSIS — R569 Unspecified convulsions: Secondary | ICD-10-CM | POA: Diagnosis not present

## 2019-04-23 DIAGNOSIS — R404 Transient alteration of awareness: Secondary | ICD-10-CM | POA: Diagnosis not present

## 2019-04-23 DIAGNOSIS — Z7982 Long term (current) use of aspirin: Secondary | ICD-10-CM | POA: Insufficient documentation

## 2019-04-23 DIAGNOSIS — Z79899 Other long term (current) drug therapy: Secondary | ICD-10-CM | POA: Insufficient documentation

## 2019-04-23 DIAGNOSIS — I959 Hypotension, unspecified: Secondary | ICD-10-CM | POA: Diagnosis not present

## 2019-04-23 DIAGNOSIS — R4182 Altered mental status, unspecified: Secondary | ICD-10-CM | POA: Diagnosis not present

## 2019-04-23 DIAGNOSIS — Z8673 Personal history of transient ischemic attack (TIA), and cerebral infarction without residual deficits: Secondary | ICD-10-CM | POA: Diagnosis not present

## 2019-04-23 DIAGNOSIS — R55 Syncope and collapse: Secondary | ICD-10-CM | POA: Diagnosis not present

## 2019-04-23 DIAGNOSIS — D170 Benign lipomatous neoplasm of skin and subcutaneous tissue of head, face and neck: Secondary | ICD-10-CM | POA: Diagnosis not present

## 2019-04-23 DIAGNOSIS — R531 Weakness: Secondary | ICD-10-CM | POA: Diagnosis not present

## 2019-04-23 LAB — RAPID URINE DRUG SCREEN, HOSP PERFORMED
Amphetamines: NOT DETECTED
Barbiturates: NOT DETECTED
Benzodiazepines: NOT DETECTED
Cocaine: POSITIVE — AB
Opiates: NOT DETECTED
Tetrahydrocannabinol: NOT DETECTED

## 2019-04-23 LAB — CBC WITH DIFFERENTIAL/PLATELET
Abs Immature Granulocytes: 0.01 10*3/uL (ref 0.00–0.07)
Basophils Absolute: 0 10*3/uL (ref 0.0–0.1)
Basophils Relative: 1 %
Eosinophils Absolute: 0.1 10*3/uL (ref 0.0–0.5)
Eosinophils Relative: 1 %
HCT: 36.8 % — ABNORMAL LOW (ref 39.0–52.0)
Hemoglobin: 11.8 g/dL — ABNORMAL LOW (ref 13.0–17.0)
Immature Granulocytes: 0 %
Lymphocytes Relative: 42 %
Lymphs Abs: 2.2 10*3/uL (ref 0.7–4.0)
MCH: 28.1 pg (ref 26.0–34.0)
MCHC: 32.1 g/dL (ref 30.0–36.0)
MCV: 87.6 fL (ref 80.0–100.0)
Monocytes Absolute: 0.5 10*3/uL (ref 0.1–1.0)
Monocytes Relative: 9 %
Neutro Abs: 2.5 10*3/uL (ref 1.7–7.7)
Neutrophils Relative %: 47 %
Platelets: 215 10*3/uL (ref 150–400)
RBC: 4.2 MIL/uL — ABNORMAL LOW (ref 4.22–5.81)
RDW: 14 % (ref 11.5–15.5)
WBC: 5.2 10*3/uL (ref 4.0–10.5)
nRBC: 0 % (ref 0.0–0.2)

## 2019-04-23 LAB — URINALYSIS, ROUTINE W REFLEX MICROSCOPIC
Bacteria, UA: NONE SEEN
Bilirubin Urine: NEGATIVE
Glucose, UA: NEGATIVE mg/dL
Ketones, ur: NEGATIVE mg/dL
Leukocytes,Ua: NEGATIVE
Nitrite: NEGATIVE
Protein, ur: 100 mg/dL — AB
Specific Gravity, Urine: 1.01 (ref 1.005–1.030)
pH: 7 (ref 5.0–8.0)

## 2019-04-23 LAB — CBG MONITORING, ED: Glucose-Capillary: 151 mg/dL — ABNORMAL HIGH (ref 70–99)

## 2019-04-23 LAB — COMPREHENSIVE METABOLIC PANEL
ALT: 17 U/L (ref 0–44)
AST: 22 U/L (ref 15–41)
Albumin: 3.5 g/dL (ref 3.5–5.0)
Alkaline Phosphatase: 83 U/L (ref 38–126)
Anion gap: 11 (ref 5–15)
BUN: 16 mg/dL (ref 8–23)
CO2: 21 mmol/L — ABNORMAL LOW (ref 22–32)
Calcium: 8.6 mg/dL — ABNORMAL LOW (ref 8.9–10.3)
Chloride: 105 mmol/L (ref 98–111)
Creatinine, Ser: 1.41 mg/dL — ABNORMAL HIGH (ref 0.61–1.24)
GFR calc Af Amer: >60 mL/min (ref >60)
GFR calc non Af Amer: 52 mL/min — ABNORMAL LOW (ref >60)
Glucose, Bld: 182 mg/dL — ABNORMAL HIGH (ref 70–99)
Potassium: 3.3 mmol/L — ABNORMAL LOW (ref 3.5–5.1)
Sodium: 137 mmol/L (ref 135–145)
Total Bilirubin: 0.6 mg/dL (ref 0.3–1.2)
Total Protein: 7.4 g/dL (ref 6.5–8.1)

## 2019-04-23 LAB — MAGNESIUM: Magnesium: 2 mg/dL (ref 1.7–2.4)

## 2019-04-23 LAB — ETHANOL: Alcohol, Ethyl (B): <10 mg/dL (ref <10)

## 2019-04-23 MED ORDER — SODIUM CHLORIDE 0.9 % IV BOLUS
1000.0000 mL | Freq: Once | INTRAVENOUS | Status: AC
Start: 2019-04-23 — End: 2019-04-23
  Administered 2019-04-23: 1000 mL via INTRAVENOUS

## 2019-04-23 NOTE — ED Notes (Signed)
Pt's CBG result was 151. Informed Ryland - RN.

## 2019-04-23 NOTE — ED Triage Notes (Signed)
Pt brought in by [facility] from home for possible seizure like activity. According to bystanders, pt had possible petit mal seizures while sitting in a chair. Pt was lowered to floor, per EMS pt postictal on scene. Bystanders endorsed pt has hx of stroke, denies hx of seizures. Pt oriented to self and place, but unaware of what happened.

## 2019-04-23 NOTE — ED Notes (Signed)
Patient verbalizes understanding of discharge instructions. Opportunity for questioning and answers were provided. Armband removed by staff, pt discharged from ED.  

## 2019-04-23 NOTE — ED Notes (Signed)
Pt transported to MRI 

## 2019-04-23 NOTE — ED Notes (Signed)
Patient transported to CT 

## 2019-04-23 NOTE — ED Provider Notes (Signed)
[facility] EMERGENCY DEPARTMENT Provider Note   CSN: [id number] Arrival date & time: 04/23/19  1201     History   Chief Complaint Chief Complaint  Patient presents with  . Seizures    HPI David [name] is a 63 y.o. male.     HPI  David [name] is a 63 y.o. male, with a history of stroke, presenting to the ED with reported possible seizure that occurred shortly prior to arrival.  Bystanders called because patient was at a laundromat and was standing and staring, seemingly at nothing.  They were able to direct him to sit in a chair.  He was still in this state where he would not respond, but had his eyes open and was sitting upright, when fire department arrived.  EMS reports patient was responding to questions upon their arrival, but seemed confused.  This period of confusion lasted approximately 15 minutes and then patient slowly became alert and oriented. Patient denies a known history of seizures.  Denies alcohol or illicit drug use. Patient denies fever/chills, recent illness, cough, shortness of breath, chest pain, abdominal pain, N/V/D, headache, recent trauma, or any other complaints.   Past Medical History:  Diagnosis Date  . AKI (acute kidney injury) ([place]) 09/01/2017  . Benign prostatic hyperplasia   . Cerebral infarction ([place]) 08/04/2017    Patient Active Problem List   Diagnosis Date Noted  . Numbness of right thumb 10/26/2018  . Health care maintenance 10/26/2018  . History of stroke 04/29/2018  . Hearing impaired 04/29/2018  . Erectile dysfunction 09/01/2017  . Mild tetrahydrocannabinol (THC) abuse   . Slurred speech 08/03/2017  . Left hip pain 11/06/2016  . Prediabetes 11/06/2016  . Labral tear of shoulder 01/25/2014  . Adhesive capsulitis of left shoulder 12/05/2013  . Trigger point of neck 08/16/2012  . Hyperlipidemia 08/08/2012  . Elevated LFTs 08/08/2012  . Cocaine abuse ([place]) 08/08/2012  . Chronic pain 06/01/2012  . BPH (benign  prostatic hyperplasia) 09/09/2011  . ROTATOR CUFF TEAR 12/03/2008    Past Surgical History:  Procedure Laterality Date  . SHOULDER ARTHROSCOPY W/ ROTATOR CUFF REPAIR  10/21/10   At [facility], R shoulder        Home Medications    Prior to Admission medications   Medication Sig Start Date End Date Taking? Authorizing Provider  aspirin 325 MG EC tablet TAKE 1 TABLET BY MOUTH EVERY DAY Patient taking differently: Take 325 mg by mouth daily.  03/15/18  Yes Kinnie Feil, MD  atorvastatin (LIPITOR) 80 MG tablet TAKE 1 TABLET (80 MG TOTAL) BY MOUTH DAILY AT 6 PM. 03/15/18  Yes Eniola, Kehinde T, MD  tamsulosin (FLOMAX) 0.4 MG CAPS capsule TAKE 1 CAPSULE BY MOUTH EVERY DAY Patient taking differently: Take 0.4 mg by mouth daily.  03/28/18  Yes Kinnie Feil, MD    Family History No family history on file.  Social History Social History   Tobacco Use  . Smoking status: Never Smoker  . Smokeless tobacco: Never Used  Substance Use Topics  . Alcohol use: No    Frequency: Never    Comment: occasion  . Drug use: No     Allergies   Patient has no known allergies.   Review of Systems Review of Systems  Constitutional: Negative for chills, diaphoresis and fever.  Eyes: Negative for visual disturbance.  Respiratory: Negative for cough and shortness of breath.   Cardiovascular: Negative for chest pain.  Gastrointestinal: Negative for abdominal pain, diarrhea,  nausea and vomiting.  Neurological: Positive for seizures (??). Negative for dizziness, syncope, weakness, light-headedness, numbness and headaches.  Psychiatric/Behavioral: Positive for confusion (resolved).  All other systems reviewed and are negative.    Physical Exam Updated Vital Signs BP 96/66 (BP Location: Right Arm)   Pulse 63   Temp 97.7 F (36.5 C) (Oral)   Resp 20   Ht 5\' 10"  (1.778 m)   Wt 68 kg   SpO2 100%   BMI 21.52 kg/m   Physical Exam Vitals signs and nursing note reviewed.  Constitutional:       General: He is not in acute distress.    Appearance: He is well-developed. He is not diaphoretic.  HENT:     Head: Normocephalic and atraumatic.     Mouth/Throat:     Mouth: Mucous membranes are moist.     Pharynx: Oropharynx is clear.  Eyes:     Conjunctiva/sclera: Conjunctivae normal.  Neck:     Musculoskeletal: Neck supple.  Cardiovascular:     Rate and Rhythm: Normal rate and regular rhythm.     Pulses: Normal pulses.          Radial pulses are 2+ on the right side and 2+ on the left side.       Posterior tibial pulses are 2+ on the right side and 2+ on the left side.     Heart sounds: Normal heart sounds.     Comments: Tactile temperature in the extremities appropriate and equal bilaterally. Pulmonary:     Effort: Pulmonary effort is normal. No respiratory distress.     Breath sounds: Normal breath sounds.  Abdominal:     Palpations: Abdomen is soft.     Tenderness: There is no abdominal tenderness. There is no guarding.  Musculoskeletal:     Right lower leg: No edema.     Left lower leg: No edema.  Lymphadenopathy:     Cervical: No cervical adenopathy.  Skin:    General: Skin is warm and dry.  Neurological:     Mental Status: He is alert and oriented to person, place, and time.     Comments: Sensation grossly intact to light touch in the extremities. No noted speech deficits. No aphasia. Patient handles oral secretions without difficulty. No noted swallowing defects.  Equal grip strength bilaterally. No arm drift. Strength 5/5 in the upper extremities. Strength 5/5 in the lower extremities.  Negative Romberg. No gait disturbance.  Coordination intact including heel to shin and finger to nose.  Cranial nerves III-XII grossly intact.  No noted visual field deficit. No facial droop.   Psychiatric:        Mood and Affect: Mood and affect normal.        Speech: Speech normal.        Behavior: Behavior normal.      ED Treatments / Results  Labs (all labs  ordered are listed, but only abnormal results are displayed) Labs Reviewed  COMPREHENSIVE METABOLIC PANEL - Abnormal; Notable for the following components:      Result Value   Potassium 3.3 (*)    CO2 21 (*)    Glucose, Bld 182 (*)    Creatinine, Ser 1.41 (*)    Calcium 8.6 (*)    GFR calc non Af Amer 52 (*)    All other components within normal limits  CBC WITH DIFFERENTIAL/PLATELET - Abnormal; Notable for the following components:   RBC 4.20 (*)    Hemoglobin 11.8 (*)    HCT  36.8 (*)    All other components within normal limits  URINALYSIS, ROUTINE W REFLEX MICROSCOPIC - Abnormal; Notable for the following components:   Hgb urine dipstick SMALL (*)    Protein, ur 100 (*)    All other components within normal limits  RAPID URINE DRUG SCREEN, HOSP PERFORMED - Abnormal; Notable for the following components:   Cocaine POSITIVE (*)    All other components within normal limits  CBG MONITORING, ED - Abnormal; Notable for the following components:   Glucose-Capillary 151 (*)    All other components within normal limits  MAGNESIUM  ETHANOL   BUN  Date Value Ref Range Status  04/23/2019 16 8 - 23 mg/dL Final  04/29/2018 25 8 - 27 mg/dL Final  08/03/2017 21 (H) 6 - 20 mg/dL Final  08/03/2017 18 6 - 20 mg/dL Final  11/06/2016 19 7 - 25 mg/dL Final   Creat  Date Value Ref Range Status  11/06/2016 1.22 0.70 - 1.25 mg/dL Final    Comment:      For patients > or = 64 years of age: The upper reference limit for Creatinine is approximately 13% higher for people identified as African-American.     12/05/2013 1.13 0.50 - 1.35 mg/dL Final  08/02/2012 1.11 0.50 - 1.35 mg/dL Final   Creatinine, Ser  Date Value Ref Range Status  04/23/2019 1.41 (H) 0.61 - 1.24 mg/dL Final  04/29/2018 1.54 (H) 0.76 - 1.27 mg/dL Final  08/03/2017 1.30 (H) 0.61 - 1.24 mg/dL Final  08/03/2017 1.39 (H) 0.61 - 1.24 mg/dL Final   Hemoglobin  Date Value Ref Range Status  04/23/2019 11.8 (L) 13.0 - 17.0  g/dL Final  08/03/2017 14.6 13.0 - 17.0 g/dL Final  08/03/2017 13.0 13.0 - 17.0 g/dL Final  08/02/2012 12.6 (L) 13.0 - 17.0 g/dL Final    EKG EKG Interpretation  Date/Time:  Sunday April 23 2019 12:21:51 EDT Ventricular Rate:  65 PR Interval:    QRS Duration: 80 QT Interval:  433 QTC Calculation: 451 R Axis:   75 Text Interpretation:  Sinus rhythm Consider left atrial enlargement similar to 5/16 Confirmed by Aletta Edouard [phone]) on 04/23/2019 12:39:10 PM   Radiology Ct Head Wo Contrast  Result Date: 04/23/2019 CLINICAL DATA:  According to bystanders, pt had possible petit mal seizures while sitting in a chair. EXAM: CT HEAD WITHOUT CONTRAST TECHNIQUE: Contiguous axial images were obtained from the base of the skull through the vertex without intravenous contrast. COMPARISON:  08/03/2017, 08/04/2017 CT and MRI FINDINGS: Brain: There is significant periventricular white matter changes consistent with small vessel disease. Remote infarcts are identified within the basal ganglia bilaterally. There is no intra or extra-axial fluid collection or mass lesion. The basilar cisterns and ventricles have a normal appearance. There is no CT evidence for acute infarction or hemorrhage. Vascular: Mild atherosclerotic calcification of the carotid arteries. No hyperdense vessels. Skull: Normal. Negative for fracture or focal lesion. Sinuses/Orbits: No acute finding. Other: LEFT posterior parietal scalp lipoma. IMPRESSION: 1. No evidence for acute intracranial abnormality. 2. Significant microvascular disease. 3. Remote bilateral basal ganglia infarcts. Electronically Signed   By: Nolon Nations M.D.   On: 04/23/2019 13:47   Mr Brain Wo Contrast  Result Date: 04/23/2019 CLINICAL DATA:  63 year old male with possible seizure activity. Altered mental status. EXAM: MRI HEAD WITHOUT CONTRAST TECHNIQUE: Multiplanar, multiecho pulse sequences of the brain and surrounding structures were obtained without  intravenous contrast. COMPARISON:  Head CT earlier today. Brain MRI and intracranial MRA  08/04/2017. FINDINGS: Brain: No restricted diffusion or evidence of acute infarction. Extensive chronic small vessel ischemia in the bilateral basal ganglia, left deep white matter capsule and thalamus. Left-side Wallerian degeneration. Superimposed patchy pontine T2 hyperintensity superimposed on a chronic left pontine lacune. Patchy additional bilateral white matter T2 and FLAIR hyperintensity. No midline shift, mass effect, evidence of mass lesion, ventriculomegaly, extra-axial collection or acute intracranial hemorrhage. Cervicomedullary junction and pituitary are within normal limits. No chronic blood products identified. No cortical encephalomalacia. Hippocampal formations appear symmetric. Vascular: Major intracranial vascular flow voids are stable since 2018. Skull and upper cervical spine: Mild C3-C4 degenerative changes. Visualized bone marrow signal is within normal limits. Sinuses/Orbits: Negative orbits. Paranasal sinuses and mastoids are stable and well pneumatized. Other: Visible internal auditory structures appear normal. Left posterior convexity scalp lipoma has not significantly changed since 2018. IMPRESSION: 1.  No acute intracranial abnormality. 2. Extensive chronic small vessel ischemia. Associated brainstem Wallerian degeneration. Electronically Signed   By: Genevie Ann M.D.   On: 04/23/2019 17:56    Procedures Procedures (including critical care time)  Medications Ordered in ED Medications  sodium chloride 0.9 % bolus 1,000 mL (0 mLs Intravenous Stopped 04/23/19 1336)     Initial Impression / Assessment and Plan / ED Course  I have reviewed the triage vital signs and the nursing notes.  Pertinent labs & imaging results that were available during my care of the patient were reviewed by me and considered in my medical decision making (see chart for details).  Clinical Course as of Apr 22 1838   Nancy Fetter Apr 23, 5871  8212 63 year old male brought in for evaluation of possible seizure-like activity.  Patient is currently tired appearing but no complaints.  He has no idea what happened to him earlier.  He said he is never had a seizure before.  He denies any alcohol or drugs.  He is getting some screening labs and head CT.   [MB]  1313 Noted to be 14.6 a year ago with no more recent values with which to compare.  Hemoglobin(!): 11.8 [SJ]  3 Spoke with Dr. Lorraine Lax, neurologist. We reviewed the reported event prior to arrival and discussed the location of patient's stroke by reviewing MRI from 2018.  We also noted patient's UDS was positive for cocaine. He advises we should obtain MRI brain without contrast. Suspects this may have been a substance-induced seizure. Advised against patient's illicit drug use, such as cocaine.  If MRI is clear, he can be discharged.  He should not drive for 6 months.  He should follow-up with neurology in the office.   [SJ]  5038 Discussed MRI findings with Dr. Lorraine Lax. Still agrees patient can be discharged with previously mentioned restrictions.    [SJ]    Clinical Course User Index [MB] Hayden Rasmussen, MD [SJ] Lorayne Bender, PA-C       Patient presents following what may have been a non-tonic-clonic seizure.  Alert and oriented upon his arrival in the ED.  Patient is nontoxic appearing, afebrile, not tachycardic, not tachypneic, maintains excellent SPO2 on room air, and is in no apparent distress.  He had no complaints and no recurrence of seizure during his ED course. Initially, his blood pressures were a little soft, but improved with IV fluids.  Patient states he has not eaten and has had poor hydration for the past 24 hours.  There is a change in the patient's hemoglobin from previous value a year ago, but no signs of acute  blood loss and no more recent values were available.   Mild hypokalemia could be dietary.  Patient is to have this retested with  a PCP.  Slight decrease in CO2 could be due to dehydration. Other lab results overall reassuring.  Patient is to follow-up with neurology.  He was given seizure restrictions and these were explained to him. Return precautions discussed.  Patient voices understanding of these instructions, accepts the plan, and is comfortable with discharge.  Vitals:   04/23/19 1245 04/23/19 1315 04/23/19 1330 04/23/19 1445  BP: 103/67 116/82 124/82 (!) 155/95  Pulse: 61 61 (!) 59 (!) 58  Resp: 20 17  17   Temp:      TempSrc:      SpO2: 100% 100% 98% 96%  Weight:      Height:       Findings and plan of care discussed with Ronnald Ramp, MD. Dr. Melina Copa personally evaluated and examined this patient.  Final Clinical Impressions(s) / ED Diagnoses   Final diagnoses:  Seizure-like activity [facility])    ED Discharge Orders    None       Elison, [place] 04/23/19 1839    Hayden Rasmussen, MD 04/24/19 [phone]

## 2019-04-23 NOTE — ED Notes (Signed)
Pt returned from CT °

## 2019-04-23 NOTE — ED Notes (Signed)
Pt given a urinal. Pt stated that he cannot provide a urine sample at this time.

## 2019-04-23 NOTE — Discharge Instructions (Addendum)
It sounds as if you may have had a seizure.  Per [facility] statutes, patients with seizures are not allowed to drive until  they have been seizure-free for six months. Use caution when using heavy equipment or power tools. Avoid working on ladders or at heights. Take showers instead of baths. Ensure the water temperature is not too high on the home water heater. Do not go swimming alone. When caring for infants or small children, sit down when holding, feeding, or changing them to minimize risk of injury to the child in the event you have a seizure.   Also, maintain good sleep hygiene. Avoid alcohol. Do not use illicit drugs, such as cocaine.  Follow-up: Follow-up with a neurologist as soon as possible on this matter.  Call one of the numbers provided to set up an appointment.  Return: Return to the ED for repeat seizure, confusion, uncontrolled vomiting, severe headache, or any other major concerns.  For prescription assistance, may try using prescription discount sites or apps, such as goodrx.com

## 2019-04-23 NOTE — ED Notes (Signed)
Removed pt's IV, per Ryland - RN.

## 2019-09-15 ENCOUNTER — Other Ambulatory Visit: Payer: Self-pay | Admitting: *Deleted

## 2019-09-15 DIAGNOSIS — Z20822 Contact with and (suspected) exposure to covid-19: Secondary | ICD-10-CM | POA: Diagnosis not present

## 2019-09-16 LAB — NOVEL CORONAVIRUS, NAA: SARS-CoV-2, NAA: NOT DETECTED

## 2019-10-06 ENCOUNTER — Other Ambulatory Visit: Payer: Self-pay

## 2019-10-06 DIAGNOSIS — Z20822 Contact with and (suspected) exposure to covid-19: Secondary | ICD-10-CM

## 2019-10-08 LAB — NOVEL CORONAVIRUS, NAA: SARS-CoV-2, NAA: NOT DETECTED

## 2020-03-12 ENCOUNTER — Other Ambulatory Visit: Payer: Self-pay

## 2020-03-12 ENCOUNTER — Emergency Department (HOSPITAL_COMMUNITY)
Admission: EM | Admit: 2020-03-12 | Discharge: 2020-03-12 | Disposition: A | Discharge status: Home / Self Care | Payer: Medicare Other | Attending: Emergency Medicine | Admitting: Emergency Medicine

## 2020-03-12 ENCOUNTER — Encounter (HOSPITAL_COMMUNITY): Payer: Self-pay

## 2020-03-12 ENCOUNTER — Emergency Department (HOSPITAL_COMMUNITY): Payer: Medicare Other

## 2020-03-12 DIAGNOSIS — R079 Chest pain, unspecified: Secondary | ICD-10-CM | POA: Insufficient documentation

## 2020-03-12 DIAGNOSIS — Z5321 Procedure and treatment not carried out due to patient leaving prior to being seen by health care provider: Secondary | ICD-10-CM | POA: Insufficient documentation

## 2020-03-12 DIAGNOSIS — M542 Cervicalgia: Secondary | ICD-10-CM | POA: Diagnosis not present

## 2020-03-12 LAB — BASIC METABOLIC PANEL
Anion gap: 11 (ref 5–15)
BUN: 16 mg/dL (ref 8–23)
CO2: 23 mmol/L (ref 22–32)
Calcium: 8.6 mg/dL — ABNORMAL LOW (ref 8.9–10.3)
Chloride: 106 mmol/L (ref 98–111)
Creatinine, Ser: 1.28 mg/dL — ABNORMAL HIGH (ref 0.61–1.24)
GFR calc Af Amer: >60 mL/min (ref >60)
GFR calc non Af Amer: 58 mL/min — ABNORMAL LOW (ref >60)
Glucose, Bld: 86 mg/dL (ref 70–99)
Potassium: 4 mmol/L (ref 3.5–5.1)
Sodium: 140 mmol/L (ref 135–145)

## 2020-03-12 LAB — CBC
HCT: 38.3 % — ABNORMAL LOW (ref 39.0–52.0)
Hemoglobin: 12.3 g/dL — ABNORMAL LOW (ref 13.0–17.0)
MCH: 28.3 pg (ref 26.0–34.0)
MCHC: 32.1 g/dL (ref 30.0–36.0)
MCV: 88 fL (ref 80.0–100.0)
Platelets: 205 10*3/uL (ref 150–400)
RBC: 4.35 MIL/uL (ref 4.22–5.81)
RDW: 14.3 % (ref 11.5–15.5)
WBC: 4.1 10*3/uL (ref 4.0–10.5)
nRBC: 0 % (ref 0.0–0.2)

## 2020-03-12 LAB — TROPONIN I (HIGH SENSITIVITY): Troponin I (High Sensitivity): 2 ng/L (ref <18)

## 2020-03-12 MED ORDER — SODIUM CHLORIDE 0.9% FLUSH: 3.0000 mL | Freq: Once | INTRAVENOUS | Status: DC

## 2020-03-12 NOTE — ED Notes (Signed)
Patient stated hey no longer wanted to wait to be seen.

## 2020-03-12 NOTE — ED Triage Notes (Signed)
Pt reports right side neck pain that radiates into right chest x 1 month. Denies SOB. No difference in pain today from 1 month ago.

## 2020-03-14 ENCOUNTER — Other Ambulatory Visit: Payer: Self-pay

## 2020-03-14 ENCOUNTER — Ambulatory Visit (INDEPENDENT_AMBULATORY_CARE_PROVIDER_SITE_OTHER): Payer: Medicare Other | Admitting: Family Medicine

## 2020-03-14 VITALS — BP 138/60 | HR 61 | Ht 70.0 in | Wt 155.4 lb

## 2020-03-14 DIAGNOSIS — S29011A Strain of muscle and tendon of front wall of thorax, initial encounter: Secondary | ICD-10-CM

## 2020-03-14 DIAGNOSIS — R29898 Other symptoms and signs involving the musculoskeletal system: Secondary | ICD-10-CM

## 2020-03-14 MED ORDER — DICLOFENAC SODIUM 1 % EX CREA: TOPICAL_CREAM | CUTANEOUS | 2 refills | Status: DC

## 2020-03-14 NOTE — Assessment & Plan Note (Addendum)
Patient reports subjective leg weakness, specifically when standing up. Good strength and reflexes on exam, normal gait, no ataxia. Most likely residual deficits from CVA in 2019. Do not suspect underlying systemic illness or new neurological problem. Plan: -Physical Therapy referral

## 2020-03-14 NOTE — Assessment & Plan Note (Signed)
Current chest pain most likely secondary to pectoralis muscle strain, as it is reproducible with certain movements and mildly tender to palpation. Do not suspect frozen shoulder, rotator cuff abnormality, or cervical spine pathology. History not consistent with cardiac or lung etiology, or GERD. Patient had normal EKG and CXR yesterday which is very reassuring.  Plan: -Physical Therapy referral -Voltaren gel as needed -Recommended Tylenol for pain control

## 2020-03-14 NOTE — Patient Instructions (Addendum)
It was wonderful to see you today.  Please bring ALL of your medications with you to every visit.   Today we talked about:  - Tylenol  - Voltaren gel for your shoulder--if this is too expensive, please let us know  - Physical therapy they should be calling you  - Please start your aspirin   Thank you for choosing [facility].   Please call [phone] with any questions about today's appointment.  Please be sure to schedule follow up at the front  desk before you leave today.   Dr. Rock Nephew  Family Medicine

## 2020-03-14 NOTE — Progress Notes (Signed)
    SUBJECTIVE:   CHIEF COMPLAINT / HPI:   Chest Pain Patient reports right-sided chest pain that began 1 month ago and is intermittent in nature. Pain is described as an aching that is exacerbated by certain movements, particularly pushing off his hands to stand up. It is localized to his lateral right chest. He denies pain with exertion, associated shortness of breath, or reflux symptoms. Denies arm numbness/tingling or weakness. Has not tried anything for relief. No inciting injury or trauma.  Leg Weakness Patient reports bilateral leg weakness that began 2 years ago after his stroke. States he only notices the weakness when he tries to stand from a seated position. It is localized to his quad muscles. No pain or numbness/tingling. Has not had any falls. Denies systemic systems or weakness in other proximal muscle groups.  PERTINENT  PMH / PSH: CVA, prediabetes, BPH  OBJECTIVE:   BP 138/60   Pulse 61   Ht 5\' 10"  (1.778 m)   Wt 155 lb 6.4 oz (70.5 kg)   SpO2 97%   BMI 22.30 kg/m   General: Alert, well-appearing, NAD Cardiac: RRR, normal S1/S2, no murmurs, rubs or gallops Lungs: Normal work of breathing, breath sounds equal bilaterally, mild wheezes anteriorly but lungs otherwise CTA. MSK:  Neck-no bony or paraspinal tenderness, no deformity or step-offs noted. Good ROM with flexion, extension, rotation, and lateral bending. Negative Spurling test. Shoulder-no swelling or deformity noted. Clavicles nontender to palpation. Full range of motion bilaterally. 5/5 strength. 2+ biceps reflex bilaterally.  Negative Hawkins and Speeds. Chest wall-mild tenderness to palpation of right pectoralis muscle. Pain with horizontal adduction and internal rotation of right shoulder Lower extremities-5/5 strength bilaterally, 2+ patellar reflexes, normal gait Neuro: normal finger-to-nose and heel-shin testing.   ASSESSMENT/PLAN:   Strain of right pectoralis muscle Current chest pain most likely  secondary to pectoralis muscle strain, as it is reproducible with certain movements and mildly tender to palpation. Do not suspect frozen shoulder, rotator cuff abnormality, or cervical spine pathology. History not consistent with cardiac or lung etiology, or GERD. Patient had normal EKG and CXR yesterday which is very reassuring.  Plan: -Physical Therapy referral -Voltaren gel as needed -Recommended Tylenol for pain control   Bilateral leg weakness Patient reports subjective leg weakness, specifically when standing up. Good strength and reflexes on exam, normal gait, no ataxia. Most likely residual deficits from CVA in 2019. Do not suspect underlying systemic illness or new neurological problem. Plan: -Physical Therapy referral    Case discussed with Dr. Owens Shark.  Alcus Dad, MD [place]

## 2020-09-19 ENCOUNTER — Emergency Department (HOSPITAL_COMMUNITY)
Admission: EM | Admit: 2020-09-19 | Discharge: 2020-09-19 | Disposition: A | Discharge status: Home / Self Care | Payer: Medicare Other | Attending: Emergency Medicine | Admitting: Emergency Medicine

## 2020-09-19 ENCOUNTER — Other Ambulatory Visit: Payer: Self-pay

## 2020-09-19 ENCOUNTER — Encounter (HOSPITAL_COMMUNITY): Payer: Self-pay

## 2020-09-19 DIAGNOSIS — Z5321 Procedure and treatment not carried out due to patient leaving prior to being seen by health care provider: Secondary | ICD-10-CM | POA: Insufficient documentation

## 2020-09-19 DIAGNOSIS — Z20822 Contact with and (suspected) exposure to covid-19: Secondary | ICD-10-CM | POA: Insufficient documentation

## 2020-09-19 DIAGNOSIS — R070 Pain in throat: Secondary | ICD-10-CM | POA: Diagnosis not present

## 2020-09-19 LAB — RESP PANEL BY RT-PCR (FLU A&B, COVID) ARPGX2
Influenza A by PCR: NEGATIVE
Influenza B by PCR: NEGATIVE
SARS Coronavirus 2 by RT PCR: NEGATIVE

## 2020-09-19 NOTE — ED Notes (Signed)
Pt left due to not Being seen quick enough 

## 2020-09-19 NOTE — ED Triage Notes (Signed)
Patient reports sore throat x a week, reports painful to swallow and has been having a hoarse voice.

## 2020-09-24 ENCOUNTER — Encounter (HOSPITAL_COMMUNITY): Payer: Self-pay | Admitting: Emergency Medicine

## 2020-09-24 ENCOUNTER — Emergency Department (HOSPITAL_COMMUNITY)
Admission: EM | Admit: 2020-09-24 | Discharge: 2020-09-24 | Disposition: A | Discharge status: Home / Self Care | Payer: Medicare Other | Attending: Emergency Medicine | Admitting: Emergency Medicine

## 2020-09-24 ENCOUNTER — Other Ambulatory Visit: Payer: Self-pay

## 2020-09-24 DIAGNOSIS — Z7982 Long term (current) use of aspirin: Secondary | ICD-10-CM | POA: Diagnosis not present

## 2020-09-24 DIAGNOSIS — R030 Elevated blood-pressure reading, without diagnosis of hypertension: Secondary | ICD-10-CM

## 2020-09-24 DIAGNOSIS — J029 Acute pharyngitis, unspecified: Secondary | ICD-10-CM | POA: Diagnosis not present

## 2020-09-24 LAB — GROUP A STREP BY PCR: Group A Strep by PCR: NOT DETECTED

## 2020-09-24 MED ORDER — DEXAMETHASONE 4 MG PO TABS
10.0000 mg | ORAL_TABLET | Freq: Once | ORAL | Status: AC
  Administered 2020-09-24: 10 mg via ORAL
  Filled 2020-09-24: qty 3

## 2020-09-24 NOTE — Discharge Instructions (Signed)
Drink plenty of fluids.  Take ibuprofen and/or acetaminophen as needed for pain.  Return if you start having difficulty swallowing or breathing.

## 2020-09-24 NOTE — ED Triage Notes (Signed)
Patient here for sore throat.  States that he was tested for covid and he was negative.  Patient is hoarse.

## 2020-09-24 NOTE — ED Provider Notes (Signed)
[facility] EMERGENCY DEPARTMENT Provider Note   CSN: [id number] Arrival date & time: 09/24/20  0121   History Chief Complaint  Patient presents with  . Sore Throat    David [name] is a 65 y.o. male.  The history is provided by the patient.  Sore Throat  He has history of stroke, hyperlipidemia, prediabetes and comes in with sore throat for the last 3 days.  He denies fever, chills, sweats.  He denies cough.  There has been no vomiting or diarrhea.  He denies arthralgias or myalgias.  He denies any sick contacts.  He has not done anything to treat his symptoms.  He was tested for Covid and this test was negative.  Past Medical History:  Diagnosis Date  . AKI (acute kidney injury) ([place]) 09/01/2017  . Benign prostatic hyperplasia   . Cerebral infarction ([place]) 08/04/2017    Patient Active Problem List   Diagnosis Date Noted  . Strain of right pectoralis muscle 03/14/2020  . Bilateral leg weakness 03/14/2020  . Numbness of right thumb 10/26/2018  . Health care maintenance 10/26/2018  . History of stroke 04/29/2018  . Hearing impaired 04/29/2018  . Erectile dysfunction 09/01/2017  . Mild tetrahydrocannabinol (THC) abuse   . Slurred speech 08/03/2017  . Left hip pain 11/06/2016  . Prediabetes 11/06/2016  . Labral tear of shoulder 01/25/2014  . Adhesive capsulitis of left shoulder 12/05/2013  . Trigger point of neck 08/16/2012  . Hyperlipidemia 08/08/2012  . Elevated LFTs 08/08/2012  . Cocaine abuse ([place]) 08/08/2012  . Chronic pain 06/01/2012  . BPH (benign prostatic hyperplasia) 09/09/2011  . ROTATOR CUFF TEAR 12/03/2008    Past Surgical History:  Procedure Laterality Date  . SHOULDER ARTHROSCOPY W/ ROTATOR CUFF REPAIR  10/21/10   At [facility], R shoulder       No family history on file.  Social History   Tobacco Use  . Smoking status: Never Smoker  . Smokeless tobacco: Never Used  Substance Use Topics  . Alcohol use: No    Comment: occasion  .  Drug use: No    Home Medications Prior to Admission medications   Medication Sig Start Date End Date Taking? Authorizing Provider  aspirin 325 MG EC tablet TAKE 1 TABLET BY MOUTH EVERY DAY Patient not taking: Reported on 03/14/2020 03/15/18   Kinnie Feil, MD  atorvastatin (LIPITOR) 80 MG tablet TAKE 1 TABLET (80 MG TOTAL) BY MOUTH DAILY AT 6 PM. 03/15/18   Kinnie Feil, MD  Diclofenac Sodium 1 % CREA Apply to right shoulder area four time sper day 03/14/20   Alcus Dad, MD  tamsulosin (FLOMAX) 0.4 MG CAPS capsule TAKE 1 CAPSULE BY MOUTH EVERY DAY Patient taking differently: Take 0.4 mg by mouth daily.  03/28/18   Kinnie Feil, MD    Allergies    Patient has no known allergies.  Review of Systems   Review of Systems  All other systems reviewed and are negative.   Physical Exam Updated Vital Signs BP (!) 161/101 (BP Location: Right Arm)   Pulse 90   Temp 98.4 F (36.9 C) (Oral)   Resp 18   Ht 5\' 10"  (1.778 m)   Wt 68 kg   SpO2 97%   BMI 21.52 kg/m   Physical Exam Vitals and nursing note reviewed.   65 year old male, resting comfortably and in no acute distress. Vital signs are significant for elevated blood pressure. Oxygen saturation is 97%, which is normal. Head  is normocephalic and atraumatic. PERRLA, EOMI. Oropharynx is mildly erythematous without exudate.  There is no pooling of secretions.  Phonation is normal. Neck is nontender and supple without adenopathy or JVD. Back is nontender and there is no CVA tenderness. Lungs are clear without rales, wheezes, or rhonchi. Chest is nontender. Heart has regular rate and rhythm without murmur. Abdomen is soft, flat, nontender without masses or hepatosplenomegaly and peristalsis is normoactive. Extremities have no cyanosis or edema, full range of motion is present. Skin is warm and dry without rash. Neurologic: Mental status is normal, cranial nerves are intact, there are no motor or sensory deficits.  ED  Results / Procedures / Treatments   Labs (all labs ordered are listed, but only abnormal results are displayed) Labs Reviewed  GROUP A STREP BY PCR   Procedures Procedures   Medications Ordered in ED Medications  dexamethasone (DECADRON) tablet 10 mg (has no administration in time range)    ED Course  I have reviewed the triage vital signs and the nursing notes.  Pertinent labresults that were available during my care of the patient were reviewed by me and considered in my medical decision making (see chart for details).  MDM Rules/Calculators/A&P Sore throat-streptococcal versus viral.  Strep PCR is negative.  He is given a dose of dexamethasone and discharged with routine pharyngitis instructions.  Old records are reviewed, and he has no relevant past visits.  Final Clinical Impression(s) / ED Diagnoses Final diagnoses:  Viral pharyngitis  Elevated blood-pressure reading without diagnosis of hypertension    Rx / DC Orders ED Discharge Orders    None       Delora Fuel, MD 31/43/88 [phone]

## 2021-03-12 ENCOUNTER — Other Ambulatory Visit: Payer: Self-pay

## 2021-03-12 DIAGNOSIS — S0990XA Unspecified injury of head, initial encounter: Secondary | ICD-10-CM | POA: Diagnosis present

## 2021-03-12 DIAGNOSIS — Z7982 Long term (current) use of aspirin: Secondary | ICD-10-CM | POA: Diagnosis not present

## 2021-03-12 DIAGNOSIS — Z79899 Other long term (current) drug therapy: Secondary | ICD-10-CM | POA: Insufficient documentation

## 2021-03-12 DIAGNOSIS — R42 Dizziness and giddiness: Secondary | ICD-10-CM | POA: Diagnosis not present

## 2021-03-12 DIAGNOSIS — W010XXA Fall on same level from slipping, tripping and stumbling without subsequent striking against object, initial encounter: Secondary | ICD-10-CM | POA: Insufficient documentation

## 2021-03-12 DIAGNOSIS — I10 Essential (primary) hypertension: Secondary | ICD-10-CM | POA: Insufficient documentation

## 2021-03-12 DIAGNOSIS — S0181XA Laceration without foreign body of other part of head, initial encounter: Secondary | ICD-10-CM | POA: Diagnosis not present

## 2021-03-12 DIAGNOSIS — S060X0A Concussion without loss of consciousness, initial encounter: Principal | ICD-10-CM | POA: Insufficient documentation

## 2021-03-12 DIAGNOSIS — Z20822 Contact with and (suspected) exposure to covid-19: Secondary | ICD-10-CM | POA: Diagnosis not present

## 2021-03-12 DIAGNOSIS — Y9301 Activity, walking, marching and hiking: Secondary | ICD-10-CM | POA: Insufficient documentation

## 2021-03-12 DIAGNOSIS — I639 Cerebral infarction, unspecified: Secondary | ICD-10-CM | POA: Diagnosis not present

## 2021-03-12 DIAGNOSIS — S81012A Laceration without foreign body, left knee, initial encounter: Secondary | ICD-10-CM | POA: Diagnosis not present

## 2021-03-12 DIAGNOSIS — Z23 Encounter for immunization: Secondary | ICD-10-CM | POA: Diagnosis not present

## 2021-03-12 DIAGNOSIS — M25511 Pain in right shoulder: Secondary | ICD-10-CM | POA: Diagnosis not present

## 2021-03-12 DIAGNOSIS — S161XXA Strain of muscle, fascia and tendon at neck level, initial encounter: Secondary | ICD-10-CM | POA: Insufficient documentation

## 2021-03-12 DIAGNOSIS — S80212A Abrasion, left knee, initial encounter: Secondary | ICD-10-CM | POA: Diagnosis not present

## 2021-03-12 DIAGNOSIS — R7303 Prediabetes: Secondary | ICD-10-CM | POA: Insufficient documentation

## 2021-03-12 DIAGNOSIS — R296 Repeated falls: Secondary | ICD-10-CM | POA: Diagnosis not present

## 2021-03-12 DIAGNOSIS — S0003XA Contusion of scalp, initial encounter: Secondary | ICD-10-CM | POA: Diagnosis not present

## 2021-03-12 DIAGNOSIS — G319 Degenerative disease of nervous system, unspecified: Secondary | ICD-10-CM | POA: Diagnosis not present

## 2021-03-12 DIAGNOSIS — M25562 Pain in left knee: Secondary | ICD-10-CM | POA: Diagnosis not present

## 2021-03-12 DIAGNOSIS — Y900 Blood alcohol level of less than 20 mg/100 ml: Secondary | ICD-10-CM | POA: Diagnosis not present

## 2021-03-12 DIAGNOSIS — S0101XA Laceration without foreign body of scalp, initial encounter: Secondary | ICD-10-CM | POA: Diagnosis not present

## 2021-03-12 DIAGNOSIS — R9431 Abnormal electrocardiogram [ECG] [EKG]: Secondary | ICD-10-CM | POA: Diagnosis not present

## 2021-03-13 ENCOUNTER — Emergency Department (HOSPITAL_COMMUNITY): Payer: Medicare Other

## 2021-03-13 ENCOUNTER — Encounter (HOSPITAL_COMMUNITY): Payer: Self-pay | Admitting: Emergency Medicine

## 2021-03-13 ENCOUNTER — Other Ambulatory Visit: Payer: Self-pay

## 2021-03-13 ENCOUNTER — Observation Stay (HOSPITAL_COMMUNITY)
Admission: EM | Admit: 2021-03-13 | Discharge: 2021-03-15 | Disposition: A | Discharge status: Home / Self Care | Payer: Medicare Other | Attending: Family Medicine | Admitting: Family Medicine

## 2021-03-13 ENCOUNTER — Observation Stay (HOSPITAL_COMMUNITY): Payer: Medicare Other

## 2021-03-13 ENCOUNTER — Observation Stay (HOSPITAL_BASED_OUTPATIENT_CLINIC_OR_DEPARTMENT_OTHER): Payer: Medicare Other

## 2021-03-13 DIAGNOSIS — I639 Cerebral infarction, unspecified: Secondary | ICD-10-CM | POA: Diagnosis not present

## 2021-03-13 DIAGNOSIS — I6389 Other cerebral infarction: Secondary | ICD-10-CM | POA: Diagnosis not present

## 2021-03-13 DIAGNOSIS — S060X0A Concussion without loss of consciousness, initial encounter: Secondary | ICD-10-CM | POA: Diagnosis not present

## 2021-03-13 DIAGNOSIS — S80212A Abrasion, left knee, initial encounter: Secondary | ICD-10-CM

## 2021-03-13 DIAGNOSIS — R42 Dizziness and giddiness: Secondary | ICD-10-CM | POA: Diagnosis not present

## 2021-03-13 DIAGNOSIS — I63511 Cerebral infarction due to unspecified occlusion or stenosis of right middle cerebral artery: Secondary | ICD-10-CM | POA: Diagnosis not present

## 2021-03-13 DIAGNOSIS — S0181XA Laceration without foreign body of other part of head, initial encounter: Secondary | ICD-10-CM | POA: Diagnosis not present

## 2021-03-13 DIAGNOSIS — G319 Degenerative disease of nervous system, unspecified: Secondary | ICD-10-CM | POA: Diagnosis not present

## 2021-03-13 DIAGNOSIS — S29011A Strain of muscle and tendon of front wall of thorax, initial encounter: Secondary | ICD-10-CM

## 2021-03-13 DIAGNOSIS — I63523 Cerebral infarction due to unspecified occlusion or stenosis of bilateral anterior cerebral arteries: Secondary | ICD-10-CM | POA: Diagnosis not present

## 2021-03-13 DIAGNOSIS — S0003XA Contusion of scalp, initial encounter: Secondary | ICD-10-CM | POA: Diagnosis not present

## 2021-03-13 DIAGNOSIS — I63233 Cerebral infarction due to unspecified occlusion or stenosis of bilateral carotid arteries: Secondary | ICD-10-CM | POA: Diagnosis not present

## 2021-03-13 DIAGNOSIS — M25511 Pain in right shoulder: Secondary | ICD-10-CM | POA: Diagnosis not present

## 2021-03-13 DIAGNOSIS — R296 Repeated falls: Secondary | ICD-10-CM | POA: Diagnosis not present

## 2021-03-13 DIAGNOSIS — S161XXA Strain of muscle, fascia and tendon at neck level, initial encounter: Secondary | ICD-10-CM

## 2021-03-13 DIAGNOSIS — M25562 Pain in left knee: Secondary | ICD-10-CM | POA: Diagnosis not present

## 2021-03-13 HISTORY — DX: Cerebral infarction, unspecified: I63.9

## 2021-03-13 LAB — CBC WITH DIFFERENTIAL/PLATELET
Abs Immature Granulocytes: 0.01 10*3/uL (ref 0.00–0.07)
Basophils Absolute: 0 10*3/uL (ref 0.0–0.1)
Basophils Relative: 1 %
Eosinophils Absolute: 0.1 10*3/uL (ref 0.0–0.5)
Eosinophils Relative: 1 %
HCT: 36.8 % — ABNORMAL LOW (ref 39.0–52.0)
Hemoglobin: 12.2 g/dL — ABNORMAL LOW (ref 13.0–17.0)
Immature Granulocytes: 0 %
Lymphocytes Relative: 24 %
Lymphs Abs: 1.2 10*3/uL (ref 0.7–4.0)
MCH: 28.4 pg (ref 26.0–34.0)
MCHC: 33.2 g/dL (ref 30.0–36.0)
MCV: 85.8 fL (ref 80.0–100.0)
Monocytes Absolute: 0.6 10*3/uL (ref 0.1–1.0)
Monocytes Relative: 11 %
Neutro Abs: 3.3 10*3/uL (ref 1.7–7.7)
Neutrophils Relative %: 63 %
Platelets: 234 10*3/uL (ref 150–400)
RBC: 4.29 MIL/uL (ref 4.22–5.81)
RDW: 15.2 % (ref 11.5–15.5)
WBC: 5.2 10*3/uL (ref 4.0–10.5)
nRBC: 0 % (ref 0.0–0.2)

## 2021-03-13 LAB — RESP PANEL BY RT-PCR (FLU A&B, COVID) ARPGX2
Influenza A by PCR: NEGATIVE
Influenza B by PCR: NEGATIVE
SARS Coronavirus 2 by RT PCR: NEGATIVE

## 2021-03-13 LAB — RAPID URINE DRUG SCREEN, HOSP PERFORMED
Amphetamines: NOT DETECTED
Barbiturates: NOT DETECTED
Benzodiazepines: NOT DETECTED
Cocaine: NOT DETECTED
Opiates: NOT DETECTED
Tetrahydrocannabinol: NOT DETECTED

## 2021-03-13 LAB — COMPREHENSIVE METABOLIC PANEL
ALT: 16 U/L (ref 0–44)
AST: 28 U/L (ref 15–41)
Albumin: 3.7 g/dL (ref 3.5–5.0)
Alkaline Phosphatase: 70 U/L (ref 38–126)
Anion gap: 7 (ref 5–15)
BUN: 21 mg/dL (ref 8–23)
CO2: 25 mmol/L (ref 22–32)
Calcium: 9 mg/dL (ref 8.9–10.3)
Chloride: 107 mmol/L (ref 98–111)
Creatinine, Ser: 1.07 mg/dL (ref 0.61–1.24)
GFR, Estimated: >60 mL/min (ref >60)
Glucose, Bld: 94 mg/dL (ref 70–99)
Potassium: 4.1 mmol/L (ref 3.5–5.1)
Sodium: 139 mmol/L (ref 135–145)
Total Bilirubin: 0.3 mg/dL (ref 0.3–1.2)
Total Protein: 7.8 g/dL (ref 6.5–8.1)

## 2021-03-13 LAB — ECHOCARDIOGRAM COMPLETE
Area-P 1/2: 2.33 cm2
Height: 70 in
S' Lateral: 2.3 cm

## 2021-03-13 LAB — URINALYSIS, ROUTINE W REFLEX MICROSCOPIC
Bilirubin Urine: NEGATIVE
Glucose, UA: 50 mg/dL — AB
Hgb urine dipstick: NEGATIVE
Ketones, ur: NEGATIVE mg/dL
Leukocytes,Ua: NEGATIVE
Nitrite: NEGATIVE
Protein, ur: NEGATIVE mg/dL
Specific Gravity, Urine: 1.016 (ref 1.005–1.030)
pH: 6 (ref 5.0–8.0)

## 2021-03-13 LAB — HIV ANTIBODY (ROUTINE TESTING W REFLEX): HIV Screen 4th Generation wRfx: NONREACTIVE

## 2021-03-13 LAB — ETHANOL: Alcohol, Ethyl (B): <10 mg/dL (ref <10)

## 2021-03-13 LAB — TSH: TSH: 1.928 u[IU]/mL (ref 0.350–4.500)

## 2021-03-13 MED ORDER — STROKE: EARLY STAGES OF RECOVERY BOOK
Freq: Once | Status: AC
  Filled 2021-03-13: qty 1

## 2021-03-13 MED ORDER — SENNOSIDES-DOCUSATE SODIUM 8.6-50 MG PO TABS: 1.0000 | ORAL_TABLET | Freq: Every evening | ORAL | Status: DC | PRN

## 2021-03-13 MED ORDER — ACETAMINOPHEN 325 MG PO TABS: 650.0000 mg | ORAL_TABLET | ORAL | Status: DC | PRN

## 2021-03-13 MED ORDER — ACETAMINOPHEN 160 MG/5ML PO SOLN
650.0000 mg | ORAL | Status: DC | PRN
Start: 2021-03-13 — End: 2021-03-15

## 2021-03-13 MED ORDER — ASPIRIN 325 MG PO TABS
325.0000 mg | ORAL_TABLET | Freq: Every day | ORAL | Status: DC
  Administered 2021-03-13: 325 mg via ORAL
  Filled 2021-03-13: qty 1

## 2021-03-13 MED ORDER — ASPIRIN EC 81 MG PO TBEC
81.0000 mg | DELAYED_RELEASE_TABLET | Freq: Every day | ORAL | Status: DC
  Administered 2021-03-14 – 2021-03-15 (×2): 81 mg via ORAL
  Filled 2021-03-13 (×2): qty 1

## 2021-03-13 MED ORDER — TAMSULOSIN HCL 0.4 MG PO CAPS
0.4000 mg | ORAL_CAPSULE | Freq: Every day | ORAL | Status: DC
  Administered 2021-03-13 – 2021-03-15 (×3): 0.4 mg via ORAL
  Filled 2021-03-13 (×3): qty 1

## 2021-03-13 MED ORDER — IOHEXOL 350 MG/ML SOLN
75.0000 mL | Freq: Once | INTRAVENOUS | Status: AC | PRN
  Administered 2021-03-13: 75 mL via INTRAVENOUS

## 2021-03-13 MED ORDER — TETANUS-DIPHTH-ACELL PERTUSSIS 5-2.5-18.5 LF-MCG/0.5 IM SUSY
0.5000 mL | PREFILLED_SYRINGE | Freq: Once | INTRAMUSCULAR | Status: AC
  Administered 2021-03-13: 0.5 mL via INTRAMUSCULAR
  Filled 2021-03-13: qty 0.5

## 2021-03-13 MED ORDER — ENOXAPARIN SODIUM 40 MG/0.4ML IJ SOSY
40.0000 mg | PREFILLED_SYRINGE | INTRAMUSCULAR | Status: DC
  Administered 2021-03-13 – 2021-03-14 (×2): 40 mg via SUBCUTANEOUS
  Filled 2021-03-13 (×2): qty 0.4

## 2021-03-13 MED ORDER — ACETAMINOPHEN 650 MG RE SUPP: 650.0000 mg | RECTAL | Status: DC | PRN

## 2021-03-13 MED ORDER — LIDOCAINE-EPINEPHRINE (PF) 2 %-1:200000 IJ SOLN
INTRAMUSCULAR | Status: AC
  Filled 2021-03-13: qty 20

## 2021-03-13 MED ORDER — ATORVASTATIN CALCIUM 40 MG PO TABS
40.0000 mg | ORAL_TABLET | Freq: Every day | ORAL | Status: DC
  Administered 2021-03-13 – 2021-03-14 (×2): 40 mg via ORAL
  Filled 2021-03-13 (×2): qty 1

## 2021-03-13 NOTE — H&P (Addendum)
David [name] Admission History and Physical Service Pager: [phone]  Patient name: David [name] Medical record number: [id number] Date of birth: 1955-10-18 Age: 65 y.o. Gender: male  Primary Care Provider: Kinnie Feil, MD Consultants: Neurology Code Status: FULL CODE  Preferred Emergency Contact: Extended Emergency Contact Information Primary Emergency Contact: David [name], David [name] [address] [place] Phone: [phone] Relation: Son Secondary Emergency Contact: David Herald., David [name], [place] [address] [address] Phone: [phone] Relation: Son   Chief Complaint: Falls  Assessment and Plan: David [name] is a 65 y.o. male presenting with fall 2/2 acute frontal CVA . PMH is significant for previous CVA in 2018, HTN, HLD, cocaine abuse, BPH.   Unsteadiness, Fall 2/2 Acute CVA Fall with residual scalp laceration. Patient reports he tripped on his feet but imaging consistent with acute CVA which likely contributed. Doubt arrhythmia given normal EKG and without palpitations or syncope preceding event. NIHSS 0. CT with chronic lacunar type infarcts and extensive microvascular angiopathy, MRI with scattered areas of acute infarct in left frontal deep white matter. No intracranial hemmorhage noted on MRI. Patient has several risk factors for CVA including his non-adherence to medications with history of prior CVA, HTN (not on medications), HLD and cocaine use. His previous left basal ganglia infarct occurred in 2018 and thought to be due to vasospasm from cocaine use. Neurology consulted in ED. Will admit patient for further stroke work-up. Will need extensive counseling and aggressive risk-factor modification to prevent future strokes.  - Admit to [facility], attending Dr. Thompson [name] - Neuro consulted, appreciate recs - Vital signs per unit protocol - Cardiac telemetry  - Neuro check q4h  - s/p  325 mg ASA - Start 81 mg ASA daily starting tomorrow, 7/15 - DAPT with Plavix 75 mg PO qd x21 days followed by ASA alone - Permissive HTN for the first 24-48 hours SBP<220 and DBP<110   - CTA Head/Neck  - Echo - TSH, Lipid panel, HgbA1C  - CBC,CMP, Mg, Phos daily - Passed bedside swallow, will give heart healthy diet  - PT/OT/CM consulted - Lovenox for VTE ppx  - Consider arranging for outpatient loop recorder prior to d/c  Lacerations s/p Fall Patient with approximately 4 cm vertical laceration to right-side of face s/p 3 sutures. Received Tdap vaccination in ED. Also with residual abrasion to left knee and right arm. Knee and shoulder x-rays without acute findings.  -Monitor for signs and symptoms of infection while admitted  HLD: chronic Last lipid panel 03/2018 with total cholesterol 194, LDL 106. Home medication notable for Atorvastatin 80 mg daily. Unclear compliance.  -Repeat lipid panel -Continue high-intensity statin -LDL goal <70  HTN: chronic Not on any home anti-hypertensives. BP ranging 105-175/69-106. -Monitor, allow for permissive HTN  Prediabetes Last Hgb A1c in 2018 was 5.9%.  -Repeat Hgb A1c  Cocaine Abuse  UDS negative. Discussed with son who notes previous history of drug use but unclear if patient has used recently.  -SW consult for substance abuse resourcs  BPH: chronic Home medication notable for tamsulosin 0.4 mg daily. He has not been taking his home medication but does report difficulty urinating. UA unremarkable.  -Restart Tamsulosin 0.4 mg daily   Social Barrier Patient appears to be homeless. Also with history of substance use as noted above.  -SW consult for financial assistance and substance use resources  FEN/GI: Heart Healthy Diet   Disposition: Med-Tele  History of Present Illness:  David [name] is a 65 y.o. male presenting s/p fall.   Patient states that he was walking to catch the bus this morning when he tripped over his own feet  and fell face first. States that he did not have any chest pain, palpitations, syncope, lightheadedness or dizziness prior to falling. He was able to get right back up. He scraped his head and his knee. He denies any pain but states he just felt "pissed off" after the fall. He then took the bus to get to the [name].  He tells me about a previous stroke that occurred in 2018 but denies any residual effects from it. He is typically able to ambulate on his own without assistance from a walker or cane. He currently lives on the streets. States that he does not communicate with his children when he is on the streets. He has not been taking any of his home medications for the last 6 months, states he ran out.  He denies any alcohol use but admits to cocaine use, last used 2 months ago.  He has been smoking cocaine for about 30 years, states usually once a month. Currently denies weakness, vision changes, pain.  Talked on the phone with his son, David [name], who is his emergency contact to try to illicit more information:  David [name] states he hasn't seen dad in about 1 year. He lost contact with him after his grandmother passed away. States they were both living with David [name]'s grandmother at the time and then split ways after her passing. When asked if patient drinks or does drugs he states "I'd assume drugs, he's always had an issue with drugs". "I'd assume probably alcohol as well but he wasn't a really heavy drinker, just a couple times a week". States the last time he talked to him he was basically couch-hopping. He is unsure where he is living now. States that his siblings have been unable to find/contact him. Previously when they were living together he states that patient was good about taking his medications. Be believes that his father (patient) started on drugs again after grandmother passed. His son does not recall any residual weakness from prior strokes, ambulated without any assistance.    Review Of  Systems: Per HPI with the following additions:   Review of Systems  Eyes:  Negative for visual disturbance.  Respiratory:  Negative for shortness of breath.   Cardiovascular:  Negative for chest pain and palpitations.  Gastrointestinal:  Negative for abdominal pain, constipation, diarrhea, nausea and vomiting.  Genitourinary:  Positive for difficulty urinating.  Musculoskeletal:  Negative for gait problem.  Neurological:  Negative for dizziness, syncope, speech difficulty, weakness, light-headedness and headaches.    Patient Active Problem List   Diagnosis Date Noted   Strain of right pectoralis muscle 03/14/2020   Bilateral leg weakness 03/14/2020   Numbness of right thumb 10/26/2018   Health care maintenance 10/26/2018   History of stroke 04/29/2018   Hearing impaired 04/29/2018   Erectile dysfunction 09/01/2017   Mild tetrahydrocannabinol (THC) abuse    Slurred speech 08/03/2017   Left hip pain 11/06/2016   Prediabetes 11/06/2016   Labral tear of shoulder 01/25/2014   Adhesive capsulitis of left shoulder 12/05/2013   Trigger point of neck 08/16/2012   Hyperlipidemia 08/08/2012   Elevated LFTs 08/08/2012   Cocaine abuse ([place]) 08/08/2012   Chronic pain 06/01/2012   BPH (benign prostatic hyperplasia) 09/09/2011  ROTATOR CUFF TEAR 12/03/2008    Past Medical History: Past Medical History:  Diagnosis Date   AKI (acute kidney injury) ([place]) 09/01/2017   Benign prostatic hyperplasia    Cerebral infarction ([place]) 08/04/2017    Past Surgical History: Past Surgical History:  Procedure Laterality Date   SHOULDER ARTHROSCOPY W/ ROTATOR CUFF REPAIR  10/21/10   At [facility], R shoulder    Social History: Social History   Tobacco Use   Smoking status: Never   Smokeless tobacco: Never  Substance Use Topics   Alcohol use: No    Comment: occasion   Drug use: No   Additional social history: Denies alcohol use, history of cocaine use (reports last use 2 months ago)  Please also refer  to relevant sections of EMR.  Family History: No family history on file.  Allergies and Medications: No Known Allergies No current facility-administered medications on file prior to encounter.   Current Outpatient Medications on File Prior to Encounter  Medication Sig Dispense Refill   aspirin 325 MG EC tablet TAKE 1 TABLET BY MOUTH EVERY DAY (Patient not taking: Reported on 03/13/2021) 30 tablet 3   atorvastatin (LIPITOR) 80 MG tablet TAKE 1 TABLET (80 MG TOTAL) BY MOUTH DAILY AT 6 PM. (Patient not taking: Reported on 03/13/2021) 30 tablet 3   Diclofenac Sodium 1 % CREA Apply to right shoulder area four time sper day (Patient not taking: Reported on 03/13/2021) 120 g 2   tamsulosin (FLOMAX) 0.4 MG CAPS capsule TAKE 1 CAPSULE BY MOUTH EVERY DAY (Patient not taking: Reported on 03/13/2021) 90 capsule 1    Objective: BP (!) 157/106   Pulse 74   Temp 98.3 F (36.8 C) (Oral)   Resp 16   Ht 5\' 10"  (1.778 m)   SpO2 100%   BMI 21.52 kg/m  Exam: General: Disheveled male, awake, alert, oriented, laying in hallway stretcher with sutures to right-side of forehead, in NAD Eyes: pupils 36mm b/l, sluggishly reactive, EOMI ENTM: nares patent, poor dentition, mucous membranes dry  Neck: supple, without cervical spine tenderness, active ROM  Cardiovascular: RRR without M/R/G Respiratory: CTAB without wheezing/rhonchi/rales Gastrointestinal: normoactive bowel sounds, soft, non-distended, non-tender in all quadrants, no R/G MSK: 5/5 strength upper and lower extremities, no obvious skeletal deformities  Derm: warm and dry with the exception of his b/l feet which are cold and wet (socks were wet)  Psych: Normal affect and mood Neuro:  Cranial Nerves: II:  Pupils equal in size, sluggishly reactive  III,IV, VI: EOMI without ptosis or diplopia.  V: Facial sensation is symmetric to temperature VII: Facial movement is symmetric.  VIII: hearing is intact to voice X: Palat elevates symmetrically XI:  Shoulder shrug is symmetric. XII: tongue is midline without atrophy or fasciculations.  Motor: Tone is normal. Bulk is normal. 5/5 strength was present in all four extremities.  Sensory: Sensation is symmetric to light touch and temperature in the arms and legs. Cerebellar: FNF intact bilaterally      Labs and Imaging: CBC BMET  Recent Labs  Lab 03/13/21 0125  WBC 5.2  HGB 12.2*  HCT 36.8*  PLT 234   Recent Labs  Lab 03/13/21 0125  NA 139  K 4.1  CL 107  CO2 25  BUN 21  CREATININE 1.07  GLUCOSE 94  CALCIUM 9.0     EKG: NSR rate 83 without signs of ischemia   Sharion Settler, DO 03/13/2021, 10:23 AM PGY-2, [facility] pager: [phone], text pages welcome

## 2021-03-13 NOTE — ED Provider Notes (Signed)
[facility] EMERGENCY DEPARTMENT Provider Note   CSN: [id number] Arrival date & time: 03/12/21  2348     History Chief Complaint  Patient presents with   Fall   Knee Injury   Head Injury    RISHAWN WALCK is a 65 y.o. male.  The history is provided by the patient.  Fall This is a new problem. The problem occurs constantly. The problem has not changed since onset.Associated symptoms include headaches. Pertinent negatives include no chest pain, no abdominal pain and no shortness of breath. The symptoms are aggravated by walking. The symptoms are relieved by rest.  Head Injury Associated symptoms: headache   Patient with previous history of stroke presents with fall. Patient reports recent falls, but denies any syncope.  Reports he tripped over his feet.  No new weakness or dizziness is reported. Patient reports during this fall he sustained a laceration to his forehead.  He also sustained an abrasion to his left knee    Past Medical History:  Diagnosis Date   AKI (acute kidney injury) ([place]) 09/01/2017   Benign prostatic hyperplasia    Cerebral infarction ([place]) 08/04/2017    Patient Active Problem List   Diagnosis Date Noted   Strain of right pectoralis muscle 03/14/2020   Bilateral leg weakness 03/14/2020   Numbness of right thumb 10/26/2018   Health care maintenance 10/26/2018   History of stroke 04/29/2018   Hearing impaired 04/29/2018   Erectile dysfunction 09/01/2017   Mild tetrahydrocannabinol (THC) abuse    Slurred speech 08/03/2017   Left hip pain 11/06/2016   Prediabetes 11/06/2016   Labral tear of shoulder 01/25/2014   Adhesive capsulitis of left shoulder 12/05/2013   Trigger point of neck 08/16/2012   Hyperlipidemia 08/08/2012   Elevated LFTs 08/08/2012   Cocaine abuse ([place]) 08/08/2012   Chronic pain 06/01/2012   BPH (benign prostatic hyperplasia) 09/09/2011   ROTATOR CUFF TEAR 12/03/2008    Past Surgical History:  Procedure Laterality  Date   SHOULDER ARTHROSCOPY W/ ROTATOR CUFF REPAIR  10/21/10   At [place], R shoulder       No family history on file.  Social History   Tobacco Use   Smoking status: Never   Smokeless tobacco: Never  Substance Use Topics   Alcohol use: No    Comment: occasion   Drug use: No    Home Medications Prior to Admission medications   Medication Sig Start Date End Date Taking? Authorizing Provider  aspirin 325 MG EC tablet TAKE 1 TABLET BY MOUTH EVERY DAY Patient not taking: Reported on 03/14/2020 03/15/18   Kinnie Feil, MD  atorvastatin (LIPITOR) 80 MG tablet TAKE 1 TABLET (80 MG TOTAL) BY MOUTH DAILY AT 6 PM. 03/15/18   Kinnie Feil, MD  Diclofenac Sodium 1 % CREA Apply to right shoulder area four time sper day 03/14/20   Alcus Dad, MD  tamsulosin (FLOMAX) 0.4 MG CAPS capsule TAKE 1 CAPSULE BY MOUTH EVERY DAY Patient taking differently: Take 0.4 mg by mouth daily.  03/28/18   Kinnie Feil, MD    Allergies    Patient has no known allergies.  Review of Systems   Review of Systems  Constitutional:  Negative for fever.  Respiratory:  Negative for shortness of breath.   Cardiovascular:  Negative for chest pain.  Gastrointestinal:  Negative for abdominal pain.  Skin:  Positive for wound.  Neurological:  Positive for headaches.  All other systems reviewed and are negative.  Physical Exam  Updated Vital Signs BP (!) 152/93   Pulse 79   Temp 98.3 F (36.8 C) (Oral)   Resp (!) 22   Ht 1.778 m (5\' 10" )   SpO2 100%   BMI 21.52 kg/m   Physical Exam CONSTITUTIONAL: Mildly disheveled, no acute distress HEAD: Forehead laceration noted, no signs of trauma.  See photo below EYES: EOMI/PERRL, no nystagmus ENMT: Mucous membranes moist NECK: supple no meningeal signs No cervical/thoracic/lumbar tenderness No bruising/crepitance/stepoffs noted to spine CV: S1/S2 noted, no murmurs/rubs/gallops noted LUNGS: Lungs are clear to auscultation bilaterally, no apparent  distress ABDOMEN: soft, nontender, no rebound or guarding GU:no cva tenderness NEURO:Awake/alert, face symmetric, no arm or leg drift is noted Equal 5/5 strength with shoulder abduction Equal 5/5 strength with hip flexion,knee flex/extension, foot dorsi/plantar flexion Cranial nerves 3/4/5/6/03/08/09/11/12 tested and intact Patient is able to ambulate but appears unsteady at times.  No significant ataxia is noted EXTREMITIES: pulses normal, full ROM, all extremities/joints palpated/ranged and nontender SKIN: warm, color normal, abrasion noted to left knee. PSYCH: no abnormalities of mood noted    ED Results / Procedures / Treatments   Labs (all labs ordered are listed, but only abnormal results are displayed) Labs Reviewed  CBC WITH DIFFERENTIAL/PLATELET - Abnormal; Notable for the following components:      Result Value   Hemoglobin 12.2 (*)    HCT 36.8 (*)    All other components within normal limits  URINALYSIS, ROUTINE W REFLEX MICROSCOPIC - Abnormal; Notable for the following components:   Glucose, UA 50 (*)    All other components within normal limits  COMPREHENSIVE METABOLIC PANEL  ETHANOL  RAPID URINE DRUG SCREEN, HOSP PERFORMED    EKG EKG Interpretation  Date/Time:  Thursday March 13 2021 00:54:32 EDT Ventricular Rate:  83 PR Interval:  140 QRS Duration: 72 QT Interval:  366 QTC Calculation: 430 R Axis:   60 Text Interpretation: Normal sinus rhythm Normal ECG Interpretation limited secondary to artifact Confirmed by Ripley Fraise [phone]) on 03/13/2021 4:37:27 AM  Radiology DG Shoulder Right  Result Date: 03/13/2021 CLINICAL DATA:  Recent falls with right shoulder pain, initial encounter EXAM: RIGHT SHOULDER - 2+ VIEW COMPARISON:  None. FINDINGS: Mild degenerative changes of the acromioclavicular joint are noted. No acute fracture or dislocation is noted. The underlying bony thorax is within normal limits. Soft tissue structures are within normal limits.  IMPRESSION: Mild degenerative change without acute abnormality. Electronically Signed   By: Inez Catalina M.D.   On: 03/13/2021 01:28   CT Head Wo Contrast  Result Date: 03/13/2021 CLINICAL DATA:  Multiple falls, head injury EXAM: CT HEAD WITHOUT CONTRAST CT CERVICAL SPINE WITHOUT CONTRAST TECHNIQUE: Multidetector CT imaging of the head and cervical spine was performed following the standard protocol without intravenous contrast. Multiplanar CT image reconstructions of the cervical spine were also generated. COMPARISON:  MRI 04/23/2019, CT 04/23/2019, cervical spine radiograph 07/07/2005 FINDINGS: CT HEAD FINDINGS Brain: Remote bilateral lacunar infarcts in the basal ganglia and corona radiata as well as in the left pons with additional background of diffuse patchy and confluent regions of white matter hypoattenuation suggesting extensive underlying microvascular angiopathy. Evidence of wallerian degeneration on the left as well corresponding well with findings on prior MR imaging. Benign likely senescent mineralization of the basal ganglia as well. No evidence of acute infarction, hemorrhage, hydrocephalus, extra-axial collection, visible mass lesion or mass effect. Symmetric prominence of the ventricles, cisterns and sulci compatible with parenchymal volume loss and ex vacuo dilatation. Vascular: Atherosclerotic calcification of the  carotid siphons. No hyperdense vessel. Skull: Right frontal scalp swelling and laceration with soft tissue gas and thin crescentic scalp hematoma measuring up to 5 mm in maximal thickness. No calvarial fracture or visible facial bone fracture within the margins of imaging. 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 Sinuses/Orbits: Paranasal sinuses and mastoid air cells are predominantly clear. Asymmetric right palpebral thickening and periorbital swelling extending from the scalp laceration. No retro septal gas, stranding or  hemorrhage. Orbits are otherwise unremarkable. Other: None. CT CERVICAL SPINE FINDINGS Alignment: Straightening and slight reversal the normal cervical lordosis. No evidence of traumatic listhesis. Bony fusion of the left C2-3 articular facet. No abnormally widened, perched or jumped facets. Normal alignment of the craniocervical and atlantoaxial articulations. Skull base and vertebrae: No acute skull base fracture. No vertebral body fracture or height loss. Lucent lesion with some coarse trabeculation in the T2 vertebral body extending into the left pedicle has an appearance most compatible with a large vertebral body hemangioma. No other conspicuous osseous lesion. Multilevel Schmorl's node formations. Cervical spondylitic changes as detailed below. Additional arthrosis at the atlantodental interval with some mild calcific pannus formation. Soft tissues and spinal canal: No pre or paravertebral fluid or swelling. No visible canal hematoma. Airways patent. Cervical carotid atherosclerosis. No suspicious adenopathy. Disc levels: Multilevel intervertebral disc height loss with spondylitic endplate changes. Multilevel posterior disc osteophyte complexes C3-4, C4-5, C6-7, C7-T1 resulting in mild canal stenosis, partial effacement of the ventral thecal sac at the remaining levels. Uncinate spurring and facet hypertrophic changes are noted throughout the cervical levels resulting in mild-to-moderate multilevel neural foraminal narrowing as well. Upper chest: Apical centrilobular emphysema. No acute abnormality in the upper chest or imaged lung apices. Other: Normal thyroid. IMPRESSION: 1. Right frontal scalp laceration and crescentic scalp hematoma. No subjacent calvarial fracture. 2. No acute intracranial abnormality. No acute cervical spine fracture or traumatic listhesis. 3. Straightening and reversal the cervical spine may be related to positioning or muscle spasm. 4. Chronic lacunar type infarcts and extensive  microvascular angiopathy and features of prior left wallerian degeneration. 5. Multilevel cervical spondylitic changes, as detailed above. 6. Lucency and coarsened trabecular bone at the T2 vertebral body, appearance most consistent with a large vertebral body hemangioma. 7. Arthrosis at the atlantodental interval with some calcific pannus formation posterior to the dens, nonspecific though can be seen as early manifestation of CPPD or rheumatoid arthropathy. Electronically Signed   By: Lovena Le M.D.   On: 03/13/2021 03:47   CT Cervical Spine Wo Contrast  Result Date: 03/13/2021 CLINICAL DATA:  Multiple falls, head injury EXAM: CT HEAD WITHOUT CONTRAST CT CERVICAL SPINE WITHOUT CONTRAST TECHNIQUE: Multidetector CT imaging of the head and cervical spine was performed following the standard protocol without intravenous contrast. Multiplanar CT image reconstructions of the cervical spine were also generated. COMPARISON:  MRI 04/23/2019, CT 04/23/2019, cervical spine radiograph 07/07/2005 FINDINGS: CT HEAD FINDINGS Brain: Remote bilateral lacunar infarcts in the basal ganglia and corona radiata as well as in the left pons with additional background of diffuse patchy and confluent regions of white matter hypoattenuation suggesting extensive underlying microvascular angiopathy. Evidence of wallerian degeneration on the left as well corresponding well with findings on prior MR imaging. Benign likely senescent mineralization of the basal ganglia as well. No evidence of acute infarction, hemorrhage, hydrocephalus, extra-axial collection, visible mass lesion or mass effect. Symmetric prominence of the ventricles, cisterns and sulci compatible with parenchymal volume loss and ex vacuo dilatation. Vascular:  Atherosclerotic calcification of the carotid siphons. No hyperdense vessel. Skull: Right frontal scalp swelling and laceration with soft tissue gas and thin crescentic scalp hematoma measuring up to 5 mm in maximal  thickness. No calvarial fracture or visible facial bone fracture within the margins of imaging. 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 Sinuses/Orbits: Paranasal sinuses and mastoid air cells are predominantly clear. Asymmetric right palpebral thickening and periorbital swelling extending from the scalp laceration. No retro septal gas, stranding or hemorrhage. Orbits are otherwise unremarkable. Other: None. CT CERVICAL SPINE FINDINGS Alignment: Straightening and slight reversal the normal cervical lordosis. No evidence of traumatic listhesis. Bony fusion of the left C2-3 articular facet. No abnormally widened, perched or jumped facets. Normal alignment of the craniocervical and atlantoaxial articulations. Skull base and vertebrae: No acute skull base fracture. No vertebral body fracture or height loss. Lucent lesion with some coarse trabeculation in the T2 vertebral body extending into the left pedicle has an appearance most compatible with a large vertebral body hemangioma. No other conspicuous osseous lesion. Multilevel Schmorl's node formations. Cervical spondylitic changes as detailed below. Additional arthrosis at the atlantodental interval with some mild calcific pannus formation. Soft tissues and spinal canal: No pre or paravertebral fluid or swelling. No visible canal hematoma. Airways patent. Cervical carotid atherosclerosis. No suspicious adenopathy. Disc levels: Multilevel intervertebral disc height loss with spondylitic endplate changes. Multilevel posterior disc osteophyte complexes C3-4, C4-5, C6-7, C7-T1 resulting in mild canal stenosis, partial effacement of the ventral thecal sac at the remaining levels. Uncinate spurring and facet hypertrophic changes are noted throughout the cervical levels resulting in mild-to-moderate multilevel neural foraminal narrowing as well. Upper chest: Apical centrilobular emphysema. No acute abnormality in the upper  chest or imaged lung apices. Other: Normal thyroid. IMPRESSION: 1. Right frontal scalp laceration and crescentic scalp hematoma. No subjacent calvarial fracture. 2. No acute intracranial abnormality. No acute cervical spine fracture or traumatic listhesis. 3. Straightening and reversal the cervical spine may be related to positioning or muscle spasm. 4. Chronic lacunar type infarcts and extensive microvascular angiopathy and features of prior left wallerian degeneration. 5. Multilevel cervical spondylitic changes, as detailed above. 6. Lucency and coarsened trabecular bone at the T2 vertebral body, appearance most consistent with a large vertebral body hemangioma. 7. Arthrosis at the atlantodental interval with some calcific pannus formation posterior to the dens, nonspecific though can be seen as early manifestation of CPPD or rheumatoid arthropathy. Electronically Signed   By: Lovena Le M.D.   On: 03/13/2021 03:47   DG Knee Complete 4 Views Left  Result Date: 03/13/2021 CLINICAL DATA:  History of recent falls with left knee pain, initial encounter EXAM: LEFT KNEE - COMPLETE 4+ VIEW COMPARISON:  None. FINDINGS: Discal calcifications are noted. Calcific changes are noted along the superior aspect of the patella both medially and laterally likely related to loose bodies. No significant joint effusion is noted. No acute fracture or dislocation is noted. No other focal abnormality is seen. IMPRESSION: Calcifications near the superior aspect of the patella likely related to loose bodies. No joint effusion or acute bony abnormality is noted. Electronically Signed   By: Inez Catalina M.D.   On: 03/13/2021 01:31   DG Knee Complete 4 Views Right  Result Date: 03/13/2021 CLINICAL DATA:  History of multiple falls with right-sided knee pain, initial encounter EXAM: RIGHT KNEE - COMPLETE 4+ VIEW COMPARISON:  08/18/2013 FINDINGS: Meniscal calcifications are noted. No acute fracture or dislocation is seen. No joint  effusion is noted. Bony density adjacent to the inferior tip of the patella is again noted and stable. IMPRESSION: Chronic changes without acute abnormality. Electronically Signed   By: Inez Catalina M.D.   On: 03/13/2021 01:30    Procedures .Marland KitchenLaceration Repair  Date/Time: 03/13/2021 5:16 AM Performed by: Ripley Fraise, MD Authorized by: Ripley Fraise, MD   Consent:    Consent obtained:  Verbal   Consent given by:  Patient   Risks discussed:  Pain   Alternatives discussed:  No treatment Universal protocol:    Patient identity confirmed:  Verbally with patient and arm band Anesthesia:    Anesthesia method:  Local infiltration   Local anesthetic:  Lidocaine 2% WITH epi Laceration details:    Location:  Scalp   Scalp location:  Frontal   Length (cm):  4 Pre-procedure details:    Preparation:  Patient was prepped and draped in usual sterile fashion Exploration:    Wound exploration: entire depth of wound visualized     Contaminated: no   Treatment:    Amount of cleaning:  Extensive   Irrigation solution:  Sterile saline   Irrigation method:  Pressure wash Skin repair:    Repair method:  Sutures and Steri-Strips   Suture size:  5-0   Wound skin closure material used: vicryl.   Suture technique:  Simple interrupted   Number of sutures:  3   Number of Steri-Strips:  5 Approximation:    Approximation:  Close Post-procedure details:    Procedure completion:  Tolerated well, no immediate complications   Medications Ordered in ED Medications  Tdap (BOOSTRIX) injection 0.5 mL (0.5 mLs Intramuscular Given 03/13/21 0331)  lidocaine-EPINEPHrine (XYLOCAINE W/EPI) 2 %-1:200000 (PF) injection (  Given by Other 03/13/21 0459)    ED Course  I have reviewed the triage vital signs and the nursing notes.  Pertinent labs & imaging results that were available during my care of the patient were reviewed by me and considered in my medical decision making (see chart for details).    MDM  Rules/Calculators/A&P                          Patient presents after a fall.  Patient is an extremely poor historian.  However after further discussion, he reports over the past 2 weeks he has had almost 4 falls.  He reports at times he feels that his legs are giving out or he loses his balance.  He denies syncope.  He denies any focal arm or leg weakness at.  He has no new neck or back pain. Overall his labs are reassuring.  There are multiple incidental findings on CT head and C-spine, but no acute findings.  I reviewed the CT images with radiologist Dr. Nevada Crane who states there are no acute findings.  However patient does have previous history of stroke.  Due to this increasing gait difficulties, will proceed with MRI brain to rule out stroke.  While in his bed, he has no focal weakness in his arms or legs to suggest acute radiculopathy/myelopathy.  He has good equal strength in both upper extremities, lower suspicion for central cord syndrome 7:23 AM Signed out to Dr Gilford Raid at shift change  Final Clinical Impression(s) / ED Diagnoses Final diagnoses:  Laceration of forehead, initial encounter  Concussion without loss of consciousness, initial encounter  Strain of neck muscle, initial encounter  Abrasion of left knee, initial encounter    Rx / DC Orders  ED Discharge Orders     None        Ripley Fraise, MD 03/13/21 [phone]

## 2021-03-13 NOTE — ED Provider Notes (Signed)
Pt signed out by Dr. Christy Gentles pending MRI results.  IMPRESSION: 1. Scattered small areas of acute infarct in the left frontal deep white matter and left frontal parietal periventricular white matter. 2. Extensive chronic small vessel ischemic changes. 3. No intracranial hemorrhage. Moderately large right frontal scalp contusion with laceration.  Pt d/w Dr. Curly Shores (neurology) who will see pt in consult.  Admit to medicine.  Pt d/w FP who will admit.  CRITICAL CARE Performed by: Isla Pence   Total critical care time: 30 minutes  Critical care time was exclusive of separately billable procedures and treating other patients.  Critical care was necessary to treat or prevent imminent or life-threatening deterioration.  Critical care was time spent personally by me on the following activities: development of treatment plan with patient and/or surrogate as well as nursing, discussions with consultants, evaluation of patient's response to treatment, examination of patient, obtaining history from patient or surrogate, ordering and performing treatments and interventions, ordering and review of laboratory studies, ordering and review of radiographic studies, pulse oximetry and re-evaluation of patient's condition.           Isla Pence, MD 03/13/21 1035

## 2021-03-13 NOTE — ED Notes (Signed)
Patient transported to MRI 

## 2021-03-13 NOTE — Progress Notes (Signed)
Echocardiogram 2D Echocardiogram has been performed.  Oneal Deputy Nyah Shepherd RDCS 03/13/2021, 3:37 PM

## 2021-03-13 NOTE — ED Provider Notes (Signed)
Emergency Medicine Provider Triage Evaluation Note  David [name] , a 65 y.o. male  was evaluated in triage.  Pt complains of fall.  States this has been happening a lot lately and not sure why.  Denies feeling dizzy or weak today. [place] today and injured head, right shoulder, left knee.  Last tetanus many years ago.  Not on anticoagulation.  Review of Systems  Positive: Falls, joint pain Negative: Chest pain, dizziness,  Physical Exam  BP (!) 172/103 (BP Location: Left Arm)   Pulse 85   Temp 98.3 F (36.8 C) (Oral)   Resp (!) 24   SpO2 97%  Gen:   Awake, no distress   Resp:  Normal effort  MSK:   Moves extremities without difficulty  Other:  Wound noted to mid forehead and left knee  Medical Decision Making  Medically screening exam initiated at 12:45 AM.  Appropriate orders placed.  David [name] was informed that the remainder of the evaluation will be completed by another provider, this initial triage assessment does not replace that evaluation, and the importance of remaining in the ED until their evaluation is complete.  Frequent falls recently without reported dizziness/weakness.  Will obtain labs, imaging, EKG.  Tetanus ordered.   Larene Pickett, PA-C 03/13/21 2023    Ripley Fraise, MD 03/13/21 [phone]

## 2021-03-13 NOTE — ED Triage Notes (Signed)
Pt reports multiple falls recently, unsure why he fell. Denies LOC. Pt has hematoma and lac noted to rt forehead, also abrasion to knees and pain to rt shoulder and rt knee with difficulty moving it. Bleeding controlled. Pt not on blood thinners. Unknown last tetanus.

## 2021-03-13 NOTE — ED Notes (Addendum)
Pt ambulated to bathroom with RN. Noticed a few stumbles, but otherwise gait was stable.

## 2021-03-13 NOTE — ED Notes (Signed)
Patient given meal tray.

## 2021-03-13 NOTE — ED Notes (Signed)
Pt ambulated to RR and back pt did well

## 2021-03-13 NOTE — ED Notes (Signed)
Pt transported to Xray. 

## 2021-03-13 NOTE — Consult Note (Signed)
Neurology Consultation  Reason for Consult: MRI with strokes.  Referring Physician: Aileen Pilot., MD ED.   CC: falls, HA, stroke on MRI brain.  History is obtained from: chart, patient.   HPI: David [name] is a 65 y.o. right handed male with a PMHx of Cocaine abuse, left basal ganglia infarct in 2018, HTN, HLD, and "pre" DM II. In 07/2017 patient was hospitalized for stroke with c/o disorientation, dysarthria, unsteady gait, RUE weakness, and facial droop. Sequelae from old stroke was dysarthria and he had speech therapy. He failed to f/up with neurology out patient.   He tells NP that his right arm is weaker than the left. States this is chronic since last stroke. He is able to move it, strength is normal, but he eats with his left hand. No history of AFib.   Today, patient presented to ED after a fall with injuries. He states he was walking to the bus stop and just tripped over his feet. No flashing lights or other visual disturbances. No LOC. No new weakness but c/o speech abnormality and RUE weakness (chronic). No history of seizure. His right forehead laceration has been sutured. C Spine CT was negative for fracture. MRI brain was positive for scattered small areas of acute infarcts with chronic small vessel ischemia.   Patient states that he last smoked crack cocaine 2 months ago. His UDS is negative for illicit substances. ED workup has revealed a negative UA, normal CMP, ETOH < 10, and WBCC 5.2. Patient states he adheres to his ASA therapy, but does not take his Lipitor.   Labs from 2018 showed a Hgb A1c of 5.9% and LDL of 114. Per neurology note in 07/2017, stated his stroke was late acute to early subacute infarction in the left putamen, corona radiata, and caudate. Etiology at that time was thought to be vasospasm secondary to cocaine abuse. He was discharged on ASA and statin.   Because of strokes found on MRI brain, neurology was asked to consult.   ROS: A robust ROS was performed  and is negative except as noted in the HPI.   Past Medical History:  Diagnosis Date   AKI (acute kidney injury) ([place]) 09/01/2017   Benign prostatic hyperplasia    Cerebral infarction ([place]) 08/04/2017  Stroke 2018, left basal ganglia. Cocaine abuse.   No family history on file.  Social History:  No history of tobacco use. + hx of Cocaine abuse.   Medications Current Facility-Administered Medications:    aspirin tablet 325 mg, 325 mg, Oral, Daily, Isla Pence, MD  Current Outpatient Medications:    aspirin 325 MG EC tablet, TAKE 1 TABLET BY MOUTH EVERY DAY (Patient not taking: Reported on 03/14/2020), Disp: 30 tablet, Rfl: 3   atorvastatin (LIPITOR) 80 MG tablet, TAKE 1 TABLET (80 MG TOTAL) BY MOUTH DAILY AT 6 PM., Disp: 30 tablet, Rfl: 3   Diclofenac Sodium 1 % CREA, Apply to right shoulder area four time sper day, Disp: 120 g, Rfl: 2   tamsulosin (FLOMAX) 0.4 MG CAPS capsule, TAKE 1 CAPSULE BY MOUTH EVERY DAY (Patient taking differently: Take 0.4 mg by mouth daily. ), Disp: 90 capsule, Rfl: 1  Exam: Current vital signs: BP (!) 157/106   Pulse 74   Temp 98.3 F (36.8 C) (Oral)   Resp 16   Ht 5\' 10"  (1.778 m)   SpO2 100%   BMI 21.52 kg/m  Vital signs in last 24 hours: Temp:  [98.3 F (36.8 C)] 98.3 F (36.8 C) (  07/14 0040) Pulse Rate:  [60-87] 74 (07/14 0851) Resp:  [16-24] 16 (07/14 0851) BP: (105-175)/(69-106) 157/106 (07/14 0851) SpO2:  [97 %-100 %] 100 % (07/14 0851)  PE: GENERAL: Fairly well appearing male. Awake, alert in NAD.  Thin HEENT: normocephalic and atraumatic. LUNGS - Normal respiratory effort.  CV - RRR. ABDOMEN - Soft, nontender. Ext: warm, well perfused. Psych: affect very flat. Avoids eye contact.   NEURO:  Mental Status: Alert and oriented x3.  Speech/Language: speech is without dysarthria or aphasia.  Naming, repetition, fluency, and comprehension intact.  Cranial Nerves:  II: PERRL   22mm/brisk. visual fields full.  III, IV, VI: EOMI. Lid  elevation symmetric and full.  V: sensation is intact and symmetrical to face.  VII: Smile is symmetrical.   VIII:hearing intact to voice. IX, X: Palate elevation is symmetric. Phonation normal.  XI: Normal sternocleidomastoid and trapezius muscle strength. HKV:QQVZDG is symmetrical without fasciculations.   Motor:  RUE: grips  4+/5   triceps 4+/5   biceps  5/5     deltoid 4+/5 LUE: grips  5/5      triceps 5 /5      biceps   5/5 deltoid 5/5 RLE:  knee  5/5    thigh  5/5   plantar flexion  5/5     dorsiflexion   5/5 LLE:  knee  5/5   thigh  5/5     plantar flexion   5/5     dorsiflexion   5/5 Tone is normal. Bulk is normal.  There is no pronator drift, however in casual observation the right upper extremity is in a slight flexion at the wrist typical of poststroke weakness Sensation- Intact to light touch bilaterally in all four extremities. Extinction absent to light touch to DSS.  Coordination: FTN intact bilaterally. HKS intact bilaterally. No pronator drift.  DTRs:  2+ throughout.  Gait- deferred.  NIHSS:  1a Level of Consciousness: 0 1b LOC Questions: 0 1c LOC Commands: 0 2 Best Gaze: 0 3 Visual: 0 4 Facial Palsy: 0 5a Motor Arm - left: 0 5b Motor Arm - Right: 0 6a Motor Leg - Left: 0 6b Motor Leg - Right: 0 7 Limb Ataxia: 0 8 Sensory: 0 9 Best Language: 0 10 Dysarthria: 0 11 Extinction and Inattention: 0 TOTAL: 0  Labs I have reviewed labs in epic and the results pertinent to this consultation are:  CBC    Component Value Date/Time   WBC 5.2 03/13/2021 0125   RBC 4.29 03/13/2021 0125   HGB 12.2 (L) 03/13/2021 0125   HCT 36.8 (L) 03/13/2021 0125   PLT 234 03/13/2021 0125   MCV 85.8 03/13/2021 0125   MCH 28.4 03/13/2021 0125   MCHC 33.2 03/13/2021 0125   RDW 15.2 03/13/2021 0125   LYMPHSABS 1.2 03/13/2021 0125   MONOABS 0.6 03/13/2021 0125   EOSABS 0.1 03/13/2021 0125   BASOSABS 0.0 03/13/2021 0125    CMP     Component Value Date/Time   NA 139  03/13/2021 0125   NA 142 04/29/2018 0956   K 4.1 03/13/2021 0125   CL 107 03/13/2021 0125   CO2 25 03/13/2021 0125   GLUCOSE 94 03/13/2021 0125   BUN 21 03/13/2021 0125   BUN 25 04/29/2018 0956   CREATININE 1.07 03/13/2021 0125   CREATININE 1.22 11/06/2016 1003   CALCIUM 9.0 03/13/2021 0125   PROT 7.8 03/13/2021 0125   ALBUMIN 3.7 03/13/2021 0125   AST 28 03/13/2021 0125   ALT  16 03/13/2021 0125   ALKPHOS 70 03/13/2021 0125   BILITOT 0.3 03/13/2021 0125   GFRNONAA >60 03/13/2021 0125   GFRNONAA 71 12/05/2013 1026   GFRAA >60 03/12/2020 1545   GFRAA 82 12/05/2013 1026    Lab Results  Component Value Date   HGBA1C 6.1 (A) 10/26/2018    Lab Results  Component Value Date   CHOL 194 04/29/2018   HDL 59 04/29/2018   LDLCALC 106 (H) 04/29/2018   TRIG 143 04/29/2018   CHOLHDL 3.3 04/29/2018     Imaging MD reviewed the images obtained.  CT head -Right frontal scalp laceration and crescentic scalp hematoma. No subjacent calvarial fracture. -No acute intracranial abnormality. No acute cervical spine fracture or traumatic listhesis. -Straightening and reversal the cervical spine may be related to positioning or muscle spasm. -Chronic lacunar type infarcts and extensive microvascular angiopathy and features of prior left wallerian degeneration. -Multilevel cervical spondylitic changes, as detailed above. -Lucency and coarsened trabecular bone at the T2 vertebral body, appearance most consistent with a large vertebral body hemangioma. -Arthrosis at the atlantodental interval with some calcific pannus formation posterior to the dens, nonspecific though can be seen as early manifestation of CPPD or rheumatoid arthropathy.   MRI brain -Scattered small areas of acute infarct in the left frontal deep white matter and left frontal parietal periventricular white matter. -Extensive chronic small vessel ischemic changes. -No intracranial hemorrhage. Moderately large right  frontal scalp contusion with laceration.  Assessment: 65 yo male with a history of stroke in 2018 presented after a fall with right arm weakness. MRI brain with scattered small areas of acute infarct. Even though he has no known history of AFib, these scattered infarcts could represent a thrombus that was broken up. A CTA head and neck ordered to further assess extra/intracranial vessels. No history of cardiac arrhythmia, but would investigate further with 30 day monitoring to help define is stroke is ischemic vs. embolic. However, Cocaine abuse with associated vasospasm/vasculopathy may be contributing to his strokes. Patient admits to non adherence with Atorvastatin.  Interestingly his stroke is in the same vascular territory as the event in 2018; will assess carefully for carotid web on CTA  Impression: -Scattered small left MCA territory stroke.  -history of Cocaine abuse.  -old stroke 2018 thought to be secondary to vasospasm associated with cocaine abuse.  -HLD with non adherence with statin.   Recommendations/Plan:  - Admit by medicine.  - HgbA1c, fasting lipid panel, TSH.  - If LDL over 70, need to stress importance of adherence to statin which is already at 80mg  - CTA head and neck.  - Frequent neuro checks. - Echocardiogram. - Decrease ASA to 81mg  po qd.  - Plavix 300 mg loading dose, 75mg  po qd x 21 days then ASA alone.  - Risk factor modification. - cardiac telemetry monitoring for arrhythmia. - PT consult, OT consult, Speech consult. - stroke education. - Recommend 30-day event monitor at discharge to assess for arrhythmia which may reveal embolic source for stroke.  - SW for drug abuse counseling. - Stroke team to follow.  Pt seen by Clance Boll, NP/Neuro and later by MD. Note/plan to be edited by MD as needed.  Pager: [phone]  Attending Neurologist's note:  I personally saw this patient, gathering history, performing a full neurologic examination, reviewing  relevant labs, personally reviewing relevant imaging including MRI brain, and formulated the assessment and plan, adding the note above for completeness and clarity to accurately reflect my thoughts  Eitan Doubleday  MD-PhD [facility] [phone]  Available 7 AM to 7 PM, outside these hours please contact Neurologist on call listed on AMION

## 2021-03-13 NOTE — ED Notes (Signed)
Walked patient to the bathroom patient did well patient is now back in the bed with side rails up

## 2021-03-13 NOTE — Hospital Course (Addendum)
David [name] is a 65 y.o. male presenting with fall 2/2 acute frontal CVA . PMH is significant for previous CVA in 2018, HTN, HLD, cocaine abuse, BPH.  Acute CVA NIHSS 0. CT with chronic lacunar type infarcts and extensive microvascular angiopathy, MRI with scattered areas of acute infarct in left frontal deep white matter. No intracranial hemmorhage noted on MRI. Neurology consulted in ED, started on DAPT. Risk stratification labs ***. Loop recorder arranged prior to discharge***. Evaluated by PT/OT/SLP and ***.   HLD Repeat lipid panel demonstrated total cholesterol 201, triglycerides 98, LDL 133. Started on high-intensity statin, Atorvastatin 40mg  ***.   HTN Not on home anti-hypertensives. Allowed for permissive HTN during admission.   Prediabetes Hgb A1c 6.4. Couseled ***  Cocaine Abuse  Homelessness SW consulted for financial concerns and for substance abuse resources.   Discharge recommendations: A1c 6.4 on admission. PCP to monitor. Recommend lifestyle changes.  Started on atorvastatin 40 mg during admission. PCP to monitor for tolerance. Recommend lipid panel recheck in 4-6 weeks. ASA 81 mg, Plavix 75 mg daily x21 days then aspirin alone

## 2021-03-14 ENCOUNTER — Other Ambulatory Visit (HOSPITAL_COMMUNITY): Payer: Self-pay

## 2021-03-14 DIAGNOSIS — I639 Cerebral infarction, unspecified: Secondary | ICD-10-CM | POA: Diagnosis not present

## 2021-03-14 DIAGNOSIS — I63312 Cerebral infarction due to thrombosis of left middle cerebral artery: Secondary | ICD-10-CM | POA: Diagnosis not present

## 2021-03-14 DIAGNOSIS — S0181XA Laceration without foreign body of other part of head, initial encounter: Secondary | ICD-10-CM | POA: Diagnosis not present

## 2021-03-14 LAB — LIPID PANEL
Cholesterol: 201 mg/dL — ABNORMAL HIGH (ref 0–200)
HDL: 48 mg/dL (ref >40)
LDL Cholesterol: 133 mg/dL — ABNORMAL HIGH (ref 0–99)
Total CHOL/HDL Ratio: 4.2 RATIO
Triglycerides: 98 mg/dL (ref <150)
VLDL: 20 mg/dL (ref 0–40)

## 2021-03-14 LAB — HEMOGLOBIN A1C
Hgb A1c MFr Bld: 6.4 % — ABNORMAL HIGH (ref 4.8–5.6)
Mean Plasma Glucose: 136.98 mg/dL

## 2021-03-14 MED ORDER — ATORVASTATIN CALCIUM 80 MG PO TABS
80.0000 mg | ORAL_TABLET | Freq: Every day | ORAL | 1 refills | Status: DC
  Filled 2021-03-14: qty 30, 30d supply, fill #0

## 2021-03-14 MED ORDER — CLOPIDOGREL BISULFATE 75 MG PO TABS
75.0000 mg | ORAL_TABLET | Freq: Every day | ORAL | 0 refills | Status: DC
  Filled 2021-03-14: qty 21, 21d supply, fill #0

## 2021-03-14 MED ORDER — CLOPIDOGREL BISULFATE 75 MG PO TABS
75.0000 mg | ORAL_TABLET | Freq: Every day | ORAL | Status: DC
  Administered 2021-03-14 – 2021-03-15 (×2): 75 mg via ORAL
  Filled 2021-03-14 (×2): qty 1

## 2021-03-14 MED ORDER — DICLOFENAC SODIUM 1 % EX GEL
CUTANEOUS | 1 refills | Status: DC
  Filled 2021-03-14: qty 100, 8d supply, fill #0

## 2021-03-14 MED ORDER — TAMSULOSIN HCL 0.4 MG PO CAPS
0.4000 mg | ORAL_CAPSULE | Freq: Every day | ORAL | 1 refills | Status: DC
  Filled 2021-03-14: qty 30, 30d supply, fill #0

## 2021-03-14 MED ORDER — ACETAMINOPHEN 325 MG PO TABS: 650.0000 mg | ORAL_TABLET | ORAL | Status: DC | PRN

## 2021-03-14 MED ORDER — ASPIRIN 81 MG PO TBEC
81.0000 mg | DELAYED_RELEASE_TABLET | Freq: Every day | ORAL | 1 refills | Status: DC
  Filled 2021-03-14: qty 30, 30d supply, fill #0

## 2021-03-14 MED ORDER — SENNOSIDES-DOCUSATE SODIUM 8.6-50 MG PO TABS: 1.0000 | ORAL_TABLET | Freq: Every evening | ORAL | Status: DC | PRN

## 2021-03-14 MED ORDER — ATORVASTATIN CALCIUM 80 MG PO TABS
80.0000 mg | ORAL_TABLET | Freq: Every day | ORAL | Status: DC
  Administered 2021-03-15: 80 mg via ORAL
  Filled 2021-03-14: qty 1

## 2021-03-14 NOTE — Progress Notes (Signed)
STROKE TEAM PROGRESS NOTE   SUBJECTIVE (INTERVAL HISTORY) No family is at the bedside.  Pt lying in bed, flat affect, right forehead laceration on dressing, mild right ptosis due to laceration/swelling. Mild right facial droop and right UE pronator drift but pt stated that was chronic from his last stroke. He admitted that his last time cocaine was two months ago and he was not compliant with medication at home.    OBJECTIVE Temp:  [97.7 F (36.5 C)-98.5 F (36.9 C)] 98.4 F (36.9 C) (07/15 1210) Pulse Rate:  [78-103] 78 (07/15 1210) Cardiac Rhythm: Normal sinus rhythm (07/15 0721) Resp:  [16-26] 16 (07/15 1210) BP: (134-166)/(84-99) 136/84 (07/15 1210) SpO2:  [97 %-100 %] 100 % (07/15 1210)  No results for input(s): GLUCAP in the last 168 hours. Recent Labs  Lab 03/13/21 0125  NA 139  K 4.1  CL 107  CO2 25  GLUCOSE 94  BUN 21  CREATININE 1.07  CALCIUM 9.0   Recent Labs  Lab 03/13/21 0125  AST 28  ALT 16  ALKPHOS 70  BILITOT 0.3  PROT 7.8  ALBUMIN 3.7   Recent Labs  Lab 03/13/21 0125  WBC 5.2  NEUTROABS 3.3  HGB 12.2*  HCT 36.8*  MCV 85.8  PLT 234   No results for input(s): CKTOTAL, CKMB, CKMBINDEX, TROPONINI in the last 168 hours. No results for input(s): LABPROT, INR in the last 72 hours. Recent Labs    03/13/21 0125  COLORURINE YELLOW  LABSPEC 1.016  PHURINE 6.0  GLUCOSEU 50*  HGBUR NEGATIVE  BILIRUBINUR NEGATIVE  KETONESUR NEGATIVE  PROTEINUR NEGATIVE  NITRITE NEGATIVE  LEUKOCYTESUR NEGATIVE       Component Value Date/Time   CHOL 201 (H) 03/14/2021 0424   CHOL 194 04/29/2018 0956   TRIG 98 03/14/2021 0424   HDL 48 03/14/2021 0424   HDL 59 04/29/2018 0956   CHOLHDL 4.2 03/14/2021 0424   VLDL 20 03/14/2021 0424   LDLCALC 133 (H) 03/14/2021 0424   LDLCALC 106 (H) 04/29/2018 0956   Lab Results  Component Value Date   HGBA1C 6.4 (H) 03/14/2021      Component Value Date/Time   LABOPIA NONE DETECTED 03/13/2021 0125   COCAINSCRNUR NONE  DETECTED 03/13/2021 0125   COCAINSCRNUR POS (A) 08/02/2012 0921   LABBENZ NONE DETECTED 03/13/2021 0125   LABBENZ NEG 08/02/2012 0921   AMPHETMU NONE DETECTED 03/13/2021 0125   THCU NONE DETECTED 03/13/2021 0125   LABBARB NONE DETECTED 03/13/2021 0125    Recent Labs  Lab 03/13/21 0125  ETH <10    I have personally reviewed the radiological images below and agree with the radiology interpretations.  CT ANGIO HEAD W OR WO CONTRAST  Result Date: 03/13/2021 CLINICAL DATA:  Stroke, follow-up. EXAM: CT ANGIOGRAPHY HEAD AND NECK TECHNIQUE: Multidetector CT imaging of the head and neck was performed using the standard protocol during bolus administration of intravenous contrast. Multiplanar CT image reconstructions and MIPs were obtained to evaluate the vascular anatomy. Carotid stenosis measurements (when applicable) are obtained utilizing NASCET criteria, using the distal internal carotid diameter as the denominator. CONTRAST:  49mL OMNIPAQUE IOHEXOL 350 MG/ML SOLN COMPARISON:  MRI from same day.  MRA 08/04/2017. FINDINGS: CTA NECK FINDINGS Aortic arch: Great vessel origins are patent. Right carotid system: Mild-to-moderate calcific and noncalcific atherosclerosis at the carotid bifurcation without greater than 50% narrowing. Left carotid system: Mild-to-moderate calcific and noncalcific atherosclerosis at the carotid bifurcation without greater than 50% stenosis. Vertebral arteries: Codominant. No significant (greater than 50%)  stenosis. Skeleton: Please see same day CT of the cervical spine for evaluation. 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 Other neck: No acute abnormality. Upper chest: Emphysema. No consolidation in the visualized lung apices. Biapical pleuroparenchymal scarring. Review of the MIP images confirms the above findings CTA HEAD FINDINGS Anterior circulation: Mild calcific atherosclerosis of bilateral intracranial ICAs without greater than 50% stenosis. Bilateral MCAs are patent  without proximal flow limiting stenosis. Mild right M1 MCA narrowing. Bilateral ACAs are patent. There is moderate stenosis of the left A2 ACA (see series 8, images 117/118) with possible additional moderate stenosis of the left A2 ACA origin.; mild multifocal narrowing of the right A2 ACA. Posterior circulation: Bilateral intradural vertebral arteries are patent. Motion limited evaluation of basilar artery with suspected moderate stenosis. Small vertebrobasilar system with bilateral prominent posterior communicating arteries and small P1 PCAs, anatomic variant. Bilateral PCA are patent without proximal flow limiting stenosis. Evaluation of the distal PCAs is limited by venous contamination. Venous sinuses: As permitted by contrast timing, patent. Anatomic variants: As detailed above. Review of the MIP images confirms the above findings IMPRESSION: CTA Head: 1. No large vessel occlusion. 2. Moderate left A2 ACA stenosis, possibly multifocal. 3. Motion limited evaluation of basilar artery with suspected moderate stenosis. 4. Small vertebrobasilar system with bilateral prominent posterior communicating arteries and small P1 PCAs, anatomic variant. CTA Neck: 1. No significant (greater than 50%) stenosis. 2. Mild-to-moderate bilateral carotid bifurcation atherosclerosis. 3. Emphysema. Electronically Signed   By: Margaretha Sheffield MD   On: 03/13/2021 13:19   DG Shoulder Right  Result Date: 03/13/2021 CLINICAL DATA:  Recent falls with right shoulder pain, initial encounter EXAM: RIGHT SHOULDER - 2+ VIEW COMPARISON:  None. FINDINGS: Mild degenerative changes of the acromioclavicular joint are noted. No acute fracture or dislocation is noted. The underlying bony thorax is within normal limits. Soft tissue structures are within normal limits. IMPRESSION: Mild degenerative change without acute abnormality. Electronically Signed   By: Inez Catalina M.D.   On: 03/13/2021 01:28   CT Head Wo Contrast  Result Date:  03/13/2021 CLINICAL DATA:  Multiple falls, head injury EXAM: CT HEAD WITHOUT CONTRAST CT CERVICAL SPINE WITHOUT CONTRAST TECHNIQUE: Multidetector CT imaging of the head and cervical spine was performed following the standard protocol without intravenous contrast. Multiplanar CT image reconstructions of the cervical spine were also generated. COMPARISON:  MRI 04/23/2019, CT 04/23/2019, cervical spine radiograph 07/07/2005 FINDINGS: CT HEAD FINDINGS Brain: Remote bilateral lacunar infarcts in the basal ganglia and corona radiata as well as in the left pons with additional background of diffuse patchy and confluent regions of white matter hypoattenuation suggesting extensive underlying microvascular angiopathy. Evidence of wallerian degeneration on the left as well corresponding well with findings on prior MR imaging. Benign likely senescent mineralization of the basal ganglia as well. No evidence of acute infarction, hemorrhage, hydrocephalus, extra-axial collection, visible mass lesion or mass effect. Symmetric prominence of the ventricles, cisterns and sulci compatible with parenchymal volume loss and ex vacuo dilatation. Vascular: Atherosclerotic calcification of the carotid siphons. No hyperdense vessel. Skull: Right frontal scalp swelling and laceration with soft tissue gas and thin crescentic scalp hematoma measuring up to 5 mm in maximal thickness. No calvarial fracture or visible facial bone fracture within the margins of imaging. 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 Sinuses/Orbits: Paranasal sinuses and mastoid air cells are predominantly clear. Asymmetric right palpebral thickening and periorbital swelling extending from the scalp laceration. No retro septal gas, stranding  or hemorrhage. Orbits are otherwise unremarkable. Other: None. CT CERVICAL SPINE FINDINGS Alignment: Straightening and slight reversal the normal cervical lordosis. No evidence of  traumatic listhesis. Bony fusion of the left C2-3 articular facet. No abnormally widened, perched or jumped facets. Normal alignment of the craniocervical and atlantoaxial articulations. Skull base and vertebrae: No acute skull base fracture. No vertebral body fracture or height loss. Lucent lesion with some coarse trabeculation in the T2 vertebral body extending into the left pedicle has an appearance most compatible with a large vertebral body hemangioma. No other conspicuous osseous lesion. Multilevel Schmorl's node formations. Cervical spondylitic changes as detailed below. Additional arthrosis at the atlantodental interval with some mild calcific pannus formation. Soft tissues and spinal canal: No pre or paravertebral fluid or swelling. No visible canal hematoma. Airways patent. Cervical carotid atherosclerosis. No suspicious adenopathy. Disc levels: Multilevel intervertebral disc height loss with spondylitic endplate changes. Multilevel posterior disc osteophyte complexes C3-4, C4-5, C6-7, C7-T1 resulting in mild canal stenosis, partial effacement of the ventral thecal sac at the remaining levels. Uncinate spurring and facet hypertrophic changes are noted throughout the cervical levels resulting in mild-to-moderate multilevel neural foraminal narrowing as well. Upper chest: Apical centrilobular emphysema. No acute abnormality in the upper chest or imaged lung apices. Other: Normal thyroid. IMPRESSION: 1. Right frontal scalp laceration and crescentic scalp hematoma. No subjacent calvarial fracture. 2. No acute intracranial abnormality. No acute cervical spine fracture or traumatic listhesis. 3. Straightening and reversal the cervical spine may be related to positioning or muscle spasm. 4. Chronic lacunar type infarcts and extensive microvascular angiopathy and features of prior left wallerian degeneration. 5. Multilevel cervical spondylitic changes, as detailed above. 6. Lucency and coarsened trabecular bone at  the T2 vertebral body, appearance most consistent with a large vertebral body hemangioma. 7. Arthrosis at the atlantodental interval with some calcific pannus formation posterior to the dens, nonspecific though can be seen as early manifestation of CPPD or rheumatoid arthropathy. Electronically Signed   By: Lovena Le M.D.   On: 03/13/2021 03:47   CT Cervical Spine Wo Contrast  Result Date: 03/13/2021 CLINICAL DATA:  Multiple falls, head injury EXAM: CT HEAD WITHOUT CONTRAST CT CERVICAL SPINE WITHOUT CONTRAST TECHNIQUE: Multidetector CT imaging of the head and cervical spine was performed following the standard protocol without intravenous contrast. Multiplanar CT image reconstructions of the cervical spine were also generated. COMPARISON:  MRI 04/23/2019, CT 04/23/2019, cervical spine radiograph 07/07/2005 FINDINGS: CT HEAD FINDINGS Brain: Remote bilateral lacunar infarcts in the basal ganglia and corona radiata as well as in the left pons with additional background of diffuse patchy and confluent regions of white matter hypoattenuation suggesting extensive underlying microvascular angiopathy. Evidence of wallerian degeneration on the left as well corresponding well with findings on prior MR imaging. Benign likely senescent mineralization of the basal ganglia as well. No evidence of acute infarction, hemorrhage, hydrocephalus, extra-axial collection, visible mass lesion or mass effect. Symmetric prominence of the ventricles, cisterns and sulci compatible with parenchymal volume loss and ex vacuo dilatation. Vascular: Atherosclerotic calcification of the carotid siphons. No hyperdense vessel. Skull: Right frontal scalp swelling and laceration with soft tissue gas and thin crescentic scalp hematoma measuring up to 5 mm in maximal thickness. No calvarial fracture or visible facial bone fracture within the margins of imaging. Chronic left parietal fatty scalp lesion most consistent with a posterior convexity  lipoma. Sinuses/Orbits: Paranasal sinuses and mastoid air cells are predominantly clear. Asymmetric right palpebral thickening and periorbital swelling extending from the scalp laceration.  No retro septal gas, stranding or hemorrhage. Orbits are otherwise unremarkable. Other: None. CT CERVICAL SPINE FINDINGS Alignment: Straightening and slight reversal the normal cervical lordosis. No evidence of traumatic listhesis. Bony fusion of the left C2-3 articular facet. No abnormally widened, perched or jumped facets. Normal alignment of the craniocervical and atlantoaxial articulations. Skull base and vertebrae: No acute skull base fracture. No vertebral body fracture or height loss. Lucent lesion with some coarse trabeculation in the T2 vertebral body extending into the left pedicle has an appearance most compatible with a large vertebral body hemangioma. No other conspicuous osseous lesion. Multilevel Schmorl's node formations. Cervical spondylitic changes as detailed below. Additional arthrosis at the atlantodental interval with some mild calcific pannus formation. Soft tissues and spinal canal: No pre or paravertebral fluid or swelling. No visible canal hematoma. Airways patent. Cervical carotid atherosclerosis. No suspicious adenopathy. Disc levels: Multilevel intervertebral disc height loss with spondylitic endplate changes. Multilevel posterior disc osteophyte complexes C3-4, C4-5, C6-7, C7-T1 resulting in mild canal stenosis, partial effacement of the ventral thecal sac at the remaining levels. Uncinate spurring and facet hypertrophic changes are noted throughout the cervical levels resulting in mild-to-moderate multilevel neural foraminal narrowing as well. Upper chest: Apical centrilobular emphysema. No acute abnormality in the upper chest or imaged lung apices. Other: Normal thyroid. IMPRESSION: 1. Right frontal scalp laceration and crescentic scalp hematoma. No subjacent calvarial fracture. 2. No acute  intracranial abnormality. No acute cervical spine fracture or traumatic listhesis. 3. Straightening and reversal the cervical spine may be related to positioning or muscle spasm. 4. Chronic lacunar type infarcts and extensive microvascular angiopathy and features of prior left wallerian degeneration. 5. Multilevel cervical spondylitic changes, as detailed above. 6. Lucency and coarsened trabecular bone at the T2 vertebral body, appearance most consistent with a large vertebral body hemangioma. 7. Arthrosis at the atlantodental interval with some calcific pannus formation posterior to the dens, nonspecific though can be seen as early manifestation of CPPD or rheumatoid arthropathy. Electronically Signed   By: Lovena Le M.D.   On: 03/13/2021 03:47   MR BRAIN WO CONTRAST  Result Date: 03/13/2021 CLINICAL DATA:  Dizziness.  Multiple falls with head injury EXAM: MRI HEAD WITHOUT CONTRAST TECHNIQUE: Multiplanar, multiecho pulse sequences of the brain and surrounding structures were obtained without intravenous contrast. COMPARISON:  CT head 03/13/2021.  MRI head 04/23/2019 FINDINGS: Brain: Small areas of restricted diffusion compatible with acute infarct in the left frontal white matter, and also in the left frontal parietal periventricular white matter. Extensive chronic ischemic changes are present throughout the cerebral white matter extending into the basal ganglia and pons bilaterally. Mild atrophy and ventricular enlargement. No intracranial hemorrhage or fluid collection. No mass lesion. Vascular: Normal arterial flow voids. Skull and upper cervical spine: Negative Sinuses/Orbits: Mild mucosal edema paranasal sinuses without air-fluid level. Negative orbit Other: Moderately large right frontal scalp contusion with laceration. Left parietal scalp lipoma. IMPRESSION: 1. Scattered small areas of acute infarct in the left frontal deep white matter and left frontal parietal periventricular white matter. 2.  Extensive chronic small vessel ischemic changes. 3. No intracranial hemorrhage. Moderately large right frontal scalp contusion with laceration. Electronically Signed   By: Franchot Gallo M.D.   On: 03/13/2021 08:35   DG Knee Complete 4 Views Left  Result Date: 03/13/2021 CLINICAL DATA:  History of recent falls with left knee pain, initial encounter EXAM: LEFT KNEE - COMPLETE 4+ VIEW COMPARISON:  None. FINDINGS: Discal calcifications are noted. Calcific changes are noted along the  superior aspect of the patella both medially and laterally likely related to loose bodies. No significant joint effusion is noted. No acute fracture or dislocation is noted. No other focal abnormality is seen. IMPRESSION: Calcifications near the superior aspect of the patella likely related to loose bodies. No joint effusion or acute bony abnormality is noted. Electronically Signed   By: Inez Catalina M.D.   On: 03/13/2021 01:31   DG Knee Complete 4 Views Right  Result Date: 03/13/2021 CLINICAL DATA:  History of multiple falls with right-sided knee pain, initial encounter EXAM: RIGHT KNEE - COMPLETE 4+ VIEW COMPARISON:  08/18/2013 FINDINGS: Meniscal calcifications are noted. No acute fracture or dislocation is seen. No joint effusion is noted. Bony density adjacent to the inferior tip of the patella is again noted and stable. IMPRESSION: Chronic changes without acute abnormality. Electronically Signed   By: Inez Catalina M.D.   On: 03/13/2021 01:30   ECHOCARDIOGRAM COMPLETE  Result Date: 03/13/2021    ECHOCARDIOGRAM REPORT   Patient Name:   ANTHONEY SHEPPARD Date of Exam: 03/13/2021 Medical Rec #:  [id number]     Height:       70.0 in Accession #:    [id number]    Weight:       150.0 lb Date of Birth:  1955/09/13      BSA:          1.847 m Patient Age:    65 years      BP:           166/97 mmHg Patient Gender: M             HR:           78 bpm. Exam Location:  Inpatient Procedure: 2D Echo, Color Doppler and Cardiac Doppler Indications:     Stroke i63.9  History:        Patient has prior history of Echocardiogram examinations, most                 recent 08/04/2017. Risk Factors:Dyslipidemia and h/o cocaine                 abuse.  Sonographer:    Raquel Sarna Senior RDCS Referring Phys: [id number] MARGARET E PRAY  Sonographer Comments: Technically difficult due to thin body habitus. IMPRESSIONS  1. Left ventricular ejection fraction, by estimation, is 65 to 70%. The left ventricle has hyperdynamic function. The left ventricle has no regional wall motion abnormalities. Left ventricular diastolic parameters are consistent with Grade I diastolic dysfunction (impaired relaxation).  2. Right ventricular systolic function is normal. The right ventricular size is normal. Tricuspid regurgitation signal is inadequate for assessing PA pressure.  3. The mitral valve is normal in structure. No evidence of mitral valve regurgitation. No evidence of mitral stenosis.  4. The aortic valve is tricuspid. Aortic valve regurgitation is not visualized. No aortic stenosis is present.  5. The inferior vena cava is normal in size with <50% respiratory variability, suggesting right atrial pressure of 8 mmHg. FINDINGS  Left Ventricle: Left ventricular ejection fraction, by estimation, is 65 to 70%. The left ventricle has hyperdynamic function. The left ventricle has no regional wall motion abnormalities. The left ventricular internal cavity size was normal in size. There is no left ventricular hypertrophy. Left ventricular diastolic parameters are consistent with Grade I diastolic dysfunction (impaired relaxation). Right Ventricle: The right ventricular size is normal. No increase in right ventricular wall thickness. Right ventricular systolic function is normal. Tricuspid regurgitation signal is  inadequate for assessing PA pressure. Left Atrium: Left atrial size was normal in size. Right Atrium: Right atrial size was normal in size. Pericardium: There is no evidence of pericardial  effusion. Mitral Valve: The mitral valve is normal in structure. There is mild calcification of the mitral valve leaflet(s). Mild mitral annular calcification. No evidence of mitral valve regurgitation. No evidence of mitral valve stenosis. Tricuspid Valve: The tricuspid valve is normal in structure. Tricuspid valve regurgitation is not demonstrated. Aortic Valve: The aortic valve is tricuspid. Aortic valve regurgitation is not visualized. No aortic stenosis is present. Pulmonic Valve: The pulmonic valve was normal in structure. Pulmonic valve regurgitation is not visualized. Aorta: The aortic root is normal in size and structure. Venous: The inferior vena cava is normal in size with less than 50% respiratory variability, suggesting right atrial pressure of 8 mmHg. IAS/Shunts: No atrial level shunt detected by color flow Doppler.  LEFT VENTRICLE PLAX 2D LVIDd:         3.80 cm  Diastology LVIDs:         2.30 cm  LV e' medial:    6.64 cm/s LV PW:         0.80 cm  LV E/e' medial:  8.4 LV IVS:        0.70 cm  LV e' lateral:   8.92 cm/s LVOT diam:     2.10 cm  LV E/e' lateral: 6.3 LV SV:         56 LV SV Index:   30 LVOT Area:     3.46 cm  RIGHT VENTRICLE RV S prime:     12.00 cm/s TAPSE (M-mode): 1.8 cm LEFT ATRIUM             Index       RIGHT ATRIUM           Index LA diam:        2.60 cm 1.41 cm/m  RA Area:     10.60 cm LA Vol (A2C):   19.2 ml 10.39 ml/m RA Volume:   20.50 ml  11.10 ml/m LA Vol (A4C):   15.4 ml 8.34 ml/m LA Biplane Vol: 18.7 ml 10.12 ml/m  AORTIC VALVE LVOT Vmax:   96.60 cm/s LVOT Vmean:  60.900 cm/s LVOT VTI:    0.162 m  AORTA Ao Root diam: 3.30 cm MITRAL VALVE MV Area (PHT): 2.33 cm    SHUNTS MV Decel Time: 326 msec    Systemic VTI:  0.16 m MV E velocity: 56.10 cm/s  Systemic Diam: 2.10 cm MV A velocity: 80.60 cm/s MV E/A ratio:  0.70 Loralie Champagne MD Electronically signed by Loralie Champagne MD Signature Date/Time: 03/13/2021/5:33:09 PM    Final    CT ANGIO NECK CODE STROKE  Result Date:  03/13/2021 CLINICAL DATA:  Stroke, follow-up. EXAM: CT ANGIOGRAPHY HEAD AND NECK TECHNIQUE: Multidetector CT imaging of the head and neck was performed using the standard protocol during bolus administration of intravenous contrast. Multiplanar CT image reconstructions and MIPs were obtained to evaluate the vascular anatomy. Carotid stenosis measurements (when applicable) are obtained utilizing NASCET criteria, using the distal internal carotid diameter as the denominator. CONTRAST:  91mL OMNIPAQUE IOHEXOL 350 MG/ML SOLN COMPARISON:  MRI from same day.  MRA 08/04/2017. FINDINGS: CTA NECK FINDINGS Aortic arch: Great vessel origins are patent. Right carotid system: Mild-to-moderate calcific and noncalcific atherosclerosis at the carotid bifurcation without greater than 50% narrowing. Left carotid system: Mild-to-moderate calcific and noncalcific atherosclerosis at the carotid bifurcation without greater  than 50% stenosis. Vertebral arteries: Codominant. No significant (greater than 50%) stenosis. Skeleton: Please see same day CT of the cervical spine for evaluation. 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 Other neck: No acute abnormality. Upper chest: Emphysema. No consolidation in the visualized lung apices. Biapical pleuroparenchymal scarring. Review of the MIP images confirms the above findings CTA HEAD FINDINGS Anterior circulation: Mild calcific atherosclerosis of bilateral intracranial ICAs without greater than 50% stenosis. Bilateral MCAs are patent without proximal flow limiting stenosis. Mild right M1 MCA narrowing. Bilateral ACAs are patent. There is moderate stenosis of the left A2 ACA (see series 8, images 117/118) with possible additional moderate stenosis of the left A2 ACA origin.; mild multifocal narrowing of the right A2 ACA. Posterior circulation: Bilateral intradural vertebral arteries are patent. Motion limited evaluation of basilar artery with suspected moderate stenosis. Small vertebrobasilar  system with bilateral prominent posterior communicating arteries and small P1 PCAs, anatomic variant. Bilateral PCA are patent without proximal flow limiting stenosis. Evaluation of the distal PCAs is limited by venous contamination. Venous sinuses: As permitted by contrast timing, patent. Anatomic variants: As detailed above. Review of the MIP images confirms the above findings IMPRESSION: CTA Head: 1. No large vessel occlusion. 2. Moderate left A2 ACA stenosis, possibly multifocal. 3. Motion limited evaluation of basilar artery with suspected moderate stenosis. 4. Small vertebrobasilar system with bilateral prominent posterior communicating arteries and small P1 PCAs, anatomic variant. CTA Neck: 1. No significant (greater than 50%) stenosis. 2. Mild-to-moderate bilateral carotid bifurcation atherosclerosis. 3. Emphysema. Electronically Signed   By: Margaretha Sheffield MD   On: 03/13/2021 13:19     PHYSICAL EXAM  Temp:  [97.7 F (36.5 C)-98.5 F (36.9 C)] 98.4 F (36.9 C) (07/15 1210) Pulse Rate:  [78-103] 78 (07/15 1210) Resp:  [16-26] 16 (07/15 1210) BP: (134-166)/(84-99) 136/84 (07/15 1210) SpO2:  [97 %-100 %] 100 % (07/15 1210)  General - Well nourished, well developed, in no apparent distress, flat affect.  Ophthalmologic - fundi not visualized due to noncooperation.  Cardiovascular - Regular rhythm and rate.  Mental Status -  Level of arousal and orientation to time, place, and person were intact. Language including expression, naming, repetition, comprehension was assessed and found intact.  Cranial Nerves II - XII - II - Visual field intact OU. III, IV, VI - Extraocular movements intact. V - Facial sensation intact bilaterally. VII - mild right nasolabial fold flattening. VIII - Hearing & vestibular intact bilaterally. X - Palate elevates symmetrically. XI - Chin turning & shoulder shrug intact bilaterally. XII - Tongue protrusion intact.  Motor Strength - The patient's  strength was normal in all extremities except right pronator drift was present.  Bulk was normal and fasciculations were absent.   Motor Tone - Muscle tone was assessed at the neck and appendages and was normal.  Reflexes - The patient's reflexes were symmetrical in all extremities and he had no pathological reflexes.  Sensory - Light touch, temperature/pinprick were assessed and were symmetrical.    Coordination - The patient had normal movements in the hands with no ataxia or dysmetria.  Tremor was absent.  Gait and Station - deferred.   ASSESSMENT/PLAN Mr. DAMYAN CORNE is a 65 y.o. male with history of cocaine use, hypertension, hyperlipidemia, prediabetes, stroke admitted for s/p fall. No tPA given due to outside window.    Stroke:  left periventricular white matter likely secondary to small vessel disease source CT no acute abnormality MRI left periventricular white matter and left MCA/PCA white  matter punctate infarcts.  Old infarcts at bilateral BG/CR and pons CTA head and neck left A2 stenosis, possible BA moderate stenosis 2D Echo EF 65 to 70% LDL 133 HgbA1c 6.4 UDS negative, but admitted taking cocaine 2 months ago. Lovenox for VTE prophylaxis aspirin 325 mg daily prior to admission but not compliant with medication, now on aspirin 81 mg daily and clopidogrel 75 mg daily DAPT for 3 weeks and then aspirin alone. Patient counseled to be compliant with his antithrombotic medications Ongoing aggressive stroke risk factor management Therapy recommendations: Pending Disposition: Pending  History of stroke 07/2017 MRI left BG/CR acute infarct, old left pontine and right caudate infarct.  MRA and carotid Doppler negative.  A1c 6.2, LDL 114, UDS positive for cocaine and THC, EF 60 to 65%.  Discharged on aspirin and Lipitor, with residual mild left-sided weakness.  Diabetes HgbA1c 6.4 goal < 7.0 Controlled CBG monitoring SSI DM education and close PCP follow  up  Hypertension Stable Long term BP goal normotensive  Hyperlipidemia Home meds: None LDL 133, goal < 70 Now on Lipitor 40 Continue statin at discharge  Other Stroke Risk Factors Advanced age  Other [facility] day # 0  Neurology will sign off. Please call with questions. Pt will follow up with stroke clinic NP at [facility] in about 4 weeks. Thanks for the consult.   Rosalin Hawking, MD PhD Stroke Neurology 03/14/2021 12:46 PM    To contact Stroke Continuity provider, please refer to [email or web address] After hours, contact General Neurology

## 2021-03-14 NOTE — TOC Initial Note (Signed)
Transition of Care [facility]) - Initial/Assessment Note    Patient Details  Name: David [name] MRN: [id number] Date of Birth: Apr 20, 1956  Transition of Care [facility]) CM/SW Contact:    Marilu Favre, RN Phone Number: 03/14/2021, 11:08 AM  Clinical Narrative:                  Spoke to patient at bedside. Patient homeless , address in Epic is [place]. NCM provided resources.   Ordered Rolator with [place]. Expected Discharge Plan: [place]     Patient Goals and CMS Choice     Choice offered to / list presented to : Patient  Expected Discharge Plan and Services Expected Discharge Plan: Homeless Shelter       Living arrangements for the past 2 months: Homeless Shelter                 DME Arranged: Walker rolling with seat DME Agency: [employer] Date DME Agency Contacted: 03/14/21 Time DME Agency Contacted: 1107 Representative spoke with at DME Agency: Freda Munro HH Arranged: NA          Prior Living Arrangements/Services Living arrangements for the past 2 months: Homeless Shelter Lives with:: Self Patient language and need for interpreter reviewed:: Yes Do you feel safe going back to the place where you live?: Yes            Criminal Activity/Legal Involvement Pertinent to Current Situation/Hospitalization: No - Comment as needed  Activities of Daily Living Home Assistive Devices/Equipment: None ADL Screening (condition at time of admission) Patient's cognitive ability adequate to safely complete daily activities?: Yes Is the patient deaf or have difficulty hearing?: No Does the patient have difficulty seeing, even when wearing glasses/contacts?: No Does the patient have difficulty concentrating, remembering, or making decisions?: No Patient able to express need for assistance with ADLs?: Yes Does the patient have difficulty dressing or bathing?: No Independently performs ADLs?: Yes (appropriate for developmental age) Does the patient have difficulty  walking or climbing stairs?: No Weakness of Legs: None Weakness of Arms/Hands: None  Permission Sought/Granted   Permission granted to share information with : No              Emotional Assessment Appearance:: Appears stated age Attitude/Demeanor/Rapport: Engaged Affect (typically observed): Accepting Orientation: : Oriented to Self, Oriented to Place, Oriented to  Time, Oriented to Situation Alcohol / Substance Use: Not Applicable Psych Involvement: No (comment)  Admission diagnosis:  Concussion without loss of consciousness, initial encounter [S06.0X0A] Strain of neck muscle, initial encounter [S16.1XXA] Abrasion of left knee, initial encounter [S80.212A] Laceration of forehead, initial encounter [S01.81XA] Acute CVA (cerebrovascular accident) ([place]) [I63.9] Cerebrovascular accident (CVA), unspecified mechanism ([place]) [I63.9] Patient Active Problem List   Diagnosis Date Noted   Acute CVA (cerebrovascular accident) ([place]) 03/13/2021   Concussion with no loss of consciousness    Laceration of forehead    Strain of right pectoralis muscle 03/14/2020   Bilateral leg weakness 03/14/2020   Numbness of right thumb 10/26/2018   Health care maintenance 10/26/2018   History of stroke 04/29/2018   Hearing impaired 04/29/2018   Erectile dysfunction 09/01/2017   Mild tetrahydrocannabinol (THC) abuse    Slurred speech 08/03/2017   Left hip pain 11/06/2016   Prediabetes 11/06/2016   Labral tear of shoulder 01/25/2014   Adhesive capsulitis of left shoulder 12/05/2013   Trigger point of neck 08/16/2012   Hyperlipidemia 08/08/2012   Elevated LFTs 08/08/2012   Cocaine abuse ([place]) 08/08/2012   Chronic pain  06/01/2012   BPH (benign prostatic hyperplasia) 09/09/2011   ROTATOR CUFF TEAR 12/03/2008   PCP:  Kinnie Feil, MD Pharmacy:   [id number] [address], [place] [address] Phone: [phone] Fax:  [phone]     Social Determinants of Health (SDOH) Interventions    Readmission Risk Interventions No flowsheet data found.

## 2021-03-14 NOTE — Discharge Instructions (Addendum)
Dear David [name],  Thank you for letting us participate in your care. You were hospitalized for fall and found to have a new stroke.  You had some stitches put into your head which will be removed at your follow-up appointment at the family practice.  POST-HOSPITAL & CARE INSTRUCTIONS You were started on a cholesterol medicine called atorvastatin or Lipitor. This will help reduce your chances of having a heart attack or stroke in the future. Take it as prescribed.  You have a follow up appointment with your primary care clinic. Details below.  It will be important to come to this appointment so we can have your stitches removed Go to your follow up appointments (listed below) You were discharged on aspirin and Plavix.  You will take the Plavix for 21 total days and then continue taking the aspirin alone after this.   DOCTOR'S APPOINTMENT   Future Appointments  Date Time Provider [place]  03/21/2021  8:50 AM Lyndee Hensen, DO [facility] [place]    Follow-up Information     Lyndee Hensen, DO. Go on 03/21/2021.   Specialty: Family Medicine Why: At 8:50am. Please arrive by 8:35am. This is your hospital follow up with your primary care clinic. Contact information: [address] [address] [phone]                 Take care and be well!  [facility]  [place], [place] [address] [phone]

## 2021-03-14 NOTE — Plan of Care (Signed)

## 2021-03-14 NOTE — Evaluation (Signed)
Occupational Therapy Evaluation Patient Details Name: David [name] MRN: [id number] DOB: [birth year] Today's Date: 03/14/2021    History of Present Illness Pt is a 65 y.o. male admitted 03/13/21 after fall from tripping on his feet, residual scalp lac, abrasions to L knee and R arm. Head CT with chronic lacunar type infarcts, extensive microvascular angiopathy. MRI with scattered areas of acute infarct in L frontal deep white matter; no ICH. Knee and shoulder imaging negative for acute injury. PMH includes CVA (L basal ganglia infarct in 2018), HTN, HLD, cocaine use.   Clinical Impression   Pt pleasant, presenting with R hemi impairments including coordination, strength and decreased awareness/proprioception during functional transfers. Pt endorsing he is very Indep and living alone on the streets at baseline, currently able to complete functional self care tasks and transfers at Sup level without LOB or concern for injury but requiring increased time and effort for use of dominant R UE. Attempted brief cognitive assessment with score of 4 on SBT, and slightly obscured clock draw test outcome indicating ?insight and perceptual deficits. Observed during session with decreased safety awareness. OT will continue to follow with focus on UE strengthening and coordination activities to increase ease of completion of ADL's     Follow Up Recommendations  Outpatient OT;Other (comment) (anticipate pt will not complete follow up OT as pt is homeless.)    Equipment Recommendations  None recommended by OT    Recommendations for Other Services       Precautions / Restrictions Precautions Precautions: Fall Restrictions Weight Bearing Restrictions: No      Mobility Bed Mobility Overal bed mobility: Modified Independent                  Transfers Overall transfer level: Needs assistance Equipment used: None Transfers: Sit to/from [employer] Transfers Sit to Stand: Supervision Stand  pivot transfers: Supervision       General transfer comment: supervision for safety, cues for modification and attention to R environment at times.    Balance Overall balance assessment: Needs assistance Sitting-balance support: Bilateral upper extremity supported;Single extremity supported;No upper extremity supported;Feet supported Sitting balance-Leahy Scale: Good     Standing balance support: No upper extremity supported;During functional activity Standing balance-Leahy Scale: Fair                             ADL either performed or assessed with clinical judgement   ADL Overall ADL's : Needs assistance/impaired Eating/Feeding: Independent   Grooming: Supervision/safety;Standing   Upper Body Bathing: Set up   Lower Body Bathing: Set up;Sit to/from stand   Upper Body Dressing : Independent   Lower Body Dressing: Supervision/safety;Sit to/from stand   Toilet Transfer: Supervision/safety             General ADL Comments: grossly pt presents wtih Sup level ofr performance, no significant concern for fall during ADL's, but increased time and efforts observed with opening/closing     Vision Baseline Vision/History: No visual deficits Patient Visual Report: No change from baseline Vision Assessment?: Yes     Perception Perception Perception Tested?: Yes Perception Deficits: Inattention/neglect Inattention/Neglect:  (decreased attention to R environment despite no visual impairment present on screen)   Praxis      Pertinent Vitals/Pain Pain Assessment: 0-10 Pain Score: 3  Pain Location: R orbit/laceration site Pain Descriptors / Indicators: Aching;Discomfort;Sore Pain Intervention(s): Monitored during session     Hand Dominance Right   Extremity/Trunk Assessment Upper Extremity  Assessment Upper Extremity Assessment: RUE deficits/detail RUE Deficits / Details: deficits present in strength and coordination RUE Sensation: decreased  proprioception RUE Coordination: decreased gross motor;decreased fine motor   Lower Extremity Assessment Lower Extremity Assessment: Defer to PT evaluation RLE Deficits / Details: AROM hip WFL limits, lacks full knee extension and decreased dorsiflexion, strength grossly 4/5 less RLE Sensation: decreased proprioception RLE Coordination: decreased fine motor   Cervical / Trunk Assessment Cervical / Trunk Assessment: Normal   Communication Communication Communication: No difficulties   Cognition Arousal/Alertness: Awake/alert Behavior During Therapy: WFL for tasks assessed/performed;Flat affect Overall Cognitive Status: No family/caregiver present to determine baseline cognitive functioning                                 General Comments: Pt with slowed processing, inattention especially with peripheral objects in walking, decreased command follow for improving gait and sequencing with cane, difficult to determine if it is a can't verses won't attempt situation   General Comments  wound present to R orbit with sutures    Exercises     Shoulder Instructions      Home Living Family/patient expects to be discharged to:: Shelter/Homeless                                 Additional Comments: endorsing no family or friends available to provide S/A at time of d.c      Prior Functioning/Environment Level of Independence: Independent        Comments: endorsing baseline of indep with completion of all ADL's and functional mobility. Although is open regarding recently increased frequeny of falls over last month +3 not including fall leading to admission        OT Problem List: Decreased strength;Decreased activity tolerance;Impaired balance (sitting and/or standing);Decreased safety awareness;Decreased knowledge of use of DME or AE      OT Treatment/Interventions: Self-care/ADL training;Therapeutic exercise;Neuromuscular education;DME and/or AE  instruction;Therapeutic activities;Balance training    OT Goals(Current goals can be found in the care plan section) Acute Rehab OT Goals Patient Stated Goal: leave today OT Goal Formulation: With patient Time For Goal Achievement: 03/28/21 Potential to Achieve Goals: Good ADL Goals Pt/caregiver will Perform Home Exercise Program: Right Upper extremity;Increased strength;Independently Additional ADL Goal #1: pt will complete full self care morning routine including item retreival and transport at mod indep level  OT Frequency: Min 2X/week   Barriers to D/C:            Co-evaluation              AM-PAC OT "6 Clicks" Daily Activity     Outcome Measure Help from another person eating meals?: None Help from another person taking care of personal grooming?: None Help from another person toileting, which includes using toliet, bedpan, or urinal?: None Help from another person bathing (including washing, rinsing, drying)?: A Little Help from another person to put on and taking off regular upper body clothing?: None Help from another person to put on and taking off regular lower body clothing?: None 6 Click Score: 23   End of Session Equipment Utilized During Treatment: Gait belt Nurse Communication: Mobility status  Activity Tolerance: Patient tolerated treatment well Patient left: in chair;with call bell/phone within reach;with chair alarm set  OT Visit Diagnosis: Unsteadiness on feet (R26.81);Repeated falls (R29.6)  Time: 0335-3317 OT Time Calculation (min): 42 min Charges:  OT General Charges $OT Visit: 1 Visit OT Evaluation $OT Eval Low Complexity: 1 Low OT Treatments $Self Care/Home Management : 8-22 mins  Vilas Edgerly OTR/L acute rehab services Office: [phone]  03/14/2021, 11:05 AM

## 2021-03-14 NOTE — TOC CAGE-AID Note (Addendum)
Transition of Care [facility]) - CAGE-AID Screening   Patient Details  Name: David [name] MRN: [id number] Date of Birth: Feb 15, 1956  Transition of Care [facility]) CM/SW Contact:    Gaetano Hawthorne Tarpley-Carter, [place] Phone Number: 03/14/2021, 3:45 PM   Clinical Narrative: Pt participated in [place].  Pt was offered resources.  Pt stated they did not feel that they were in need of resources at this time.  CSW attached substance use resources in discharge summary for future use if needed.   Velton Roselle Tarpley-Carter, MSW, LCSW-A Pronouns:  She/Her/Hers                          [place] Clinical Social WorkerTransitions of Care Cell:  [phone] [email or web address]   CAGE-AID Screening:    Have You Ever Felt You Ought to Cut Down on Your Drinking or Drug Use?: No Have People Annoyed You By [employer] Your Drinking Or Drug Use?: No Have You Felt Bad Or Guilty About Your Drinking Or Drug Use?: No Have You Ever Had a Drink or Used Drugs First Thing In The Morning to Steady Your Nerves or to Get Rid of a Hangover?: No CAGE-AID Score: 0  Substance Abuse Education Offered: Yes

## 2021-03-14 NOTE — Progress Notes (Signed)
Family Medicine Teaching Service Daily Progress Note Intern Pager: [phone]  Patient name: David [name] Medical record number: [id number] Date of birth: 20-May-1956 Age: 65 y.o. Gender: male  Primary Care Provider: Kinnie Feil, MD Consultants: Neurology Code Status: Full  Pt Overview and Major Events to Date:  7/4 admitted  Assessment and Plan:  [name] is a 65 year old male who presented with recent fall secondary to acute frontal CVA.  He has a history of CVA back in 2018.  Fall 2/2 acute CVA Head CTA on admission showed chronic lacunar type infarct but no large vessel occlusion.  An MRI showed scattered small areas of acute infarct. Physical exam was positive for LLE muscle strength 3/5 with intact sensation  -Neuro consulted, appreciate rec -Start Aspirin 81mg  -Plavix 200 mg -PT/OT consult -Speech consult -Stroke team following  Lacerations from fall Patient had a 4 cm laceration face following fall.  He received 3 stitches. The stitches are clean and dry  Chronic HLD Patient had uncontrolled hyperlipidemia.  Not on any current home medication.  Lipid panel showed elevated LDL of 133 cholesterol 201. -Start atorvastatin 40 mg  Chronic hypertension BP in the last 24 hours have ranged from systolic (742-595) and diastolic (63-875).  Most recent BP was 135/90 -Continue to monitor blood pressure  BPH Continue tamsulosin 0.4 mg daily  FEN/GI: Heart healthy diet PPx: Lovenox Dispo:Home pending clinical improvement . Barriers include patient is homeless.   Subjective:  Patient is sitting comfortably on bedside.  He said he is feeling well and has no complaint at this time.  Denies any pain, headache, or dizziness.  Objective: Temp:  [97.7 F (36.5 C)-98.5 F (36.9 C)] 98.4 F (36.9 C) (07/15 0805) Pulse Rate:  [78-103] 82 (07/15 0805) Resp:  [16-26] 16 (07/15 0805) BP: (134-166)/(87-99) 135/90 (07/15 0805) SpO2:  [97 %-100 %] 100 % (07/15  0805) Physical Exam: General: Sitting on chair, NAD, alert Cardiovascular: RRR, no murmurs Respiratory: Clear breath sounds bilaterally, no wheezes Abdomen: No abdominal distention or tenderness Extremities: Diminished strength on the right extremity 4/5.  Muscle strength 5/5 on the left extremities. Sensation is intact on both right and left extremities. Neuro: cranial nerve II-XII intact   Laboratory: Recent Labs  Lab 03/13/21 0125  WBC 5.2  HGB 12.2*  HCT 36.8*  PLT 234   Recent Labs  Lab 03/13/21 0125  NA 139  K 4.1  CL 107  CO2 25  BUN 21  CREATININE 1.07  CALCIUM 9.0  PROT 7.8  BILITOT 0.3  ALKPHOS 70  ALT 16  AST 28  GLUCOSE 94      Imaging/Diagnostic Tests: No image studies  Alen Bleacher, MD 03/14/2021, 8:51 AM PGY-1, [place] Intern pager: [phone], text pages welcome

## 2021-03-14 NOTE — Evaluation (Signed)
Physical Therapy Evaluation Patient Details Name: David [name] MRN: [id number] DOB: April 18, 1956 Today's Date: 03/14/2021   History of Present Illness  Pt is a 65 y.o. male admitted 03/13/21 after fall from tripping on his feet, residual scalp lac, abrasions to L knee and R arm. Head CT with chronic lacunar type infarcts, extensive microvascular angiopathy. MRI with scattered areas of acute infarct in L frontal deep white matter; no ICH. Knee and shoulder imaging negative for acute injury. PMH includes CVA (L basal ganglia infarct in 2018), HTN, HLD, cocaine use.  Clinical Impression  PTA pt living on street, independent, ambulating a couple miles a day. Pt reports no family assist and does not utilize shelter services. Pt is currently limited in safe mobility by R sided weakness at baseline and possibly greater since current infarcts. Pt also with decreased cognition in terms of decreased awareness of deficits, decreased safety awareness and slowed processing. Pt is mod I for bed mobility, supervision for transfers and contact guard assist for ambulation without AD, pt at supervision level for ambulation with Rollator. Pt will not need any PT services at d/c however would benefit from Rollator to prevent falls in the community. PT will continue to follow acutely.     Follow Up Recommendations No PT follow up;Other (comment) (advised pt to stay around people, and be aware of BE FAST symptoms)    Equipment Recommendations  Other (comment) (Rollator)       Precautions / Restrictions Precautions Precautions: Fall Restrictions Weight Bearing Restrictions: No      Mobility  Bed Mobility Overal bed mobility: Modified Independent                  Transfers Overall transfer level: Needs assistance Equipment used: None Transfers: Sit to/from Stand Sit to Stand: Supervision         General transfer comment: supervision for safety, requires increased effort and time to find static  balance  Ambulation/Gait Ambulation/Gait assistance: Min assist;Supervision Gait Distance (Feet): 350 Feet Assistive device: None;Straight cane;Rolling walker (2 wheeled) Gait Pattern/deviations: Step-through pattern;Decreased step length - left;Decreased dorsiflexion - right;Decreased weight shift to right;Decreased stance time - right;Narrow base of support;Scissoring Gait velocity: slowed Gait velocity interpretation: <1.31 ft/sec, indicative of household ambulator General Gait Details: contact guard assist for ambulation without device pt with narrow BoS, occassionally catching R foot on swing through, very close to running into objects in hallway, contact guard with cane trialed in R and L UE, vc for sequencing gait with cane and wider BoS 1x LoB in turning requiring increased min A for steadying and supervision for ambulation with RW, continues to have narrow BoS and slight scissoring, vc for wider BoS,  Stairs Stairs: Yes Stairs assistance: Min assist Stair Management: One rail Left;Forwards;Step to pattern Number of Stairs: 3 General stair comments: min A and heavy reliance on rail for support to ascend/descend 3 steps        Balance Overall balance assessment: Needs assistance Sitting-balance support: Bilateral upper extremity supported;Single extremity supported;No upper extremity supported;Feet supported Sitting balance-Leahy Scale: Good     Standing balance support: No upper extremity supported;During functional activity Standing balance-Leahy Scale: Fair                               Pertinent Vitals/Pain Pain Assessment: 0-10 Pain Score: 4  Pain Location: R orbit/laceration site Pain Descriptors / Indicators: Aching;Discomfort;Sore Pain Intervention(s): Monitored during session    Home  Living Family/patient expects to be discharged to:: Shelter/Homeless                 Additional Comments: endorsing no family or friends available to provide  S/A at time of d.c    Prior Function Level of Independence: Independent         Comments: endorsing baseline of indep with completion of all ADL's and functional mobility. Although is open regarding recently increased frequeny of falls over last month +3 not including fall leading to admission     Hand Dominance   Dominant Hand: Right    Extremity/Trunk Assessment   Upper Extremity Assessment Upper Extremity Assessment: Defer to OT evaluation    Lower Extremity Assessment Lower Extremity Assessment: RLE deficits/detail RLE Deficits / Details: AROM hip WFL limits, lacks full knee extension and decreased dorsiflexion, strength grossly 4/5 less RLE Sensation: decreased proprioception RLE Coordination: decreased fine motor    Cervical / Trunk Assessment Cervical / Trunk Assessment: Normal  Communication   Communication: No difficulties (No apparent difficulty but limitd hypo-vebal during session with increased time required at times for response)  Cognition Arousal/Alertness: Awake/alert Behavior During Therapy: WFL for tasks assessed/performed Overall Cognitive Status: No family/caregiver present to determine baseline cognitive functioning                                 General Comments: Pt with slowed processing, inattention especially with peripheral objects in walking, decreased command follow for improving gait and sequencing with cane, difficult to determine if it is a can't verses won't attempt situation      General Comments General comments (skin integrity, edema, etc.): wound over R eye with increased edema and stitches, BP WNL        Assessment/Plan    PT Assessment Patient needs continued PT services  PT Problem List Decreased strength;Decreased range of motion;Decreased activity tolerance;Decreased balance;Decreased mobility;Decreased cognition;Decreased coordination;Decreased knowledge of use of DME;Decreased safety awareness;Decreased skin  integrity;Pain       PT Treatment Interventions DME instruction;Gait training;Functional mobility training;Therapeutic activities;Therapeutic exercise;Balance training;Cognitive remediation;Patient/family education    PT Goals (Current goals can be found in the Care Plan section)  Acute Rehab PT Goals Patient Stated Goal: get out of hospital PT Goal Formulation: With patient Time For Goal Achievement: 03/28/21 Potential to Achieve Goals: Fair    Frequency Min 3X/week    AM-PAC PT "6 Clicks" Mobility  Outcome Measure Help needed turning from your back to your side while in a flat bed without using bedrails?: None Help needed moving from lying on your back to sitting on the side of a flat bed without using bedrails?: None Help needed moving to and from a bed to a chair (including a wheelchair)?: None Help needed standing up from a chair using your arms (e.g., wheelchair or bedside chair)?: None Help needed to walk in hospital room?: A Little Help needed climbing 3-5 steps with a railing? : A Little 6 Click Score: 22    End of Session Equipment Utilized During Treatment: Gait belt Activity Tolerance: Patient tolerated treatment well Patient left: in chair;with chair alarm set;Other (comment) (OT giving cognition assessment) Nurse Communication: Mobility status PT Visit Diagnosis: Unsteadiness on feet (R26.81);Repeated falls (R29.6);History of falling (Z91.81);Muscle weakness (generalized) (M62.81);Hemiplegia and hemiparesis;Difficulty in walking, not elsewhere classified (R26.2) Hemiplegia - Right/Left: Right Hemiplegia - dominant/non-dominant: Dominant Hemiplegia - caused by: Cerebral infarction    Time: 2440-1027 PT Time Calculation (min) (ACUTE  ONLY): 22 min   Charges:   PT Evaluation $PT Eval Moderate Complexity: 1 Mod          Yuvonne Lanahan B. Migdalia Dk PT, DPT Acute Rehabilitation Services Pager [phone] Office [phone]   [place] 03/14/2021, 9:44 AM

## 2021-03-15 DIAGNOSIS — I639 Cerebral infarction, unspecified: Secondary | ICD-10-CM | POA: Diagnosis not present

## 2021-03-15 DIAGNOSIS — S0181XA Laceration without foreign body of other part of head, initial encounter: Secondary | ICD-10-CM | POA: Diagnosis not present

## 2021-03-15 NOTE — TOC Transition Note (Signed)
Transition of Care [facility]) - CM/SW Discharge Note   Patient Details  Name: David [name] MRN: [id number] Date of Birth: 17-Jun-1956  Transition of Care [facility]) CM/SW Contact:  Bartholomew Crews, RN Phone Number: [phone] 03/15/2021, 12:41 PM   Clinical Narrative:     Spoke with patient at the bedside to discuss transition planning. He stated that he has nowhere to go. Discussed shelter options - patient is aware. CSW provided coordinated entry number for [facility], and patient advised to call number on Monday.   Patient stated that he called his kids, but no one would offer him a place to stay. Discussed checking himself into a hotel. His check has run out and he doesn't get another check until the first of the month.   Discussed that he declined rollator yesterday. Patient stated that he could not afford the copay.   Discussed discharge medications delivered to bedside earlier this week. Patient pointed to medications on his table.   Offered to get patient a ride to wherever he wanted to go. Patient requested bus pass - provided.   No further TOC needs identified.   Final next level of care: Homeless Shelter     Patient Goals and CMS Choice     Choice offered to / list presented to : Patient  Discharge Placement                       Discharge Plan and Services                DME Arranged: Walker rolling with seat DME Agency: [employer] Date DME Agency Contacted: 03/14/21 Time DME Agency Contacted: 1107 Representative spoke with at DME Agency: Freda Munro [facility] Arranged: NA          Social Determinants of Health ([place]) Interventions     Readmission Risk Interventions No flowsheet data found.

## 2021-03-15 NOTE — Progress Notes (Signed)
Patient has ordered for discharge. Given discharge instructions with paper to the patient. Iv removed. Given all belongings to the patient. Case manager and social worker consulted before patient leave from the unit.

## 2021-03-15 NOTE — Discharge Summary (Signed)
[facility] Discharge Summary  Patient name: David [name] Medical record number: [id number] Date of birth: 1956/04/28 Age: 65 y.o. Gender: male Date of Admission: 03/13/2021  Date of Discharge: 03/15/21 Admitting Physician: Lenoria Chime, MD  Primary Care Provider: Kinnie Feil, MD Consultants: Neurology  Indication for Hospitalization: Fall/stroke  Discharge Diagnoses/Problem List:  Active Problems:   Cerebrovascular accident (CVA) [facility])   Disposition: Homeless shelter  Discharge Condition: Stable  Discharge Exam: General: Alert and oriented in no apparent distress Heart: Regular rate and rhythm with no murmurs appreciated Lungs: CTA bilaterally, no wheezing Abdomen: Bowel sounds present, no abdominal pain Skin: Scalp laceration present on the right side which is well-appearing with no obvious drainage or erythema Neuro: CN II through XII intact, fine touch sensation intact in upper and lower extremities bilaterally, strength 5/5 in upper and lower extremities bilaterally Extremities: No lower extremity edema  Brief Hospital Course:  David [name] is a 65 y.o. male presenting with fall 2/2 acute frontal CVA . PMH is significant for previous CVA in 2018, HTN, HLD, cocaine abuse, BPH.  Acute CVA NIHSS 0. CT with chronic lacunar type infarcts and extensive microvascular angiopathy, MRI with scattered areas of acute infarct in left frontal deep white matter. No intracranial hemmorhage noted on MRI. Neurology consulted in ED, started on DAPT. Evaluated by PT/OT/SLP and no PT follow-up, outpatient OT.   Scalp laceration 3 sutures for scalp laceration in the emergency department.  Wound without signs of infection at time of discharge.  Recommend suture removal in about 1 week, patient has follow-up appointment scheduled at our clinic on 7/22.  HLD Repeat lipid panel demonstrated total cholesterol 201, triglycerides 98, LDL 133. Started on  high-intensity statin, Atorvastatin 40mg .   HTN Not on home anti-hypertensives. Allowed for permissive HTN during admission.   Cocaine Abuse  Homelessness SW consulted for financial concerns and for substance abuse resources.   Discharge recommendations: A1c 6.4 on admission. PCP to monitor. Recommend lifestyle changes.  Started on atorvastatin 40 mg during admission. PCP to monitor for tolerance. Recommend lipid panel recheck in 4-6 weeks. ASA 81 mg, Plavix 75 mg daily x21 days then aspirin alone Follow-up for suture removal on 7/22 at our clinic   Significant Labs and Imaging:  Recent Labs  Lab 03/13/21 0125  WBC 5.2  HGB 12.2*  HCT 36.8*  PLT 234   Recent Labs  Lab 03/13/21 0125  NA 139  K 4.1  CL 107  CO2 25  GLUCOSE 94  BUN 21  CREATININE 1.07  CALCIUM 9.0  ALKPHOS 70  AST 28  ALT 16  ALBUMIN 3.7     Discharge Medications:  Allergies as of 03/15/2021   No Known Allergies      Medication List     STOP taking these medications    Diclofenac Sodium 1 % Crea Replaced by: diclofenac Sodium 1 % Gel       TAKE these medications    acetaminophen 325 MG tablet Commonly known as: TYLENOL Take 2 tablets (650 mg total) by mouth every 4 (four) hours as needed for mild pain (or temp > 37.5 C (99.5 F)).   Aspirin Low Dose 81 MG EC tablet Generic drug: aspirin Take 1 tablet (81 mg total) by mouth daily. Swallow whole. What changed:  medication strength how much to take additional instructions   atorvastatin 80 MG tablet Commonly known as: LIPITOR Take 1 tablet (80 mg total) by mouth daily. What changed: when  to take this   clopidogrel 75 MG tablet Commonly known as: PLAVIX Take 1 tablet (75 mg total) by mouth daily.   diclofenac Sodium 1 % Gel Commonly known as: VOLTAREN Apply to right shoulder area four time sper day Replaces: Diclofenac Sodium 1 % Crea   senna-docusate 8.6-50 MG tablet Commonly known as: Senokot-S Take 1 tablet by mouth  at bedtime as needed for mild constipation.   tamsulosin 0.4 MG Caps capsule Commonly known as: FLOMAX Take 1 capsule (0.4 mg total) by mouth daily.               Durable Medical Equipment  (From admission, onward)           Start     Ordered   03/14/21 1037  For home use only DME 4 wheeled rolling walker with seat  Once       Question:  Patient needs a walker to treat with the following condition  Answer:  Weakness   03/14/21 1037            Discharge Instructions: Please refer to Patient Instructions section of EMR for full details.  Patient was counseled important signs and symptoms that should prompt return to medical care, changes in medications, dietary instructions, activity restrictions, and follow up appointments.   Follow-Up Appointments:  Follow-up Information     Lyndee Hensen, DO. Go on 03/21/2021.   Specialty: Family Medicine Why: At 8:50am. Please arrive by 8:35am. This is your hospital follow up with your primary care clinic. Contact information: [address] [place] [address] [phone]         [facility]. Schedule an appointment as soon as possible for a visit in 1 month(s).   Specialty: Neurology Why: stroke clinic Contact information: [address] [place], [place], [place] 03/15/2021, 9:42 AM PGY-3, [place]

## 2021-03-15 NOTE — Progress Notes (Signed)
Physical Therapy Treatment Patient Details Name: David [name] MRN: [id number] DOB: [birth year] Today's Date: 03/15/2021    History of Present Illness Pt is a 65 y.o. male admitted 03/13/21 after fall from tripping on his feet, residual scalp lac, abrasions to L knee and R arm. Head CT with chronic lacunar type infarcts, extensive microvascular angiopathy. MRI with scattered areas of acute infarct in L frontal deep white matter; no ICH. Knee and shoulder imaging negative for acute injury. PMH includes CVA (L basal ganglia infarct in 2018), HTN, HLD, cocaine use.    PT Comments    The pt was agreeable to session with focus on progression of OOB mobility, understanding of use of DME, and exercises for RLE strength and stability. The pt had no overt LOB at this time, was able to complete a variety of standing exercises to target hip and knee strength, stability, and power. The pt was educated in use of safety features on rollator, was able to demo good understanding without cues by end of session. The pt was educated in fall risk reduction following d/c and was able to verbalize techniques for mobilizing with improved safety. The pt will continue to benefit from skilled PT acutely to further progress independence with dynamic stability such as quick turns and on uneven surfaces.    Follow Up Recommendations  No PT follow up     Equipment Recommendations  Other (comment) (rollator)    Recommendations for Other Services       Precautions / Restrictions Precautions Precautions: Fall Restrictions Weight Bearing Restrictions: No    Mobility  Bed Mobility Overal bed mobility: Modified Independent                  Transfers Overall transfer level: Needs assistance Equipment used: 4-wheeled walker Transfers: Sit to/from Stand;Stand Pivot Transfers Sit to Stand: Supervision Stand pivot transfers: Supervision       General transfer comment: supervision for safety from EOB, cues for  use of rollator with stopping to use the seat during ambulation  Ambulation/Gait Ambulation/Gait assistance: Min guard Gait Distance (Feet): 300 Feet Assistive device: 4-wheeled walker Gait Pattern/deviations: Step-through pattern;Decreased step length - left;Decreased dorsiflexion - right;Decreased weight shift to right;Decreased stance time - right;Narrow base of support;Scissoring Gait velocity: slowed Gait velocity interpretation: 1.31 - 2.62 ft/sec, indicative of limited community ambulator General Gait Details: pt with no overt LOB, minG with cues for improved clearance and maintaining wider BOS. pt only scissoring with steps when asked to complete marching with gait, increased R hip adduction and external rotation with hip flexion.       Balance Overall balance assessment: Needs assistance Sitting-balance support: No upper extremity supported Sitting balance-Leahy Scale: Good     Standing balance support: Bilateral upper extremity supported;During functional activity Standing balance-Leahy Scale: Fair               High level balance activites: Side stepping High Level Balance Comments: pt able to complete with no LOB, but does require BUE support to steady, cues for kinesthetic awareness and positioning.            Cognition Arousal/Alertness: Awake/alert Behavior During Therapy: WFL for tasks assessed/performed;Flat affect Overall Cognitive Status: No family/caregiver present to determine baseline cognitive functioning                                 General Comments: Pt with slowed processing, needing multimodal cues to execute  corrections to mobility accurately. flat affect and minimally conversant during session      Exercises Other Exercises Other Exercises: walking marches 2 x 10 each leg Other Exercises: side stepping with slight bend in knees, BUE supported on rail, 2 x 20 ft each direction Other Exercises: standing hip extension x10 each  leg Other Exercises: standing hip abd x10 each leg    General Comments General comments (skin integrity, edema, etc.): wound over R eye with increased edema and stitches, pt in room with bed alarm going off upon arrival of PT, pt stating he had just returned from bathroom on his own, educated to call for assistance with mobility      Pertinent Vitals/Pain Pain Assessment: No/denies pain Pain Intervention(s): Monitored during session     PT Goals (current goals can now be found in the care plan section) Acute Rehab PT Goals Patient Stated Goal: leave today PT Goal Formulation: With patient Time For Goal Achievement: 03/28/21 Potential to Achieve Goals: Fair Progress towards PT goals: Progressing toward goals    Frequency    Min 3X/week      PT Plan Current plan remains appropriate       AM-PAC PT "6 Clicks" Mobility   Outcome Measure  Help needed turning from your back to your side while in a flat bed without using bedrails?: None Help needed moving from lying on your back to sitting on the side of a flat bed without using bedrails?: None Help needed moving to and from a bed to a chair (including a wheelchair)?: None Help needed standing up from a chair using your arms (e.g., wheelchair or bedside chair)?: None Help needed to walk in hospital room?: A Little Help needed climbing 3-5 steps with a railing? : A Little 6 Click Score: 22    End of Session Equipment Utilized During Treatment: Gait belt Activity Tolerance: Patient tolerated treatment well Patient left: Other (comment);in bed;with bed alarm set Nurse Communication: Mobility status PT Visit Diagnosis: Unsteadiness on feet (R26.81);Repeated falls (R29.6);History of falling (Z91.81);Muscle weakness (generalized) (M62.81);Hemiplegia and hemiparesis;Difficulty in walking, not elsewhere classified (R26.2) Hemiplegia - Right/Left: Right Hemiplegia - dominant/non-dominant: Dominant Hemiplegia - caused by: Cerebral  infarction     Time: 2446-2863 PT Time Calculation (min) (ACUTE ONLY): 16 min  Charges:  $Gait Training: 8-22 mins                     Inocencio Homes, PT, DPT   Acute Rehabilitation Department Pager #: [phone]   Otho Bellows 03/15/2021, 10:54 AM

## 2021-03-21 ENCOUNTER — Other Ambulatory Visit (HOSPITAL_COMMUNITY): Payer: Self-pay

## 2021-03-21 ENCOUNTER — Ambulatory Visit: Payer: Medicare Other | Admitting: Family Medicine

## 2021-03-21 ENCOUNTER — Telehealth (HOSPITAL_COMMUNITY): Payer: Self-pay

## 2021-03-21 NOTE — Telephone Encounter (Signed)
Transitions of Care Pharmacy  ° °Call attempted for a pharmacy transitions of care follow-up. HIPAA appropriate voicemail was left with call back information provided.  ° °Call attempt #1. Will follow-up in 2-3 days.  °  °

## 2021-03-24 ENCOUNTER — Telehealth (HOSPITAL_COMMUNITY): Payer: Self-pay | Admitting: Pharmacist

## 2021-03-24 NOTE — Telephone Encounter (Signed)
Attempted to reach patient, no answer.

## 2021-03-26 ENCOUNTER — Telehealth (HOSPITAL_COMMUNITY): Payer: Self-pay

## 2021-03-26 NOTE — Telephone Encounter (Signed)
Transitions of Care Pharmacy   Call attempted for a pharmacy transitions of care follow-up. HIPAA appropriate voicemail was left with call back information provided.   Call attempt #3. Will no longer attempt follow up for TOC pharmacy.   

## 2021-04-05 ENCOUNTER — Emergency Department (HOSPITAL_COMMUNITY): Payer: Medicare Other

## 2021-04-05 ENCOUNTER — Inpatient Hospital Stay (HOSPITAL_COMMUNITY)
Admission: EM | Admit: 2021-04-05 | Discharge: 2021-04-25 | DRG: 871 | Disposition: A | Discharge status: Skilled Nursing Facility | Payer: Medicare Other | Attending: Family Medicine | Admitting: Family Medicine

## 2021-04-05 ENCOUNTER — Encounter (HOSPITAL_COMMUNITY): Payer: Self-pay

## 2021-04-05 ENCOUNTER — Other Ambulatory Visit: Payer: Self-pay

## 2021-04-05 DIAGNOSIS — E785 Hyperlipidemia, unspecified: Secondary | ICD-10-CM | POA: Diagnosis present

## 2021-04-05 DIAGNOSIS — E860 Dehydration: Secondary | ICD-10-CM | POA: Diagnosis present

## 2021-04-05 DIAGNOSIS — U071 COVID-19: Secondary | ICD-10-CM | POA: Diagnosis not present

## 2021-04-05 DIAGNOSIS — I69354 Hemiplegia and hemiparesis following cerebral infarction affecting left non-dominant side: Secondary | ICD-10-CM | POA: Diagnosis not present

## 2021-04-05 DIAGNOSIS — F1010 Alcohol abuse, uncomplicated: Secondary | ICD-10-CM | POA: Diagnosis present

## 2021-04-05 DIAGNOSIS — G9340 Encephalopathy, unspecified: Secondary | ICD-10-CM | POA: Diagnosis not present

## 2021-04-05 DIAGNOSIS — G928 Other toxic encephalopathy: Secondary | ICD-10-CM | POA: Diagnosis not present

## 2021-04-05 DIAGNOSIS — Z5901 Sheltered homelessness: Secondary | ICD-10-CM | POA: Diagnosis not present

## 2021-04-05 DIAGNOSIS — Z8673 Personal history of transient ischemic attack (TIA), and cerebral infarction without residual deficits: Secondary | ICD-10-CM | POA: Diagnosis not present

## 2021-04-05 DIAGNOSIS — Z1159 Encounter for screening for other viral diseases: Secondary | ICD-10-CM | POA: Diagnosis not present

## 2021-04-05 DIAGNOSIS — K7010 Alcoholic hepatitis without ascites: Secondary | ICD-10-CM | POA: Diagnosis present

## 2021-04-05 DIAGNOSIS — I69922 Dysarthria following unspecified cerebrovascular disease: Secondary | ICD-10-CM

## 2021-04-05 DIAGNOSIS — I959 Hypotension, unspecified: Secondary | ICD-10-CM | POA: Diagnosis not present

## 2021-04-05 DIAGNOSIS — Z7982 Long term (current) use of aspirin: Secondary | ICD-10-CM | POA: Diagnosis not present

## 2021-04-05 DIAGNOSIS — N186 End stage renal disease: Secondary | ICD-10-CM

## 2021-04-05 DIAGNOSIS — S29011A Strain of muscle and tendon of front wall of thorax, initial encounter: Secondary | ICD-10-CM

## 2021-04-05 DIAGNOSIS — F14129 Cocaine abuse with intoxication, unspecified: Secondary | ICD-10-CM | POA: Diagnosis present

## 2021-04-05 DIAGNOSIS — Z9114 Patient's other noncompliance with medication regimen: Secondary | ICD-10-CM

## 2021-04-05 DIAGNOSIS — I6330 Cerebral infarction due to thrombosis of unspecified cerebral artery: Secondary | ICD-10-CM | POA: Diagnosis not present

## 2021-04-05 DIAGNOSIS — G459 Transient cerebral ischemic attack, unspecified: Secondary | ICD-10-CM | POA: Diagnosis not present

## 2021-04-05 DIAGNOSIS — H9190 Unspecified hearing loss, unspecified ear: Secondary | ICD-10-CM | POA: Diagnosis not present

## 2021-04-05 DIAGNOSIS — N3289 Other specified disorders of bladder: Secondary | ICD-10-CM | POA: Diagnosis not present

## 2021-04-05 DIAGNOSIS — R509 Fever, unspecified: Secondary | ICD-10-CM | POA: Diagnosis not present

## 2021-04-05 DIAGNOSIS — R319 Hematuria, unspecified: Secondary | ICD-10-CM | POA: Diagnosis present

## 2021-04-05 DIAGNOSIS — R2681 Unsteadiness on feet: Secondary | ICD-10-CM | POA: Diagnosis not present

## 2021-04-05 DIAGNOSIS — D649 Anemia, unspecified: Secondary | ICD-10-CM | POA: Diagnosis present

## 2021-04-05 DIAGNOSIS — I639 Cerebral infarction, unspecified: Secondary | ICD-10-CM | POA: Diagnosis not present

## 2021-04-05 DIAGNOSIS — R0689 Other abnormalities of breathing: Secondary | ICD-10-CM | POA: Diagnosis not present

## 2021-04-05 DIAGNOSIS — N400 Enlarged prostate without lower urinary tract symptoms: Secondary | ICD-10-CM | POA: Diagnosis present

## 2021-04-05 DIAGNOSIS — A4189 Other specified sepsis: Principal | ICD-10-CM | POA: Diagnosis present

## 2021-04-05 DIAGNOSIS — Z0389 Encounter for observation for other suspected diseases and conditions ruled out: Secondary | ICD-10-CM | POA: Diagnosis not present

## 2021-04-05 DIAGNOSIS — K6289 Other specified diseases of anus and rectum: Secondary | ICD-10-CM | POA: Diagnosis not present

## 2021-04-05 DIAGNOSIS — F1410 Cocaine abuse, uncomplicated: Secondary | ICD-10-CM | POA: Diagnosis present

## 2021-04-05 DIAGNOSIS — K6389 Other specified diseases of intestine: Secondary | ICD-10-CM | POA: Diagnosis not present

## 2021-04-05 DIAGNOSIS — R410 Disorientation, unspecified: Secondary | ICD-10-CM | POA: Diagnosis not present

## 2021-04-05 DIAGNOSIS — K5900 Constipation, unspecified: Secondary | ICD-10-CM | POA: Diagnosis present

## 2021-04-05 DIAGNOSIS — R296 Repeated falls: Secondary | ICD-10-CM | POA: Diagnosis present

## 2021-04-05 DIAGNOSIS — Z7401 Bed confinement status: Secondary | ICD-10-CM | POA: Diagnosis not present

## 2021-04-05 DIAGNOSIS — R7989 Other specified abnormal findings of blood chemistry: Secondary | ICD-10-CM | POA: Diagnosis not present

## 2021-04-05 DIAGNOSIS — Z992 Dependence on renal dialysis: Secondary | ICD-10-CM | POA: Diagnosis not present

## 2021-04-05 DIAGNOSIS — G9389 Other specified disorders of brain: Secondary | ICD-10-CM | POA: Diagnosis present

## 2021-04-05 DIAGNOSIS — I10 Essential (primary) hypertension: Secondary | ICD-10-CM | POA: Diagnosis not present

## 2021-04-05 DIAGNOSIS — G9349 Other encephalopathy: Secondary | ICD-10-CM | POA: Diagnosis present

## 2021-04-05 DIAGNOSIS — R404 Transient alteration of awareness: Secondary | ICD-10-CM | POA: Diagnosis not present

## 2021-04-05 DIAGNOSIS — M436 Torticollis: Secondary | ICD-10-CM | POA: Diagnosis present

## 2021-04-05 DIAGNOSIS — F32A Depression, unspecified: Secondary | ICD-10-CM | POA: Diagnosis not present

## 2021-04-05 DIAGNOSIS — N179 Acute kidney failure, unspecified: Secondary | ICD-10-CM | POA: Diagnosis present

## 2021-04-05 DIAGNOSIS — N289 Disorder of kidney and ureter, unspecified: Secondary | ICD-10-CM | POA: Diagnosis not present

## 2021-04-05 DIAGNOSIS — Z5900 Homelessness unspecified: Secondary | ICD-10-CM

## 2021-04-05 DIAGNOSIS — Z7902 Long term (current) use of antithrombotics/antiplatelets: Secondary | ICD-10-CM

## 2021-04-05 DIAGNOSIS — R000 Tachycardia, unspecified: Secondary | ICD-10-CM | POA: Diagnosis not present

## 2021-04-05 DIAGNOSIS — R7303 Prediabetes: Secondary | ICD-10-CM | POA: Diagnosis not present

## 2021-04-05 DIAGNOSIS — D709 Neutropenia, unspecified: Secondary | ICD-10-CM | POA: Diagnosis not present

## 2021-04-05 DIAGNOSIS — R262 Difficulty in walking, not elsewhere classified: Secondary | ICD-10-CM | POA: Diagnosis not present

## 2021-04-05 DIAGNOSIS — Z79899 Other long term (current) drug therapy: Secondary | ICD-10-CM

## 2021-04-05 DIAGNOSIS — R4182 Altered mental status, unspecified: Secondary | ICD-10-CM | POA: Diagnosis present

## 2021-04-05 DIAGNOSIS — E876 Hypokalemia: Secondary | ICD-10-CM | POA: Diagnosis not present

## 2021-04-05 DIAGNOSIS — R0902 Hypoxemia: Secondary | ICD-10-CM | POA: Diagnosis not present

## 2021-04-05 DIAGNOSIS — A419 Sepsis, unspecified organism: Secondary | ICD-10-CM | POA: Diagnosis not present

## 2021-04-05 DIAGNOSIS — G8929 Other chronic pain: Secondary | ICD-10-CM | POA: Diagnosis present

## 2021-04-05 DIAGNOSIS — E7800 Pure hypercholesterolemia, unspecified: Secondary | ICD-10-CM | POA: Diagnosis not present

## 2021-04-05 HISTORY — DX: Benign lipomatous neoplasm of skin and subcutaneous tissue of head, face and neck: D17.0

## 2021-04-05 HISTORY — DX: Other cerebral infarction due to occlusion or stenosis of small artery: I63.81

## 2021-04-05 LAB — URINALYSIS, ROUTINE W REFLEX MICROSCOPIC
Bacteria, UA: NONE SEEN
Bilirubin Urine: NEGATIVE
Glucose, UA: NEGATIVE mg/dL
Ketones, ur: NEGATIVE mg/dL
Leukocytes,Ua: NEGATIVE
Nitrite: NEGATIVE
Protein, ur: NEGATIVE mg/dL
Specific Gravity, Urine: 1.017 (ref 1.005–1.030)
pH: 6 (ref 5.0–8.0)

## 2021-04-05 LAB — RAPID URINE DRUG SCREEN, HOSP PERFORMED
Amphetamines: NOT DETECTED
Barbiturates: NOT DETECTED
Benzodiazepines: NOT DETECTED
Cocaine: POSITIVE — AB
Opiates: NOT DETECTED
Tetrahydrocannabinol: NOT DETECTED

## 2021-04-05 LAB — CBC WITH DIFFERENTIAL/PLATELET
Abs Immature Granulocytes: 0.01 10*3/uL (ref 0.00–0.07)
Basophils Absolute: 0 10*3/uL (ref 0.0–0.1)
Basophils Relative: 0 %
Eosinophils Absolute: 0 10*3/uL (ref 0.0–0.5)
Eosinophils Relative: 0 %
HCT: 38.5 % — ABNORMAL LOW (ref 39.0–52.0)
Hemoglobin: 12.6 g/dL — ABNORMAL LOW (ref 13.0–17.0)
Immature Granulocytes: 0 %
Lymphocytes Relative: 15 %
Lymphs Abs: 1 10*3/uL (ref 0.7–4.0)
MCH: 28.6 pg (ref 26.0–34.0)
MCHC: 32.7 g/dL (ref 30.0–36.0)
MCV: 87.5 fL (ref 80.0–100.0)
Monocytes Absolute: 1.6 10*3/uL — ABNORMAL HIGH (ref 0.1–1.0)
Monocytes Relative: 24 %
Neutro Abs: 3.9 10*3/uL (ref 1.7–7.7)
Neutrophils Relative %: 61 %
Platelets: 262 10*3/uL (ref 150–400)
RBC: 4.4 MIL/uL (ref 4.22–5.81)
RDW: 15.3 % (ref 11.5–15.5)
WBC: 6.5 10*3/uL (ref 4.0–10.5)
nRBC: 0 % (ref 0.0–0.2)

## 2021-04-05 LAB — COMPREHENSIVE METABOLIC PANEL
ALT: 43 U/L (ref 0–44)
AST: 69 U/L — ABNORMAL HIGH (ref 15–41)
Albumin: 3.6 g/dL (ref 3.5–5.0)
Alkaline Phosphatase: 85 U/L (ref 38–126)
Anion gap: 10 (ref 5–15)
BUN: 28 mg/dL — ABNORMAL HIGH (ref 8–23)
CO2: 22 mmol/L (ref 22–32)
Calcium: 8.9 mg/dL (ref 8.9–10.3)
Chloride: 108 mmol/L (ref 98–111)
Creatinine, Ser: 1.75 mg/dL — ABNORMAL HIGH (ref 0.61–1.24)
GFR, Estimated: 42 mL/min — ABNORMAL LOW (ref >60)
Glucose, Bld: 166 mg/dL — ABNORMAL HIGH (ref 70–99)
Potassium: 4.9 mmol/L (ref 3.5–5.1)
Sodium: 140 mmol/L (ref 135–145)
Total Bilirubin: 0.8 mg/dL (ref 0.3–1.2)
Total Protein: 8.1 g/dL (ref 6.5–8.1)

## 2021-04-05 LAB — PROTIME-INR
INR: 1.1 (ref 0.8–1.2)
Prothrombin Time: 14.3 seconds (ref 11.4–15.2)

## 2021-04-05 LAB — RESP PANEL BY RT-PCR (FLU A&B, COVID) ARPGX2
Influenza A by PCR: NEGATIVE
Influenza B by PCR: NEGATIVE
SARS Coronavirus 2 by RT PCR: POSITIVE — AB

## 2021-04-05 LAB — ETHANOL: Alcohol, Ethyl (B): <10 mg/dL (ref <10)

## 2021-04-05 LAB — APTT: aPTT: 27 seconds (ref 24–36)

## 2021-04-05 LAB — LACTIC ACID, PLASMA
Lactic Acid, Venous: 3.9 mmol/L (ref 0.5–1.9)
Lactic Acid, Venous: 3.6 mmol/L (ref 0.5–1.9)

## 2021-04-05 LAB — CK: Total CK: 368 U/L (ref 49–397)

## 2021-04-05 MED ORDER — SODIUM CHLORIDE 0.9 % IV SOLN
2.0000 g | Freq: Once | INTRAVENOUS | Status: AC
  Administered 2021-04-05: 2 g via INTRAVENOUS
  Filled 2021-04-05: qty 2

## 2021-04-05 MED ORDER — LACTATED RINGERS IV BOLUS (SEPSIS)
1000.0000 mL | Freq: Once | INTRAVENOUS | Status: AC
  Administered 2021-04-05: 1000 mL via INTRAVENOUS

## 2021-04-05 MED ORDER — LACTATED RINGERS IV BOLUS (SEPSIS)
1000.0000 mL | Freq: Once | INTRAVENOUS | Status: AC
Start: 2021-04-05 — End: 2021-04-05
  Administered 2021-04-05: 1000 mL via INTRAVENOUS

## 2021-04-05 MED ORDER — TAMSULOSIN HCL 0.4 MG PO CAPS
0.4000 mg | ORAL_CAPSULE | Freq: Every day | ORAL | Status: DC
  Administered 2021-04-06 – 2021-04-25 (×20): 0.4 mg via ORAL
  Filled 2021-04-05 (×20): qty 1

## 2021-04-05 MED ORDER — ATORVASTATIN CALCIUM 80 MG PO TABS
80.0000 mg | ORAL_TABLET | Freq: Every day | ORAL | Status: DC
  Administered 2021-04-06 – 2021-04-10 (×5): 80 mg via ORAL
  Filled 2021-04-05: qty 1
  Filled 2021-04-05: qty 2
  Filled 2021-04-05 (×4): qty 1

## 2021-04-05 MED ORDER — LACTATED RINGERS IV BOLUS (SEPSIS)
250.0000 mL | Freq: Once | INTRAVENOUS | Status: AC
  Administered 2021-04-05: 250 mL via INTRAVENOUS

## 2021-04-05 MED ORDER — VANCOMYCIN HCL 1250 MG/250ML IV SOLN
1250.0000 mg | Freq: Once | INTRAVENOUS | Status: AC
  Administered 2021-04-05: 1250 mg via INTRAVENOUS
  Filled 2021-04-05: qty 250

## 2021-04-05 MED ORDER — ACETAMINOPHEN 650 MG RE SUPP
650.0000 mg | Freq: Once | RECTAL | Status: AC
  Administered 2021-04-05: 650 mg via RECTAL
  Filled 2021-04-05: qty 1

## 2021-04-05 MED ORDER — LACTATED RINGERS IV SOLN: INTRAVENOUS | Status: AC

## 2021-04-05 MED ORDER — ASPIRIN EC 81 MG PO TBEC
81.0000 mg | DELAYED_RELEASE_TABLET | Freq: Every day | ORAL | Status: DC
  Administered 2021-04-06 – 2021-04-25 (×20): 81 mg via ORAL
  Filled 2021-04-05 (×20): qty 1

## 2021-04-05 MED ORDER — METRONIDAZOLE 500 MG/100ML IV SOLN
500.0000 mg | Freq: Once | INTRAVENOUS | Status: AC
  Administered 2021-04-05: 500 mg via INTRAVENOUS
  Filled 2021-04-05: qty 100

## 2021-04-05 MED ORDER — SODIUM CHLORIDE 0.9 % IV BOLUS
1000.0000 mL | Freq: Once | INTRAVENOUS | Status: AC
  Administered 2021-04-05: 1000 mL via INTRAVENOUS

## 2021-04-05 MED ORDER — CLOPIDOGREL BISULFATE 75 MG PO TABS: 75.0000 mg | ORAL_TABLET | Freq: Every day | ORAL | Status: DC

## 2021-04-05 NOTE — H&P (Addendum)
[facility] Admission History and Physical Service Pager: [phone]  Patient name: David [name] Medical record number: [id number] Date of birth: Jul 26, 1956 Age: 65 y.o. Gender: male  Primary Care Provider: Kinnie Feil, MD Consultants:  Code Status: full  Preferred Emergency Contact:  Contact Information     Name Relation Home Work [place] Son   [phone]   Vanden, Fawaz   [place], [place] Significant other   [place], [place] Daughter   [phone]        Chief Complaint: Altered mental status  Assessment and Plan: David [name] is a 65 y.o. male presenting with altered mental status. PMH is significant for CVA, falls, hyperlipidemia, hypertension, prediabetes, BPH, substance use disorder.  AMS David [name] is a 65 year old male who presents with altered mental status, level 5 caveat. Patient's baseline is not known. Patient was recently admitted from 03/13/21-03/15/21 for new CVA. Today patient was noted to have fallen which was witnessed by bystanders today. In the ER was oriented to full name, year and location. Temperature with EMS noted to be 106.6.  Vital signs in the ED: Rectal temp 102.9 BP 174/101, HR 124, sats 98 on RA, RR 35.  On exam: pt is somnolent, able to follow commands at times, rigoring and tacypneac. Labs: Cr 1.75, AST 69, WBC 6.5, Hb 12.6, LA 3.9, CK 368, Ethanol negative.  TSH on previous admission wnl. UDS positive for cocaine. UA: Small Hb. CT head: No acute abnormalities. CXR: neg. Broad differential for altered mental status including CVA, new COVID infection and drug intoxication. ED provider spoke with Dr. Leonel Ramsay neurology who recommended continuing secondary stroke prevention per previous hospital discharge and if no improvement in mental status following treatment of COVID can repeat MRI brain. Will order MRI brain now due to high likelihood of CVA recurrence given patient's  hx.  -Admit to FPTS, progressive, attending Dr. Gwendlyn Deutscher -Vitals per protocol -Up with assistance -Continuous telemetry -Treat COVID infection, see below  -F/u ammonia, tylenol, salicylic acid levels -F/u MRI brain  -Trend lactic acids until normal -Neurochecks every 1 hourly -N.p.o. until passed bedside swallow -PT/OT a.m. -TOC consult -Lovenox for DVT ppx -Delirium precautions  -Formal med rec  Sepsis 2/2 COVID infection  Patient found to be COVID-positive on admission. Pt denies having had any of the covid vaccines. CXR: negative, low suspicion for bacterial infection. In the ER sats 100% on 2L, 94% on RA when I was at bedside with patient. On exam pt notably tachypneac, lungs clear anteriorly.  S/p cefepime, metronidazole, vancomycin, 3 L LR & 1 L normal saline.   -Monitor respiratory status --F/u blood cultures and urine cultures -Monitor fever curve -Tylenol 650mg  Q6HPRN suppository  -Continue IV Vancomycin, Cefipime and Flagyl, can d/c if cultures negative  -Remdesivir per pharmacy given pt's multiple co-morbidities  -Can start Decadron if pt becomes hypoxic   AKI Cr 1.75 on admission, baseline is 1-1.2. Likely 2/2 dehydration in setting of COVID infection.  -Monitor with BMP -Avoid nephrotoxic agents -Continue LR 150cc/hr   Recent CVA Patient was recently discharged from hospital following acute CVA.  Found to have chronic lacunar type infarcts and extensive microvascular angiopathy.  MRI showed acute infarct in left frontal deep white matter.  Neurology was consulted, patient was started on DAPT at this time: 81 mg aspirin, 75 mg Plavix daily for 21 days then aspirin alone. Unsure of patient's compliance with medication. -Continue ASA 81mg  only  -  F/u MRI brain   Cocaine abuse  Homelessness UDS positive for cocaine on admission.  ER provider contacted son Linton Rump who reports he has been unable to locate his father since previous hospital discharge 2 weeks ago and that  he is homeless and likely living in a shelter. -Monitor COWs -TOC consult  Anemia  Hb 12.6, no obvious sources of bleeding. No colonoscopy report on file. Pt did now show for previous colonoscopy in 2016. -Monitor CBC -Encourage colonoscopy as an outpatient   HLD Most recent lipid panel on 7 total cholesterol 201, triglycerides 98, LDL 133. Home meds: Atorvastatin 80 mg.  Compliance is unclear -Continue atorvastatin   HTN BP on admission 174/101. Not on any home antihypertensives -Continue to monitor -Consider starting antihypertensive as an outpatient   Prediabetes Last A1c was 6.4 on 03/14/20  -Continue to monitor as an outpatient  BPH Home meds: Tamsulosin 0.4 mg daily -Continue tamsulosin  Recurrent falls -Falls precautions  FEN/GI: N.p.o. LR  Prophylaxis: Lovenox   Disposition: Progressive  History of Present Illness:  David [name] is a 65 y.o. male presenting with AMS.  Patient level 5 caveat in the ED. Limited history from patient: he was able to tell me his name, year and location and that he lives on the streets. He denies dypsnea, chest pain. Feels cold. Denies covid vaccinations. Denies illit drug use. ED provider called his son Linton Rump who notes that he has not been able to location his father since discharge.   Review Of Systems: Per HPI with the following additions:  Review of Systems   Patient Active Problem List   Diagnosis Date Noted   Altered mental status 04/05/2021   Cerebrovascular accident (CVA) ([place]) 03/13/2021   Concussion with no loss of consciousness    Laceration of forehead    Strain of right pectoralis muscle 03/14/2020   Bilateral leg weakness 03/14/2020   Numbness of right thumb 10/26/2018   Health care maintenance 10/26/2018   History of stroke 04/29/2018   Hearing impaired 04/29/2018   Erectile dysfunction 09/01/2017   Mild tetrahydrocannabinol (THC) abuse    Slurred speech 08/03/2017   Left hip pain 11/06/2016   Prediabetes  11/06/2016   Labral tear of shoulder 01/25/2014   Adhesive capsulitis of left shoulder 12/05/2013   Trigger point of neck 08/16/2012   Hyperlipidemia 08/08/2012   Elevated LFTs 08/08/2012   Cocaine abuse ([place]) 08/08/2012   Chronic pain 06/01/2012   BPH (benign prostatic hyperplasia) 09/09/2011   ROTATOR CUFF TEAR 12/03/2008    Past Medical History: Past Medical History:  Diagnosis Date   AKI (acute kidney injury) ([place]) 09/01/2017   Benign prostatic hyperplasia    Cerebral infarction ([place]) 08/04/2017    Past Surgical History: Past Surgical History:  Procedure Laterality Date   SHOULDER ARTHROSCOPY W/ ROTATOR CUFF REPAIR  10/21/10   At [place], R shoulder    Social History: Social History   Tobacco Use   Smoking status: Never   Smokeless tobacco: Never  Substance Use Topics   Alcohol use: No    Comment: occasion   Drug use: No   Additional social history:   Please also refer to relevant sections of EMR.  Family History: History reviewed. No pertinent family history. (If not completed, MUST add something in)  Allergies and Medications: No Known Allergies No current facility-administered medications on file prior to encounter.   Current Outpatient Medications on File Prior to Encounter  Medication Sig Dispense Refill   acetaminophen (TYLENOL) 325 MG  tablet Take 2 tablets (650 mg total) by mouth every 4 (four) hours as needed for mild pain (or temp > 37.5 C (99.5 F)).     aspirin 81 MG EC tablet Take 1 tablet (81 mg total) by mouth daily. Swallow whole. 30 tablet 1   atorvastatin (LIPITOR) 80 MG tablet Take 1 tablet (80 mg total) by mouth daily. 30 tablet 1   clopidogrel (PLAVIX) 75 MG tablet Take 1 tablet (75 mg total) by mouth daily. 21 tablet 0   diclofenac Sodium (VOLTAREN) 1 % GEL Apply to right shoulder area four time sper day 120 g 1   senna-docusate (SENOKOT-S) 8.6-50 MG tablet Take 1 tablet by mouth at bedtime as needed for mild constipation.     tamsulosin  (FLOMAX) 0.4 MG CAPS capsule Take 1 capsule (0.4 mg total) by mouth daily. 30 capsule 1    Objective: BP (!) 156/99   Pulse 99   Temp 99.9 F (37.7 C) (Oral)   Resp (!) 27   Ht 5\' 10"  (1.778 m)   Wt 68 kg   SpO2 97%   BMI 21.51 kg/m   Exam: General: appears older than stated age, somnolent, able to follow basic commands at times  Eyes: PERRLA 50mm bilaterally, no scleral icterus  ENTM: poor dentition, no thyromegaly, lymphadenopathy Neck: supple Cardiovascular: tachycardic  Respiratory: CTAB anteriorly, tachpneac Gastrointestinal: abdomen soft non tender, bowel sounds present  MSK: no obvious deformities Derm: warm and dry  Neuro: ANO to name, location and year. Able to squeeze and open my eyes Psych: unable to assess  Labs and Imaging: CBC BMET  Recent Labs  Lab 04/05/21 2015  WBC 6.5  HGB 12.6*  HCT 38.5*  PLT 262   Recent Labs  Lab 04/05/21 2015  NA 140  K 4.9  CL 108  CO2 22  BUN 28*  CREATININE 1.75*  GLUCOSE 166*  CALCIUM 8.9     EKG: My own interpretation (not copied from electronic read) Sinus tachy     Lattie Haw, MD 04/06/2021, 1:20 AM PGY-3, [facility] pager: [phone], text pages welcome

## 2021-04-05 NOTE — ED Notes (Signed)
PA aware of inability to obtain EKG due to shaking. Per PA, will obtain EKG once fever is treated.

## 2021-04-05 NOTE — ED Notes (Signed)
External urinary cath applied, secured with stat-lock.

## 2021-04-05 NOTE — ED Notes (Signed)
Son - Linton Rump - other number and can leave voicemail [phone]

## 2021-04-05 NOTE — ED Notes (Signed)
Called lab and requested for add on - CK.

## 2021-04-05 NOTE — ED Provider Notes (Signed)
[facility] EMERGENCY DEPARTMENT Provider Note   CSN: [id number] Arrival date & time: 04/05/21  2007     History Chief Complaint  Patient presents with   Altered Mental Status   hypotensive    David [name] is a 65 y.o. male with a past medical history significant for history of CVA in 2018 and last month, hypertension, hyperlipidemia, cocaine abuse, and BPH who presents to the ED due to altered mental status.  Per triage note, bystander saw patient fall.  EMS noted patient to be altered only oriented to self with a strong smell of urine.  Temporal temperature of 106.6. 500 IVFs given prior to arrival. During initial evaluation, patient with rigors, but able to answer where he is, full name, and year. He admits to some shortness of breath. No chest pain. Admits to mild headache.  Chart reviewed.  Patient was recently admitted to hospital on 7/14 ED 7/16 due to acute CVA.  She was discharged on ASA 81 mg and Plavix 75 mg.   Discussed with patient's son, David [name], who notes he has been unable to locate his father since his discharge from the hospital 2 weeks ago. Patient is currently homeless.  Level 5 caveat secondary to altered mental status.     Past Medical History:  Diagnosis Date   AKI (acute kidney injury) ([place]) 09/01/2017   Benign prostatic hyperplasia    Cerebral infarction ([place]) 08/04/2017    Patient Active Problem List   Diagnosis Date Noted   Cerebrovascular accident (CVA) ([place]) 03/13/2021   Concussion with no loss of consciousness    Laceration of forehead    Strain of right pectoralis muscle 03/14/2020   Bilateral leg weakness 03/14/2020   Numbness of right thumb 10/26/2018   Health care maintenance 10/26/2018   History of stroke 04/29/2018   Hearing impaired 04/29/2018   Erectile dysfunction 09/01/2017   Mild tetrahydrocannabinol (THC) abuse    Slurred speech 08/03/2017   Left hip pain 11/06/2016   Prediabetes 11/06/2016   Labral tear of  shoulder 01/25/2014   Adhesive capsulitis of left shoulder 12/05/2013   Trigger point of neck 08/16/2012   Hyperlipidemia 08/08/2012   Elevated LFTs 08/08/2012   Cocaine abuse ([place]) 08/08/2012   Chronic pain 06/01/2012   BPH (benign prostatic hyperplasia) 09/09/2011   ROTATOR CUFF TEAR 12/03/2008    Past Surgical History:  Procedure Laterality Date   SHOULDER ARTHROSCOPY W/ ROTATOR CUFF REPAIR  10/21/10   At [place], R shoulder       History reviewed. No pertinent family history.  Social History   Tobacco Use   Smoking status: Never   Smokeless tobacco: Never  Substance Use Topics   Alcohol use: No    Comment: occasion   Drug use: No    Home Medications Prior to Admission medications   Medication Sig Start Date End Date Taking? Authorizing Provider  acetaminophen (TYLENOL) 325 MG tablet Take 2 tablets (650 mg total) by mouth every 4 (four) hours as needed for mild pain (or temp > 37.5 C (99.5 F)). 03/14/21   Ezequiel Essex, MD  aspirin 81 MG EC tablet Take 1 tablet (81 mg total) by mouth daily. Swallow whole. 03/15/21   Ezequiel Essex, MD  atorvastatin (LIPITOR) 80 MG tablet Take 1 tablet (80 mg total) by mouth daily. 03/15/21   Ezequiel Essex, MD  clopidogrel (PLAVIX) 75 MG tablet Take 1 tablet (75 mg total) by mouth daily. 03/15/21   Ezequiel Essex, MD  diclofenac Sodium (VOLTAREN)  1 % GEL Apply to right shoulder area four time sper day 03/14/21   Ezequiel Essex, MD  senna-docusate (SENOKOT-S) 8.6-50 MG tablet Take 1 tablet by mouth at bedtime as needed for mild constipation. 03/14/21   Ezequiel Essex, MD  tamsulosin (FLOMAX) 0.4 MG CAPS capsule Take 1 capsule (0.4 mg total) by mouth daily. 03/14/21   Ezequiel Essex, MD    Allergies    Patient has no known allergies.  Review of Systems   Review of Systems  Unable to perform ROS: Mental status change  Respiratory:  Positive for shortness of breath.   Neurological:  Positive for headaches.   Physical Exam Updated  Vital Signs BP (!) 160/107   Pulse (!) 103   Temp (S) (!) 102.9 F (39.4 C) (Rectal)   Resp (!) 28   Ht 5\' 10"  (1.778 m)   Wt 68 kg   SpO2 95%   BMI 21.51 kg/m   Physical Exam Vitals and nursing note reviewed.  Constitutional:      General: He is not in acute distress.    Appearance: He is ill-appearing.     Comments: rigors  HENT:     Head: Normocephalic.     Comments: Well healing forehead laceration Eyes:     Pupils: Pupils are equal, round, and reactive to light.  Neck:     Comments: Patient able to touch chin to chest Cardiovascular:     Rate and Rhythm: Regular rhythm. Tachycardia present.     Pulses: Normal pulses.     Heart sounds: Normal heart sounds. No murmur heard.   No friction rub. No gallop.  Pulmonary:     Effort: Pulmonary effort is normal.     Breath sounds: Normal breath sounds.  Abdominal:     General: Abdomen is flat. There is no distension.     Palpations: Abdomen is soft.     Tenderness: There is no abdominal tenderness. There is no guarding or rebound.  Musculoskeletal:        General: Normal range of motion.     Cervical back: Neck supple.  Skin:    General: Skin is warm and dry.  Neurological:     General: No focal deficit present.     Mental Status: He is alert.     Comments: He is able to answer where he is, full name, and year No facial droop Very quiet voice Drowsy on exam and needs to be aroused to answer questions  Equal grip strength  Psychiatric:        Mood and Affect: Mood normal.        Behavior: Behavior normal.    ED Results / Procedures / Treatments   Labs (all labs ordered are listed, but only abnormal results are displayed) Labs Reviewed  RESP PANEL BY RT-PCR (FLU A&B, COVID) ARPGX2 - Abnormal; Notable for the following components:      Result Value   SARS Coronavirus 2 by RT PCR POSITIVE (*)    All other components within normal limits  LACTIC ACID, PLASMA - Abnormal; Notable for the following components:    Lactic Acid, Venous 3.9 (*)    All other components within normal limits  COMPREHENSIVE METABOLIC PANEL - Abnormal; Notable for the following components:   Glucose, Bld 166 (*)    BUN 28 (*)    Creatinine, Ser 1.75 (*)    AST 69 (*)    GFR, Estimated 42 (*)    All other components within normal limits  CBC WITH DIFFERENTIAL/PLATELET - Abnormal; Notable for the following components:   Hemoglobin 12.6 (*)    HCT 38.5 (*)    Monocytes Absolute 1.6 (*)    All other components within normal limits  URINALYSIS, ROUTINE W REFLEX MICROSCOPIC - Abnormal; Notable for the following components:   Hgb urine dipstick SMALL (*)    All other components within normal limits  RAPID URINE DRUG SCREEN, HOSP PERFORMED - Abnormal; Notable for the following components:   Cocaine POSITIVE (*)    All other components within normal limits  CULTURE, BLOOD (SINGLE)  URINE CULTURE  CULTURE, BLOOD (ROUTINE X 2)  CULTURE, BLOOD (ROUTINE X 2)  PROTIME-INR  APTT  ETHANOL  CK  LACTIC ACID, PLASMA  MISCELLANEOUS GENETIC TEST  CK    EKG EKG Interpretation  Date/Time:  Saturday April 05 2021 21:27:06 EDT Ventricular Rate:  109 PR Interval:  143 QRS Duration: 77 QT Interval:  328 QTC Calculation: 442 R Axis:   62 Text Interpretation: Sinus tachycardia rate is faster compared to July 2022 Confirmed by Sherwood Gambler [phone]) on 04/05/2021 9:29:52 PM  Radiology CT HEAD WO CONTRAST (5MM)  Result Date: 04/05/2021 CLINICAL DATA:  Recent fall with altered mental status and fevers EXAM: CT HEAD WITHOUT CONTRAST TECHNIQUE: Contiguous axial images were obtained from the base of the skull through the vertex without intravenous contrast. COMPARISON:  03/13/2021 FINDINGS: Brain: No evidence of acute infarction, hemorrhage, hydrocephalus, extra-axial collection or mass lesion/mass effect. Chronic white matter ischemic changes are noted. Additionally basal ganglia infarcts are seen bilaterally stable from the prior exam.  Lacunar infarct is also noted within the left pons also stable from the prior exam. Vascular: No hyperdense vessel or unexpected calcification. Skull: Normal. Negative for fracture or focal lesion. Sinuses/Orbits: No acute finding. Other: Left scalp lipoma is noted and stable. IMPRESSION: No acute abnormality noted. Chronic ischemic changes are noted. Electronically Signed   By: Inez Catalina M.D.   On: 04/05/2021 23:13   DG Chest Port 1 View  Result Date: 04/05/2021 CLINICAL DATA:  Questionable sepsis. EXAM: PORTABLE CHEST 1 VIEW COMPARISON:  March 12, 2020 FINDINGS: The heart size and mediastinal contours are within normal limits. Both lungs are clear. The visualized skeletal structures are unremarkable. IMPRESSION: No active disease. Electronically Signed   By: Dahlia Bailiff MD   On: 04/05/2021 20:39    Procedures .Suture Removal  Date/Time: 04/05/2021 11:02 PM Performed by: Suzy Bouchard, PA-C Authorized by: Suzy Bouchard, PA-C   Consent:    Consent obtained:  Verbal   Consent given by:  Patient   Risks, benefits, and alternatives were discussed: yes     Risks discussed:  Bleeding, pain and wound separation   Alternatives discussed:  No treatment and delayed treatment Universal protocol:    Procedure explained and questions answered to patient or proxy's satisfaction: yes     Immediately prior to procedure, a time out was called: yes     Patient identity confirmed:  Verbally with patient Location:    Location:  Head/neck   Head/neck location:  Forehead Procedure details:    Wound appearance:  No signs of infection   Number of sutures removed:  2   Number of staples removed:  2 Post-procedure details:    Post-removal:  No dressing applied   Procedure completion:  Tolerated well, no immediate complications .Critical Care  Date/Time: 04/05/2021 11:36 PM Performed by: Suzy Bouchard, PA-C Authorized by: Suzy Bouchard, PA-C   Critical care provider  statement:     Critical care time (minutes):  38   Critical care time was exclusive of:  Separately billable procedures and treating other patients   Critical care was necessary to treat or prevent imminent or life-threatening deterioration of the following conditions:  Sepsis   Critical care was time spent personally by me on the following activities:  Discussions with consultants, evaluation of patient's response to treatment, examination of patient, ordering and performing treatments and interventions, ordering and review of laboratory studies, ordering and review of radiographic studies, pulse oximetry, re-evaluation of patient's condition, obtaining history from patient or surrogate and review of old charts   Medications Ordered in ED Medications  lactated ringers infusion ( Intravenous New Bag/Given 04/05/21 2149)  vancomycin (VANCOREADY) IVPB 1250 mg/250 mL (1,250 mg Intravenous New Bag/Given 04/05/21 2221)  acetaminophen (TYLENOL) suppository 650 mg (650 mg Rectal Given 04/05/21 2049)  sodium chloride 0.9 % bolus 1,000 mL (0 mLs Intravenous Stopped 04/05/21 2123)  lactated ringers bolus 1,000 mL (0 mLs Intravenous Stopped 04/05/21 2145)    And  lactated ringers bolus 1,000 mL (0 mLs Intravenous Stopped 04/05/21 2149)    And  lactated ringers bolus 250 mL (0 mLs Intravenous Stopped 04/05/21 2219)  ceFEPIme (MAXIPIME) 2 g in sodium chloride 0.9 % 100 mL IVPB (0 g Intravenous Stopped 04/05/21 2117)  metroNIDAZOLE (FLAGYL) IVPB 500 mg (0 mg Intravenous Stopped 04/05/21 2219)    ED Course  I have reviewed the triage vital signs and the nursing notes.  Pertinent labs & imaging results that were available during my care of the patient were reviewed by me and considered in my medical decision making (see chart for details).  Clinical Course as of 04/05/21 2336  Sat Apr 05, 2021  2032 Code sepsis initiated after initial evaluation. IVFs and antibiotics started for unknown source.  [CA]  2206 Creatinine(!): 1.75 [CA]   2212 BUN(!): 28 [CA]  2212 Lactic Acid, Venous(!!): 3.9 [CA]  2223 SARS Coronavirus 2 by RT PCR(!): POSITIVE [CA]    Clinical Course User Index [CA] Suzy Bouchard, PA-C   MDM Rules/Calculators/A&P                          65 year old male presents to the ED due to altered mental status.  Upon arrival, patient febrile, tachycardic, and tachypneic.  Code sepsis initiated after initial evaluation.  Patient drowsy and hard to arouse during examination however, able to answer where he is, his full name, and year.  Sepsis labs ordered.  CT head due to altered mental status.  IV fluids and antibiotics started.  Discussed case with Dr. Regenia Skeeter who evaluated patient at bedside and agrees with assessment and plan. Patient also appears tremulous on exam. Patient denies alcohol use. Per previous note from last admission, he denied alcohol use then as well.  CBC with no leukocytosis.  Mild anemia with hemoglobin 12.6.  CMP significant for AKI with creatinine 1.75 and BUN at 28.  COVID positive.  Take acid elevated at 3.9. 30cc/kg of IVFs already given in addition to antibiotics for unknown source.  Chest x-ray negative for any acute abnormalities.  No evidence of pneumonia.  UA significant for small hematuria.  No signs of infection. CT head negative for any acute abnormalities. UDS positive for cocaine.  Given patient's altered mental status, will consult family practice for admission. Fever could be related to COVID infection.  Discussed with family practice who agrees to admit patient. Dr  requesting to contact neurology given AMS and recent stroke. Discussed with Dr. Leonel Ramsay with neurology who recommends continued secondary stroke prevention as outline in discharge summary. Possible encephalopathy related to COVID. If no improvement in mental status, can repeat MRI brain.   David [name] was evaluated in Emergency Department on 04/05/2021 for the symptoms described in the history of present  illness. He was evaluated in the context of the global COVID-19 pandemic, which necessitated consideration that the patient might be at risk for infection with the SARS-CoV-2 virus that causes COVID-19. Institutional protocols and algorithms that pertain to the evaluation of patients at risk for COVID-19 are in a state of rapid change based on information released by regulatory bodies including the CDC and federal and state organizations. These policies and algorithms were followed during the patient's care in the ED.  Final Clinical Impression(s) / ED Diagnoses Final diagnoses:  Altered mental status, unspecified altered mental status type    Rx / DC Orders ED Discharge Orders     None        Karie Kirks 04/05/21 2337    Sherwood Gambler, MD 04/06/21 2342

## 2021-04-05 NOTE — ED Triage Notes (Signed)
Brought in by [facility] - bystander saw pt fell - pt altered (only oriented to self) with strong smell of urine. Temp of 106.6 , 500 fluids received. G20 left forearm.

## 2021-04-06 ENCOUNTER — Inpatient Hospital Stay (HOSPITAL_COMMUNITY): Payer: Medicare Other

## 2021-04-06 ENCOUNTER — Encounter (HOSPITAL_COMMUNITY): Payer: Medicare Other

## 2021-04-06 DIAGNOSIS — N289 Disorder of kidney and ureter, unspecified: Secondary | ICD-10-CM | POA: Diagnosis not present

## 2021-04-06 DIAGNOSIS — U071 COVID-19: Secondary | ICD-10-CM | POA: Diagnosis not present

## 2021-04-06 DIAGNOSIS — A4189 Other specified sepsis: Secondary | ICD-10-CM | POA: Diagnosis not present

## 2021-04-06 DIAGNOSIS — I6330 Cerebral infarction due to thrombosis of unspecified cerebral artery: Secondary | ICD-10-CM | POA: Diagnosis not present

## 2021-04-06 DIAGNOSIS — R4182 Altered mental status, unspecified: Secondary | ICD-10-CM | POA: Diagnosis not present

## 2021-04-06 LAB — COMPREHENSIVE METABOLIC PANEL
ALT: 40 U/L (ref 0–44)
AST: 78 U/L — ABNORMAL HIGH (ref 15–41)
Albumin: 2.9 g/dL — ABNORMAL LOW (ref 3.5–5.0)
Alkaline Phosphatase: 67 U/L (ref 38–126)
Anion gap: 9 (ref 5–15)
BUN: 16 mg/dL (ref 8–23)
CO2: 20 mmol/L — ABNORMAL LOW (ref 22–32)
Calcium: 8 mg/dL — ABNORMAL LOW (ref 8.9–10.3)
Chloride: 107 mmol/L (ref 98–111)
Creatinine, Ser: 1.32 mg/dL — ABNORMAL HIGH (ref 0.61–1.24)
GFR, Estimated: 59 mL/min — ABNORMAL LOW (ref >60)
Glucose, Bld: 101 mg/dL — ABNORMAL HIGH (ref 70–99)
Potassium: 4.6 mmol/L (ref 3.5–5.1)
Sodium: 136 mmol/L (ref 135–145)
Total Bilirubin: 0.9 mg/dL (ref 0.3–1.2)
Total Protein: 6.5 g/dL (ref 6.5–8.1)

## 2021-04-06 LAB — CBC WITH DIFFERENTIAL/PLATELET
Abs Immature Granulocytes: 0.02 10*3/uL (ref 0.00–0.07)
Basophils Absolute: 0 10*3/uL (ref 0.0–0.1)
Basophils Relative: 0 %
Eosinophils Absolute: 0 10*3/uL (ref 0.0–0.5)
Eosinophils Relative: 0 %
HCT: 31.7 % — ABNORMAL LOW (ref 39.0–52.0)
Hemoglobin: 10.6 g/dL — ABNORMAL LOW (ref 13.0–17.0)
Immature Granulocytes: 0 %
Lymphocytes Relative: 21 %
Lymphs Abs: 1.1 10*3/uL (ref 0.7–4.0)
MCH: 28.6 pg (ref 26.0–34.0)
MCHC: 33.4 g/dL (ref 30.0–36.0)
MCV: 85.7 fL (ref 80.0–100.0)
Monocytes Absolute: 1.2 10*3/uL — ABNORMAL HIGH (ref 0.1–1.0)
Monocytes Relative: 22 %
Neutro Abs: 3.1 10*3/uL (ref 1.7–7.7)
Neutrophils Relative %: 57 %
Platelets: 268 10*3/uL (ref 150–400)
RBC: 3.7 MIL/uL — ABNORMAL LOW (ref 4.22–5.81)
RDW: 15.4 % (ref 11.5–15.5)
WBC: 5.5 10*3/uL (ref 4.0–10.5)
nRBC: 0 % (ref 0.0–0.2)

## 2021-04-06 LAB — LACTIC ACID, PLASMA
Lactic Acid, Venous: 1.1 mmol/L (ref 0.5–1.9)
Lactic Acid, Venous: 1.3 mmol/L (ref 0.5–1.9)

## 2021-04-06 LAB — LIPID PANEL
Cholesterol: 156 mg/dL (ref 0–200)
HDL: 42 mg/dL (ref >40)
LDL Cholesterol: 103 mg/dL — ABNORMAL HIGH (ref 0–99)
Total CHOL/HDL Ratio: 3.7 RATIO
Triglycerides: 55 mg/dL (ref <150)
VLDL: 11 mg/dL (ref 0–40)

## 2021-04-06 LAB — MRSA NEXT GEN BY PCR, NASAL: MRSA by PCR Next Gen: NOT DETECTED

## 2021-04-06 LAB — PROCALCITONIN: Procalcitonin: 0.18 ng/mL

## 2021-04-06 LAB — FOLATE: Folate: 14.4 ng/mL (ref >5.9)

## 2021-04-06 LAB — VITAMIN B12: Vitamin B-12: 2355 pg/mL — ABNORMAL HIGH (ref 180–914)

## 2021-04-06 MED ORDER — METRONIDAZOLE 500 MG/100ML IV SOLN
500.0000 mg | Freq: Three times a day (TID) | INTRAVENOUS | Status: DC
  Administered 2021-04-06 – 2021-04-07 (×4): 500 mg via INTRAVENOUS
  Filled 2021-04-06 (×4): qty 100

## 2021-04-06 MED ORDER — CLOPIDOGREL BISULFATE 75 MG PO TABS
75.0000 mg | ORAL_TABLET | Freq: Every day | ORAL | Status: DC
  Administered 2021-04-06: 75 mg via ORAL
  Filled 2021-04-06: qty 1

## 2021-04-06 MED ORDER — REMDESIVIR 100 MG IV SOLR
100.0000 mg | Freq: Every day | INTRAVENOUS | Status: AC
  Administered 2021-04-07 – 2021-04-10 (×4): 100 mg via INTRAVENOUS
  Filled 2021-04-06 (×4): qty 20

## 2021-04-06 MED ORDER — SODIUM CHLORIDE 0.9 % IV SOLN
2.0000 g | Freq: Two times a day (BID) | INTRAVENOUS | Status: DC
  Administered 2021-04-06 – 2021-04-07 (×3): 2 g via INTRAVENOUS
  Filled 2021-04-06 (×3): qty 2

## 2021-04-06 MED ORDER — ENOXAPARIN SODIUM 40 MG/0.4ML IJ SOSY
40.0000 mg | PREFILLED_SYRINGE | INTRAMUSCULAR | Status: DC
  Administered 2021-04-06 – 2021-04-07 (×2): 40 mg via SUBCUTANEOUS
  Filled 2021-04-06 (×2): qty 0.4

## 2021-04-06 MED ORDER — ACETAMINOPHEN 650 MG RE SUPP
650.0000 mg | Freq: Four times a day (QID) | RECTAL | Status: DC | PRN
  Administered 2021-04-06 (×2): 650 mg via RECTAL
  Filled 2021-04-06 (×2): qty 1

## 2021-04-06 MED ORDER — VANCOMYCIN HCL IN DEXTROSE 1-5 GM/200ML-% IV SOLN
1000.0000 mg | INTRAVENOUS | Status: DC
  Administered 2021-04-06: 1000 mg via INTRAVENOUS
  Filled 2021-04-06: qty 200

## 2021-04-06 MED ORDER — SODIUM CHLORIDE 0.9 % IV SOLN
200.0000 mg | Freq: Once | INTRAVENOUS | Status: AC
  Administered 2021-04-06: 200 mg via INTRAVENOUS
  Filled 2021-04-06: qty 40

## 2021-04-06 NOTE — Progress Notes (Signed)
Pharmacy Antibiotic Note  David [name] is a 65 y.o. male admitted on 04/05/2021 with concern for sepsis.  Pharmacy has been consulted for vancomycin and cefepime dosing.  Pt's baseline SCr~1, now 1.75.  Plan: Vancomycin 1250mg  given in ED; will monitor SCr prior to redosing. Cefepime 2g IV Q12H.  Height: 5\' 10"  (177.8 cm) Weight: 68 kg (149 lb 14.6 oz) IBW/kg (Calculated) : 73  Temp (24hrs), Avg:101.6 F (38.7 C), Min:99.9 F (37.7 C), Max:102.9 F (39.4 C)  Recent Labs  Lab 04/05/21 2015 04/05/21 2019 04/05/21 2223  WBC 6.5  --   --   CREATININE 1.75*  --   --   LATICACIDVEN  --  3.9* 3.6*    Estimated Creatinine Clearance: 39.9 mL/min (A) (by C-G formula based on SCr of 1.75 mg/dL (H)).    No Known Allergies   Thank you for allowing pharmacy to be a part of this patient's care.  Wynona Neat, PharmD, BCPS  04/06/2021 1:28 AM

## 2021-04-06 NOTE — ED Notes (Signed)
Full linen change and new condom cath placed

## 2021-04-06 NOTE — Progress Notes (Signed)
Pharmacy Antibiotic Note  David [name] is a 65 y.o. male admitted on 04/05/2021 with COVID and concern for sepsis.  Pharmacy has been consulted for vancomycin and cefepime dosing.  Pt's baseline SCr~1. On admission, SCr was elevated at 1.75 and is now trending down at 1.32.   Plan: Check MRSA PCR Schedule Vancomycin 1000mg  IV every 24 hours.  Continue Cefepime 2g IV Q12H. Continue to monitor SCr and ability to adjust further  Height: 5\' 10"  (177.8 cm) Weight: 68 kg (149 lb 14.6 oz) IBW/kg (Calculated) : 73  Temp (24hrs), Avg:101.6 F (38.7 C), Min:99.9 F (37.7 C), Max:102.9 F (39.4 C)  Recent Labs  Lab 04/05/21 2015 04/05/21 2019 04/05/21 2223 04/06/21 0157 04/06/21 0343 04/06/21 0426  WBC 6.5  --   --   --   --  5.5  CREATININE 1.75*  --   --   --   --  1.32*  LATICACIDVEN  --  3.9* 3.6* 1.1 1.3  --     Estimated Creatinine Clearance: 52.9 mL/min (A) (by C-G formula based on SCr of 1.32 mg/dL (H)).    No Known Allergies   Thank you for allowing pharmacy to be a part of this patient's care.  Sloan Leiter, PharmD, BCPS, BCCCP Clinical Pharmacist Please refer to [facility] for [place] numbers 04/06/2021 6:57 AM

## 2021-04-06 NOTE — ED Notes (Signed)
Attempted report 

## 2021-04-06 NOTE — ED Notes (Signed)
Patient transported to MRI 

## 2021-04-06 NOTE — ED Notes (Signed)
Pt back from MRI 

## 2021-04-06 NOTE — ED Notes (Signed)
Pt still at MRI

## 2021-04-06 NOTE — Evaluation (Addendum)
Physical Therapy Evaluation Patient Details Name: David [name] MRN: [id number] DOB: [birth year] Today's Date: 04/06/2021   History of Present Illness  Pt is a 65 y.o. male who presented 04/05/21 with AMS and pt falling. MRI revealed acute to subacute small vessel infarcts in the left corona  radiata and right frontal white matter. UDS positive for cocaine. Pt with AKI and sepsis 2/2 COVID infection. Patient was recently admitted from 03/13/21-03/15/21 for new CVA. PMH: CVA, falls, hyperlipidemia, hypertension, prediabetes, BPH, substance use disorder.   Clinical Impression  Pt presents with condition above and deficits mentioned below, see PT Problem List. Per chart, pt was most recently utilizing a rollator with acute PT during his most recent admission, but discharged without a rollator due to financial issues 03/15/21. Per chart, pt is homeless, thus suspect he was functioning independently prior to admission. Currently, pt is very lethargic, disoriented to time and situation, and displaying R-sided weakness and apraxia. In addition, he displays balance deficits, coordination deficits, and decreased activity tolerance. He is at high risk for falls. Pt required modA for bed mobility and transfers and maxA to take several lateral steps at EOB today. Suspect pt will function better once he is not so lethargic. Recommend pt follow-up with skilled PT in a SNF setting for short-term rehab to maximize his safety and independence with all functional mobility. Will continue to follow acutely.    Follow Up Recommendations SNF;Supervision/Assistance - 24 hour    Equipment Recommendations  Other (comment) (TBD)    Recommendations for Other Services       Precautions / Restrictions Precautions Precautions: Fall Precaution Comments: COVID+ Restrictions Weight Bearing Restrictions: No      Mobility  Bed Mobility Overal bed mobility: Needs Assistance Bed Mobility: Supine to Sit;Sit to Supine     Supine  to sit: Mod assist;+2 for safety/equipment Sit to supine: Mod assist;+2 for safety/equipment   General bed mobility comments: Physical assistance to initiate legs movement off EOB as pt was lethargic, but then pt showed good initation, modA to sit up. ModA to lift legs back onto bed to return to supine.    Transfers Overall transfer level: Needs assistance Equipment used: 1 person hand held assist Transfers: Sit to/from Stand Sit to Stand: Mod assist;+2 safety/equipment         General transfer comment: Sit to stand from EOB with pt holding onto therapist anterior to him, modA to power up and steady.  Ambulation/Gait Ambulation/Gait assistance: Max assist;+2 safety/equipment Gait Distance (Feet): 3 Feet Assistive device: 1 person hand held assist Gait Pattern/deviations: Decreased stride length;Decreased dorsiflexion - right;Decreased weight shift to right;Trunk flexed;Narrow base of support Gait velocity: reduced Gait velocity interpretation: <1.31 ft/sec, indicative of household ambulator General Gait Details: Pt with difficulty coordinating R foot to step laterally to R, instead stepping anteriorly 1x with knee buckle, maxA to keep pt safe. Narrow stance and trunk flexed, needing cues to correct.  Stairs            Wheelchair Mobility    Modified Rankin (Stroke Patients Only) Modified Rankin (Stroke Patients Only) Pre-Morbid Rankin Score: Slight disability Modified Rankin: Moderately severe disability     Balance Overall balance assessment: Needs assistance Sitting-balance support: Bilateral upper extremity supported;Feet supported Sitting balance-Leahy Scale: Poor Sitting balance - Comments: Relaint on UE support, min guard.   Standing balance support: Bilateral upper extremity supported Standing balance-Leahy Scale: Poor Standing balance comment: Relaint on UE support and mod-maxA.  Pertinent Vitals/Pain Pain  Assessment: Faces Faces Pain Scale: No hurt Pain Intervention(s): Monitored during session    Home Living Family/patient expects to be discharged to:: Shelter/Homeless                 Additional Comments: per chart    Prior Function Level of Independence: Independent         Comments: Pt lethargic, difficult to obtain info this date. Per chart 03/15/21, pt used a rollator with acute PT previous admission but was unable to afford rollator to d/c with.     Hand Dominance        Extremity/Trunk Assessment   Upper Extremity Assessment Upper Extremity Assessment: Defer to OT evaluation    Lower Extremity Assessment Lower Extremity Assessment: RLE deficits/detail;Generalized weakness RLE Deficits / Details: decreased strength noted with functional mobility compared to L; incoordination noted with mobility RLE Coordination: decreased fine motor;decreased gross motor    Cervical / Trunk Assessment Cervical / Trunk Assessment: Normal  Communication   Communication: Other (comment) (soft spoken)  Cognition Arousal/Alertness: Lethargic Behavior During Therapy: Flat affect Overall Cognitive Status: Impaired/Different from baseline Area of Impairment: Orientation;Attention;Following commands;Safety/judgement;Awareness;Problem solving                 Orientation Level: Disoriented to;Time;Situation Current Attention Level: Selective   Following Commands: Follows one step commands inconsistently;Follows one step commands with increased time Safety/Judgement: Decreased awareness of deficits;Decreased awareness of safety Awareness: Intellectual Problem Solving: Slow processing;Decreased initiation;Difficulty sequencing;Requires verbal cues;Requires tactile cues General Comments: Pt identified he was in a hospital but stated year was "2027" and he did not know why he was in the hospital. Pt very lethargic, needing sternal rubs to awaken and keep awake along with physical  assistance to initaite bed mobility. Pt slow to process cues and respond, poor sequencing.      General Comments      Exercises     Assessment/Plan    PT Assessment Patient needs continued PT services  PT Problem List Decreased strength;Decreased range of motion;Decreased activity tolerance;Decreased balance;Decreased mobility;Decreased cognition;Decreased coordination;Decreased knowledge of use of DME;Decreased safety awareness;Pain;Cardiopulmonary status limiting activity       PT Treatment Interventions DME instruction;Gait training;Functional mobility training;Therapeutic activities;Therapeutic exercise;Balance training;Cognitive remediation;Patient/family education;Stair training;Neuromuscular re-education    PT Goals (Current goals can be found in the Care Plan section)  Acute Rehab PT Goals Patient Stated Goal: to urinate PT Goal Formulation: With patient Time For Goal Achievement: 04/20/21 Potential to Achieve Goals: Good    Frequency Min 3X/week   Barriers to discharge        Co-evaluation PT/OT/SLP Co-Evaluation/Treatment: Yes Reason for Co-Treatment: Complexity of the patient's impairments (multi-system involvement);Necessary to address cognition/behavior during functional activity;For patient/therapist safety;To address functional/ADL transfers PT goals addressed during session: Mobility/safety with mobility;Balance         AM-PAC PT "6 Clicks" Mobility  Outcome Measure Help needed turning from your back to your side while in a flat bed without using bedrails?: A Lot Help needed moving from lying on your back to sitting on the side of a flat bed without using bedrails?: A Lot Help needed moving to and from a bed to a chair (including a wheelchair)?: A Lot Help needed standing up from a chair using your arms (e.g., wheelchair or bedside chair)?: A Lot Help needed to walk in hospital room?: A Lot Help needed climbing 3-5 steps with a railing? : Total 6 Click  Score: 11    End of Session Equipment Utilized  During Treatment: Gait belt Activity Tolerance: Patient limited by lethargy Patient left: in bed;with call bell/phone within reach;Other (comment) (with neuro PA in room) Nurse Communication: Mobility status;Need for lift equipment PT Visit Diagnosis: Unsteadiness on feet (R26.81);Repeated falls (R29.6);History of falling (Z91.81);Muscle weakness (generalized) (M62.81);Hemiplegia and hemiparesis;Difficulty in walking, not elsewhere classified (R26.2);Other abnormalities of gait and mobility (R26.89);Other symptoms and signs involving the nervous system (R29.898);Apraxia (R48.2) Hemiplegia - Right/Left: Right Hemiplegia - dominant/non-dominant: Dominant Hemiplegia - caused by: Cerebral infarction    Time: 0034-9611 PT Time Calculation (min) (ACUTE ONLY): 16 min   Charges:   PT Evaluation $PT Eval Moderate Complexity: 1 Mod          Moishe Spice, PT, DPT Acute Rehabilitation Services  Pager: [phone] Office: [phone]   Orvan Falconer 04/06/2021, 1:45 PM

## 2021-04-06 NOTE — Evaluation (Signed)
Occupational Therapy Evaluation Patient Details Name: David [name] MRN: [id number] DOB: [birth year] Today's Date: 04/06/2021    History of Present Illness Pt is a 65 y.o. male who presented 04/05/21 with AMS and pt falling. MRI revealed acute to subacute small vessel infarcts in the left corona  radiata and right frontal white matter. UDS positive for cocaine. Pt with AKI and sepsis 2/2 COVID infection. Patient was recently admitted from 03/13/21-03/15/21 for new CVA. PMH: CVA, falls, hyperlipidemia, hypertension, prediabetes, BPH, substance use disorder.   Clinical Impression   This 65 yo male admitted and found to have above presents to acute OT with PLOF of being on the streets (homeless, prior to last admission and since last admission). He currently is Max-total A for all basic ADLs and +2 Mod A for bed mobility and transfers. He will continue to benefit from acute OT with follow up at SNF.    Follow Up Recommendations  SNF;Supervision/Assistance - 24 hour    Equipment Recommendations  Other (comment) (TBD next venue)           Precautions / Restrictions Precautions Precautions: Fall Precaution Comments: COVID+ Restrictions Weight Bearing Restrictions: No      Mobility Bed Mobility Overal bed mobility: Needs Assistance Bed Mobility: Supine to Sit;Sit to Supine     Supine to sit: Mod assist;+2 for safety/equipment Sit to supine: Mod assist;+2 for safety/equipment   General bed mobility comments: Physical assistance to initiate legs movement off EOB as pt was lethargic, but then pt showed good initation, modA to sit up. ModA to lift legs back onto bed to return to supine.    Transfers Overall transfer level: Needs assistance Equipment used: 1 person hand held assist Transfers: Sit to/from Stand Sit to Stand: Mod assist;+2 safety/equipment         General transfer comment: Sit to stand from EOB with pt holding onto therapist anterior to him, modA to power up and  steady.    Balance Overall balance assessment: Needs assistance Sitting-balance support: Bilateral upper extremity supported;Feet supported Sitting balance-Leahy Scale: Poor Sitting balance - Comments: Relaint on UE support, min guard.   Standing balance support: Bilateral upper extremity supported Standing balance-Leahy Scale: Poor Standing balance comment: Reliant on UE support and mod-maxA.                           ADL either performed or assessed with clinical judgement   ADL Overall ADL's : Needs assistance/impaired Eating/Feeding: Maximal assistance;Bed level Eating/Feeding Details (indicate cue type and reason): Due to lethargy Grooming: Maximal assistance;Sitting Grooming Details (indicate cue type and reason): Edge of stretcher Upper Body Bathing: Maximal assistance;Sitting Upper Body Bathing Details (indicate cue type and reason): Edge of stretcher Lower Body Bathing: Maximal assistance Lower Body Bathing Details (indicate cue type and reason): Mod A +2 sit<>stand Upper Body Dressing : Maximal assistance;Sitting Upper Body Dressing Details (indicate cue type and reason): Edge of stretcher Lower Body Dressing: Total assistance Lower Body Dressing Details (indicate cue type and reason): Mod A +2 sit<>stand Toilet Transfer: Moderate assistance;+2 for physical assistance;Stand-pivot                   Vision   Additional Comments: Need to further assess            Pertinent Vitals/Pain Pain Assessment: Faces Faces Pain Scale: No hurt Pain Intervention(s): Monitored during session     Hand Dominance Left   Extremity/Trunk Assessment Upper Extremity Assessment  Upper Extremity Assessment: Generalized weakness RUE Deficits / Details: decreased strength and coordination compared to LUE RUE Coordination: decreased fine motor;decreased gross motor   Lower Extremity Assessment Lower Extremity Assessment: RLE deficits/detail;Generalized weakness RLE  Deficits / Details: decreased strength noted with functional mobility compared to L; incoordination noted with mobility RLE Coordination: decreased fine motor;decreased gross motor   Cervical / Trunk Assessment Cervical / Trunk Assessment: Normal   Communication Communication Communication: Other (comment) (soft spoken)   Cognition Arousal/Alertness: Lethargic Behavior During Therapy: Flat affect Overall Cognitive Status: Impaired/Different from baseline Area of Impairment: Orientation;Attention;Following commands;Safety/judgement;Awareness;Problem solving                 Orientation Level: Disoriented to;Time;Situation Current Attention Level: Selective   Following Commands: Follows one step commands inconsistently;Follows one step commands with increased time Safety/Judgement: Decreased awareness of deficits;Decreased awareness of safety Awareness: Intellectual Problem Solving: Slow processing;Decreased initiation;Difficulty sequencing;Requires verbal cues;Requires tactile cues General Comments: Pt identified he was in a hospital but stated year was "2027" and he did not know why he was in the hospital. Pt very lethargic, needing sternal rubs to awaken and keep awake along with physical assistance to initaite bed mobility. Pt slow to process cues and respond, poor sequencing.              Home Living Family/patient expects to be discharged to:: Shelter/Homeless                                 Additional Comments: per chart      Prior Functioning/Environment Level of Independence: Independent        Comments: Pt lethargic, difficult to obtain info this date. Per chart 03/15/21, pt used a rollator with acute PT previous admission but was unable to afford rollator to d/c with.        OT Problem List: Decreased range of motion;Decreased strength;Impaired balance (sitting and/or standing);Decreased coordination;Decreased cognition;Decreased safety  awareness;Impaired UE functional use;Impaired vision/perception      OT Treatment/Interventions: Self-care/ADL training;Therapeutic exercise;Therapeutic activities;DME and/or AE instruction;Patient/family education;Balance training;Visual/perceptual remediation/compensation    OT Goals(Current goals can be found in the care plan section) Acute Rehab OT Goals Patient Stated Goal: to urinate OT Goal Formulation: With patient Time For Goal Achievement: 04/20/21 Potential to Achieve Goals: Good  OT Frequency: Min 2X/week   Barriers to D/C: Decreased caregiver support          Co-evaluation PT/OT/SLP Co-Evaluation/Treatment: Yes Reason for Co-Treatment: Complexity of the patient's impairments (multi-system involvement);Necessary to address cognition/behavior during functional activity;For patient/therapist safety;To address functional/ADL transfers PT goals addressed during session: Mobility/safety with mobility;Balance OT goals addressed during session: ADL's and self-care;Strengthening/ROM      AM-PAC OT "6 Clicks" Daily Activity     Outcome Measure Help from another person eating meals?: A Lot Help from another person taking care of personal grooming?: A Lot Help from another person toileting, which includes using toliet, bedpan, or urinal?: A Lot Help from another person bathing (including washing, rinsing, drying)?: A Lot Help from another person to put on and taking off regular upper body clothing?: A Lot Help from another person to put on and taking off regular lower body clothing?: Total 6 Click Score: 11   End of Session Equipment Utilized During Treatment: Gait belt Nurse Communication: Mobility status  Activity Tolerance: Patient limited by lethargy Patient left: in bed;with call bell/phone within reach  OT Visit Diagnosis: Unsteadiness on feet (R26.81);Repeated falls (  R29.6);Muscle weakness (generalized) (M62.81);Other symptoms and signs involving cognitive  function;Hemiplegia and hemiparesis Hemiplegia - Right/Left: Right Hemiplegia - dominant/non-dominant: Non-Dominant Hemiplegia - caused by: Cerebral infarction                Time: 6270-3500 OT Time Calculation (min): 16 min Charges:  OT General Charges $OT Visit: 1 Visit OT Evaluation $OT Eval Moderate Complexity: 1 Mod  [address], OTR/L Acute [employer] Pager [phone] Office [phone]    Almon Register 04/06/2021, 3:37 PM

## 2021-04-06 NOTE — Progress Notes (Signed)
EEG completed, results pending. 

## 2021-04-06 NOTE — TOC Initial Note (Signed)
Transition of Care [facility]) - Initial/Assessment Note    Patient Details  Name: David [name] MRN: [id number] Date of Birth: Jul 30, 1956  [facility] CM/SW Contact:    Verdell Carmine, RN Phone Number: 04/06/2021, 12:14 PM  Clinical Narrative:                  Patient admitted for COVID, had CVA recently. Patient is homeless currently, has sons in the area. Positive for cocaine on admission to ED. Temp 106.6 temporal. In EMS. R?O encephalopathy. Patient will received COVID treatments and MRI. Depending on conative and functional status may need SNF placement. CM will follow for needs  Expected Discharge Plan: [place] Barriers to Discharge: Continued Medical Work up   Patient Goals and CMS Choice        Expected Discharge Plan and Services Expected Discharge Plan: [place] arrangements for the past 2 months: Homeless                                      Prior Living Arrangements/Services Living arrangements for the past 2 months: Homeless Lives with:: Self Patient language and need for interpreter reviewed:: Yes        Need for Family Participation in Patient Care: Yes (Comment) Care giver support system in place?: Yes (comment)   Criminal Activity/Legal Involvement Pertinent to Current Situation/Hospitalization: No - Comment as needed  Activities of Daily Living      Permission Sought/Granted                  Emotional Assessment       Orientation: : Fluctuating Orientation (Suspected and/or reported Sundowners) Alcohol / Substance Use: Illicit Drugs Psych Involvement: No (comment)  Admission diagnosis:  Altered mental status [R41.82] Patient Active Problem List   Diagnosis Date Noted   Sepsis due to COVID-19 [facility])    Acute kidney insufficiency    Altered mental status 04/05/2021   Cerebrovascular accident (CVA) ([place]) 03/13/2021   Concussion with no loss of consciousness     Laceration of forehead    Strain of right pectoralis muscle 03/14/2020   Bilateral leg weakness 03/14/2020   Numbness of right thumb 10/26/2018   Health care maintenance 10/26/2018   History of stroke 04/29/2018   Hearing impaired 04/29/2018   Erectile dysfunction 09/01/2017   Mild tetrahydrocannabinol (THC) abuse    Slurred speech 08/03/2017   Left hip pain 11/06/2016   Prediabetes 11/06/2016   Labral tear of shoulder 01/25/2014   Adhesive capsulitis of left shoulder 12/05/2013   Trigger point of neck 08/16/2012   Hyperlipidemia 08/08/2012   Elevated LFTs 08/08/2012   Cocaine abuse ([place]) 08/08/2012   Chronic pain 06/01/2012   BPH (benign prostatic hyperplasia) 09/09/2011   ROTATOR CUFF TEAR 12/03/2008   PCP:  Kinnie Feil, MD Pharmacy:   [id number] [place], [place] [address] [place] [address] Phone: [phone] Fax: [phone]  [facility] [address] [place] [address] Phone: [phone] Fax: [phone]     Social Determinants of Health (SDOH) Interventions    Readmission Risk Interventions No flowsheet data found.

## 2021-04-06 NOTE — Progress Notes (Signed)
Family Medicine Teaching Service Daily Progress Note Intern Pager: [phone]  Patient name: David [name] Medical record number: [id number] Date of birth: 08-Aug-1956 Age: 65 y.o. Gender: male  Primary Care Provider: Kinnie Feil, MD Consultants: Neurology Code Status: FULL  Pt Overview and Major Events to Date:  8/6: Admitted  Assessment and Plan: David [name] is a 65 year old male who presented with altered mental status, found to be COVID-positive and now with acute to subacute frontal CVA.  Past medical history significant for CVA (July 2022), falls, hyperlipidemia, hypertension, prediabetes, BPH, substance use disorder.  Acute to Subacute CVA MRI brain notable for acute to subacute small vessel infarcts in the left corona radiata and right frontal white matter.  Neurology consulted and reviewed imaging, these appear to be new strokes but would not explain his altered mental status.  Recommending continuing stroke work-up. MRA Head/Neck slightly motion degraded but with no emergent finding or convincing change from a CTA 3 weeks ago. Lipid panel with elevated LDL at 103. -Neurology consulted, appreciate care and recommendations -DAPT with aspirin and Plavix x21 days followed by aspirin alone -Cardiac monitoring -Neurochecks -Continue atorvastatin 80 mg daily -Allow for permissive hypertension  Sepsis, AMS  COVID-19 Infection Continues to be febrile, T-max 103.1 currently.  Also tachycardic, tachypneic and hypertensive.  He is without oxygen requirement and saturating well on room air. WBC WNL at 5.5. Lactic acid now WNL, s/p IVF. Still altered this morning, responsive to painful stimuli. Pupils are pinpoint b/l. Able to squeeze fingers but would not follow any other commands. Question if patient took synthetic opioid that would not show up on UDS. -Hold Decadron given no oxygen requirement -Continue Remdesivir  -Airborne and contact precautions -Follow-up blood and urine  cultures -Continue cefepime, Flagyl, vancomycin -F/u MRSA swab -Can de-escalate/discontinue antibiotics pending procalcitonin/cultures -Tylenol every 6 hours as needed for fever -COWs   Abdominal Pain: Acute  Upon palpation of abdomen during examination patient in obvious distress, awoke and was holding abdomen, tachypnic and grimicing. Unable to describe the pain to me, appeared to be in lower abdomen.  -CT Abd/pelvis  -Monitor pain  AKI Creatinine 1.75 on admission, now improved to 1.32.  Baseline is 1-1.2. -Continue LR 150 cc/hr -Monitor with BMP  HTN BP's ranging 140-170s/80-110s. Does not appear to be on any home antihypertensives. -Vitals per floor protocol -Allow for permissive hypertension  Hyperlipidemia Home medication includes atorvastatin 80 mg. -Follow-up lipid panel -Continue statin  Normocytic Anemia Hgb 12.6>10.6, likely dilutional as patient is s/p 2.25L IVF.  -Monitor for signs or symptoms of bleeding -Monitor with CBC, transfusion threshold 7 -Due for outpatient screening colonoscopy  Cocaine Abuse Could contribute to AMS but would not explain fevers. UDS positive for cocaine.  B12 like 2000 -Continue to encourage cessation  Elevated B-12 Elevated at 2355. Unknown if patient takes supplements though unlikely.  Could be elevated in the setting of infection/inflammation. Other considerations for elevated B12 levels include hematologic disorders such as CML, PML, polycythemia vera, liver dysfunction/disease including hepatitis, cirrhosis, HCC. Can also be seen in MGUS, renal failure and auto-immune disorders. CBC is notable for anemia, otherwise unremarkable. AST slightly elevated to 78, ALT WNO at 40. Alk phos is normal at 67.  -Monitor, continue sepsis workup above  BPH: Chronic, stable -Continue home Tamsulosin 0.4 mg daily  Homelessness -TOC consult for resources  FEN/GI: NPO until passes bedside swallow PPx: Lovenox Dispo:Pending PT  recommendations  pending clinical improvement . Barriers include continued medical workup.   Subjective:  Patient is somnolent but responsive to painful stimuli. Unable to talk, follows commands intermittently.   Objective: Temp:  [99.9 F (37.7 C)-102.9 F (39.4 C)] 100.7 F (38.2 C) (08/07 0730) Pulse Rate:  [94-124] 102 (08/07 0730) Resp:  [19-42] 32 (08/07 0730) BP: (147-177)/(80-115) 174/103 (08/07 0730) SpO2:  [94 %-100 %] 98 % (08/07 0730) Weight:  [68 kg] 68 kg (08/06 2012) Physical Exam: General: Somnolent, arousable to painful stimuli, follows commands intermittently, disheveled  Cardiovascular: RRR, no murmur  Respiratory: CTAB anteriorly, unable to auscultate posteriorly Abdomen: soft, in distress with palpation but without rebound/guarding Extremities: No edema, warm, dry Neuro: Awakens to painful stimuli, nonverbal, able to squeeze fingers but otherwise does not follow any commands, altered.  Pupils are pinpoint bilaterally.   Laboratory: Recent Labs  Lab 04/05/21 2015 04/06/21 0426  WBC 6.5 5.5  HGB 12.6* 10.6*  HCT 38.5* 31.7*  PLT 262 268   Recent Labs  Lab 04/05/21 2015 04/06/21 0426  NA 140 136  K 4.9 4.6  CL 108 107  CO2 22 20*  BUN 28* 16  CREATININE 1.75* 1.32*  CALCIUM 8.9 8.0*  PROT 8.1 6.5  BILITOT 0.8 0.9  ALKPHOS 85 67  ALT 43 40  AST 69* 78*  GLUCOSE 166* 101*    Imaging/Diagnostic Tests: CT HEAD WO CONTRAST (5MM)  Result Date: 04/05/2021 CLINICAL DATA:  Recent fall with altered mental status and fevers EXAM: CT HEAD WITHOUT CONTRAST TECHNIQUE: Contiguous axial images were obtained from the base of the skull through the vertex without intravenous contrast. COMPARISON:  03/13/2021 FINDINGS: Brain: No evidence of acute infarction, hemorrhage, hydrocephalus, extra-axial collection or mass lesion/mass effect. Chronic white matter ischemic changes are noted. Additionally basal ganglia infarcts are seen bilaterally stable from the prior  exam. Lacunar infarct is also noted within the left pons also stable from the prior exam. Vascular: No hyperdense vessel or unexpected calcification. Skull: Normal. Negative for fracture or focal lesion. Sinuses/Orbits: No acute finding. Other: Left scalp lipoma is noted and stable. IMPRESSION: No acute abnormality noted. Chronic ischemic changes are noted. Electronically Signed   By: Inez Catalina M.D.   On: 04/05/2021 23:13   MR BRAIN WO CONTRAST  Result Date: 04/06/2021 CLINICAL DATA:  High fever and COVID positive. Neuro deficit with acute stroke suspected EXAM: MRI HEAD WITHOUT CONTRAST TECHNIQUE: Multiplanar, multiecho pulse sequences of the brain and surrounding structures were obtained without intravenous contrast. COMPARISON:  03/13/2021 FINDINGS: Brain: Fading of the small vessel infarction in the left corona radiata seen on prior, posterior to a chronic perforator infarct with volume loss. There is interval restricted diffusion anterior at the left basal ganglia/corona radiata. There is also a new subcortical white matter infarct with weakly restricted diffusion in the high and parasagittal right frontal lobe. Extensive chronic small vessel ischemia with chronic lacunar/perforator infarcts in the deep gray and deep white matter tracks with wallerian degeneration seen into the left brainstem. No acute hemorrhage, hydrocephalus, or collection. Vascular: Preserved flow voids. Skull and upper cervical spine: Normal marrow signal Sinuses/Orbits: Negative IMPRESSION: 1. Acute to subacute small vessel infarcts in the left corona radiata and right frontal white matter. 2. Background of advanced chronic small vessel disease. Electronically Signed   By: Monte Fantasia M.D.   On: 04/06/2021 06:53   DG Chest Port 1 View  Result Date: 04/05/2021 CLINICAL DATA:  Questionable sepsis. EXAM: PORTABLE CHEST 1 VIEW COMPARISON:  March 12, 2020 FINDINGS: The heart size and mediastinal contours are within normal limits.  Both lungs are clear. The visualized skeletal structures are unremarkable. IMPRESSION: No active disease. Electronically Signed   By: Dahlia Bailiff MD   On: 04/05/2021 20:39     Sharion Settler, DO 04/06/2021, 7:51 AM PGY-2, [place] Intern pager: [phone], text pages welcome

## 2021-04-06 NOTE — ED Notes (Signed)
Patient transported to CT 

## 2021-04-06 NOTE — Procedures (Signed)
Routine EEG Report  David [name] is a 65 y.o. male with a history of frontal CVA and encephalopathy who is undergoing an EEG to evaluate for seizures.  Report: This EEG was acquired with electrodes placed according to the International 10-20 electrode system (including Fp1, Fp2, F3, F4, C3, C4, P3, P4, O1, O2, T3, T4, T5, T6, A1, A2, Fz, Cz, Pz). The following electrodes were missing or displaced: none.  The occipital dominant rhythm was 7-8 Hz. This activity is reactive to stimulation. Drowsiness was manifested by background fragmentation; deeper stages of sleep were manifested by K complexes and sleep spindles. There was no focal slowing. There were no interictal epileptiform discharges. There were no electrographic seizures identified. Photic stimulation and hyperventilation were not performed.  Impression and clinical correlation: This EEG was obtained while awake and asleep and is abnormal due to mild diffuse slowing, indicative of global cerebral dysfunction.  Su Monks, MD [facility] [phone]  If 7pm- 7am, please page neurology on call as listed in [place].

## 2021-04-06 NOTE — Consult Note (Addendum)
Neurology Consultation Reason for Consult: new stroke Requesting Physician: Gwendlyn Deutscher  CC: altered mental status/ new stroke at   History is obtained from:patient's chart.   HPI: CARTRELL BENTSEN is a 65 y.o.  left handed black male  with current cocaine use, hypertension, dyslipidemia, and recent (02/2021) admission for left mca/pca territory strokes (discharged on aspirin and plavix but per pt report he never took them), who  presented to the hospital for altered mental status. Patient was seen falling by a bystander and EMS was called. On approach by EMS he was confused and had strong smell of urine. He was brought for further evaluation and management.  In the ED he was drowsy and difficult to arouse, but his answers were correct when he did speak. Family was called by ED and his son reports that patient is homeless.   CT head negative for any acute abnormalities. UDS positive for cocaine.CBC with no leukocytosis.  Mild anemia with hemoglobin 10.6.  CMP significant for AKI with creatinine 1.75 and BUN at 28.  COVID positive.  acid elevated at 3.6.      Chest x-ray negative for any acute abnormalities.  No evidence of pneumonia.  UA significant for small hematuria.  No signs of infection.  MRI brain shows acute to subacute stroke at left corona radiata and right frontal white matter.   In addition to stroke last month, he also suffered a stroke in 12/18  at left putamen, corona radiata, and caudate. Etiology at that time was thought to be a vasospasm secondary to cocaine abuse. His remaining symptom was dysarthria.   Given his new strokes, neurology was called to evaluate and manage, and we thank the ED team for their kind referral.   LKW: unclear     ROS: All other review of systems was negative except as noted in the HPI.   Unable to obtain due to altered mental status.   Past Medical History:  Diagnosis Date   AKI (acute kidney injury) ([place]) 09/01/2017   Benign prostatic hyperplasia     Cerebral infarction ([place]) 08/04/2017      History reviewed. No pertinent family history.    Social History:  reports that he has never smoked. He has never used smokeless tobacco. He reports that he does not drink alcohol and does not use drugs.   Results for NATHIN, SARAN (MRN [id number]) as of 04/06/2021 11:47  Ref. Range 04/05/2021 22:37 04/05/2021 22:38  Alcohol, Ethyl (B) Latest Ref Range: <10 mg/dL  <10  Amphetamines Latest Ref Range: NONE DETECTED  NONE DETECTED   Barbiturates Latest Ref Range: NONE DETECTED  NONE DETECTED   Benzodiazepines Latest Ref Range: NONE DETECTED  NONE DETECTED   Opiates Latest Ref Range: NONE DETECTED  NONE DETECTED   COCAINE Latest Ref Range: NONE DETECTED  POSITIVE (A)   Tetrahydrocannabinol Latest Ref Range: NONE DETECTED  NONE DETECTED    Per son report pt is homeless  Exam: Current vital signs: BP (!) 160/94   Pulse 92   Temp (!) 100.5 F (38.1 C) (Oral)   Resp (!) 27   Ht 5\' 10"  (1.778 m)   Wt 68 kg   SpO2 96%   BMI 21.51 kg/m  Vital signs in last 24 hours: Temp:  [99.9 F (37.7 C)-103.1 F (39.5 C)] 100.5 F (38.1 C) (08/07 1044) Pulse Rate:  [92-124] 92 (08/07 1045) Resp:  [19-42] 27 (08/07 1045) BP: (146-178)/(80-115) 160/94 (08/07 1045) SpO2:  [94 %-100 %] 96 % (08/07 1045)  Weight:  [68 kg] 68 kg (08/06 2012)   Physical Exam  Constitutional: Appears thin and fragile. Hair [place] unkempt. Psych: Affect appropriate to situation Eyes: No scleral injection HENT: No oropharyngeal obstruction.  MSK: no joint deformities.  Cardiovascular: Normal rate and regular rhythm.  Respiratory: Effort normal, non-labored breathing GI: Soft.  No distension. There is no tenderness.  Skin: Warm dry and intact visible skin  Neuro: Mental Status: Patient is awake, alert, oriented to person, place, month, year. He is unclear as to why he is in the hospital Patient is  not able to give a clear and coherent history.  No signs of aphasia or neglect.  He speaks at a whisper and complete assessment of speech is difficult at this time.  Cranial Nerves: II: Visual Fields are full. Pupils are equal, round, and reactive to light.    III,IV, VI: EOMI without ptosis or diploplia.  V: Facial sensation is symmetric to temperature VII: Facial movement is symmetric.  VIII: hearing is intact to voice X: Uvula elevates symmetrically XI: Shoulder shrug is weakly symmetric. XII: tongue is midline without atrophy or fasciculations.  Motor: Tone is normal. Bulk is decreased throughout. 4/5 strength was present in all four extremities.  There is a right pronator drift and the right upper extremity is held in slight flexion at the wrist typical of poststroke weakness. Both lower extremities also drift downward but never touch the bed on 5 count.  Sensory: Sensation is symmetric to light touch and temperature in the arms and legs.  Deep Tendon Reflexes: 2+ and symmetric in the biceps and patellae.   Plantars: Toes are downgoing bilaterally.   Cerebellar: FNF intact bilaterally       I have reviewed labs in epic and the results pertinent to this consultation are:  Results for JOJO, PEHL (MRN [id number]) as of 04/06/2021 11:47  Ref. Range 04/05/2021 22:37 04/05/2021 22:38  Alcohol, Ethyl (B) Latest Ref Range: <10 mg/dL  <10  Amphetamines Latest Ref Range: NONE DETECTED  NONE DETECTED   Barbiturates Latest Ref Range: NONE DETECTED  NONE DETECTED   Benzodiazepines Latest Ref Range: NONE DETECTED  NONE DETECTED   Opiates Latest Ref Range: NONE DETECTED  NONE DETECTED   COCAINE Latest Ref Range: NONE DETECTED  POSITIVE (A)   Tetrahydrocannabinol Latest Ref Range: NONE DETECTED  NONE DETECTED    Results for VADEN, BECHERER (MRN [id number]) as of 04/06/2021 11:47  Ref. Range 04/06/2021 04:26  WBC Latest Ref Range: 4.0 - 10.5 K/uL 5.5  RBC Latest Ref Range: 4.22 - 5.81 MIL/uL 3.70 (L)  Hemoglobin Latest Ref Range: 13.0 - 17.0 g/dL 10.6 (L)  HCT Latest Ref  Range: 39.0 - 52.0 % 31.7 (L)  MCV Latest Ref Range: 80.0 - 100.0 fL 85.7  MCH Latest Ref Range: 26.0 - 34.0 pg 28.6  MCHC Latest Ref Range: 30.0 - 36.0 g/dL 33.4  RDW Latest Ref Range: 11.5 - 15.5 % 15.4  Platelets Latest Ref Range: 150 - 400 K/uL 268  Results for HAADI, SANTELLAN (MRN [id number]) as of 04/06/2021 11:47  Ref. Range 04/06/2021 04:26  Vitamin B12 Latest Ref Range: 180 - 914 pg/mL 2,355 (H)  Results for EMONTE, DIEUJUSTE (MRN [id number]) as of 04/06/2021 11:47  Ref. Range 03/14/2021 04:24  Hemoglobin A1C Latest Ref Range: 4.8 - 5.6 % 6.4 (H)  Results for EMRAN, MOLZAHN (MRN [id number]) as of 04/06/2021 11:47  Ref. Range 03/13/2021 11:52  TSH Latest Ref Range: 0.350 - 4.500 uIU/mL  1.928  Results for RUSSELL, QUINNEY (MRN [id number]) as of 04/06/2021 11:47  Ref. Range 04/06/2021 08:00  Total CHOL/HDL Ratio Latest Units: RATIO 3.7  Cholesterol Latest Ref Range: 0 - 200 mg/dL 156  HDL Cholesterol Latest Ref Range: >40 mg/dL 42  LDL (calc) Latest Ref Range: 0 - 99 mg/dL 103 (H)  Triglycerides Latest Ref Range: <150 mg/dL 55  VLDL Latest Ref Range: 0 - 40 mg/dL 11   Results for VIRGIE, CHERY (MRN [id number]) as of 04/06/2021 11:47  Ref. Range 03/13/2021 11:41 04/05/2021 20:26  RESP PANEL BY RT-PCR (FLU A&B, COVID) ARPGX2 Unknown  Rpt (A)  Influenza A By PCR Latest Ref Range: NEGATIVE   NEGATIVE  Influenza B By PCR Latest Ref Range: NEGATIVE   NEGATIVE  SARS Coronavirus 2 by RT PCR Latest Ref Range: NEGATIVE   POSITIVE (A)  HIV Screen 4th Generation wRfx Latest Ref Range: Non Reactive  Non Reactive    I have reviewed the images obtained:  CT head: 04/05/21  IMPRESSION: No acute abnormality noted.  MRI Brain 04/06/2021 IMPRESSION: 1. Acute to subacute small vessel infarcts in the left corona radiata and right frontal white matter. 2. Background of advanced chronic small vessel disease.  CTA head and neck 03/13/21 IMPRESSION: CTA Head:   1. No large vessel occlusion. 2. Moderate left A2 ACA  stenosis, possibly multifocal. 3. Motion limited evaluation of basilar artery with suspected moderate stenosis. 4. Small vertebrobasilar system with bilateral prominent posterior communicating arteries and small P1 PCAs, anatomic variant.   CTA Neck:   1. No significant (greater than 50%) stenosis. 2. Mild-to-moderate bilateral carotid bifurcation atherosclerosis. 3. Emphysema.  ECHOCARDIOGRAM 03/13/21 Ef 65-70%. No LVH  LA: normal size  IAS/Shunts: No atrial level shunt detected by color flow Doppler.    Assessment: 65 year old male with history of stroke in 2018, as well as scattered small areas of acute strokes in 02/2021, with nonadherence to medical therapy as well as cocaine abuse, admitted into hospital for new left corona radiata and right frontal white matter stroke.  Impression - Left corona radiata and right frontal white matter stroke. - stroke in 2018: left putamen, corona radiata, and caudate.  - Current cocaine use - COVID infection  - nonadherence with medical regimen.   Recommendations: - ADMIT BY MEDICINE  -Statin maximum dose -Frequent neuro checks - encephalopathy workup: folate, rpr, ammonia, EEG in addition to labs above.  -Aspirin 81mg  daily -Risk factor modification -Cardiac telemetry monitoring for arrhythmia -PT/PT/ST consult -Stroke education  -SW for drug abuse counseling - Recommend 30-day event monitor at discharge to assess for arrhythmia which may reveal embolic source for stroke.  Paris Lore, NP   I have seen the patient reviewed the above note.  I suspect that his altered mental status is multifactorial including drugs of abuse, COVID encephalopathy, multifocal infarcts.  He will need secondary risk factor modification, the largest of which would be his cocaine cessation.  Stroke team to follow.  Roland Rack, MD [facility] [phone]  If 7pm- 7am, please page neurology on call as listed in [place].

## 2021-04-07 ENCOUNTER — Encounter (HOSPITAL_COMMUNITY): Payer: Medicare Other

## 2021-04-07 DIAGNOSIS — I6330 Cerebral infarction due to thrombosis of unspecified cerebral artery: Secondary | ICD-10-CM | POA: Diagnosis present

## 2021-04-07 DIAGNOSIS — Z8673 Personal history of transient ischemic attack (TIA), and cerebral infarction without residual deficits: Secondary | ICD-10-CM

## 2021-04-07 DIAGNOSIS — R4182 Altered mental status, unspecified: Secondary | ICD-10-CM | POA: Diagnosis not present

## 2021-04-07 DIAGNOSIS — R509 Fever, unspecified: Secondary | ICD-10-CM

## 2021-04-07 DIAGNOSIS — F1410 Cocaine abuse, uncomplicated: Secondary | ICD-10-CM | POA: Diagnosis not present

## 2021-04-07 LAB — CBC WITH DIFFERENTIAL/PLATELET
Abs Immature Granulocytes: 0.01 10*3/uL (ref 0.00–0.07)
Basophils Absolute: 0 10*3/uL (ref 0.0–0.1)
Basophils Relative: 1 %
Eosinophils Absolute: 0 10*3/uL (ref 0.0–0.5)
Eosinophils Relative: 0 %
HCT: 37 % — ABNORMAL LOW (ref 39.0–52.0)
Hemoglobin: 12.4 g/dL — ABNORMAL LOW (ref 13.0–17.0)
Immature Granulocytes: 0 %
Lymphocytes Relative: 42 %
Lymphs Abs: 2.1 10*3/uL (ref 0.7–4.0)
MCH: 28 pg (ref 26.0–34.0)
MCHC: 33.5 g/dL (ref 30.0–36.0)
MCV: 83.5 fL (ref 80.0–100.0)
Monocytes Absolute: 1 10*3/uL (ref 0.1–1.0)
Monocytes Relative: 20 %
Neutro Abs: 1.8 10*3/uL (ref 1.7–7.7)
Neutrophils Relative %: 37 %
Platelets: 199 10*3/uL (ref 150–400)
RBC: 4.43 MIL/uL (ref 4.22–5.81)
RDW: 14.8 % (ref 11.5–15.5)
WBC: 4.9 10*3/uL (ref 4.0–10.5)
nRBC: 0 % (ref 0.0–0.2)

## 2021-04-07 LAB — RPR: RPR Ser Ql: NONREACTIVE

## 2021-04-07 LAB — HEPATITIS PANEL, ACUTE
HCV Ab: NONREACTIVE
Hep A IgM: NONREACTIVE
Hep B C IgM: NONREACTIVE
Hepatitis B Surface Ag: NONREACTIVE

## 2021-04-07 LAB — URINE CULTURE: Culture: NO GROWTH

## 2021-04-07 LAB — COMPREHENSIVE METABOLIC PANEL
ALT: 51 U/L — ABNORMAL HIGH (ref 0–44)
AST: 105 U/L — ABNORMAL HIGH (ref 15–41)
Albumin: 3.1 g/dL — ABNORMAL LOW (ref 3.5–5.0)
Alkaline Phosphatase: 68 U/L (ref 38–126)
Anion gap: 11 (ref 5–15)
BUN: 12 mg/dL (ref 8–23)
CO2: 22 mmol/L (ref 22–32)
Calcium: 8.2 mg/dL — ABNORMAL LOW (ref 8.9–10.3)
Chloride: 99 mmol/L (ref 98–111)
Creatinine, Ser: 1.23 mg/dL (ref 0.61–1.24)
GFR, Estimated: >60 mL/min (ref >60)
Glucose, Bld: 95 mg/dL (ref 70–99)
Potassium: 3.9 mmol/L (ref 3.5–5.1)
Sodium: 132 mmol/L — ABNORMAL LOW (ref 135–145)
Total Bilirubin: 0.8 mg/dL (ref 0.3–1.2)
Total Protein: 7.4 g/dL (ref 6.5–8.1)

## 2021-04-07 LAB — AMMONIA: Ammonia: 30 umol/L (ref 9–35)

## 2021-04-07 LAB — PROCALCITONIN: Procalcitonin: 0.19 ng/mL

## 2021-04-07 LAB — HEMOGLOBIN A1C
Hgb A1c MFr Bld: 6.3 % — ABNORMAL HIGH (ref 4.8–5.6)
Mean Plasma Glucose: 134.11 mg/dL

## 2021-04-07 MED ORDER — DEXTROSE 5 % IV SOLN
10.0000 mg/kg | Freq: Three times a day (TID) | INTRAVENOUS | Status: DC
  Administered 2021-04-07 – 2021-04-10 (×10): 680 mg via INTRAVENOUS
  Filled 2021-04-07 (×12): qty 13.6

## 2021-04-07 MED ORDER — AMLODIPINE BESYLATE 5 MG PO TABS
5.0000 mg | ORAL_TABLET | Freq: Every day | ORAL | Status: DC
  Administered 2021-04-07 – 2021-04-25 (×19): 5 mg via ORAL
  Filled 2021-04-07 (×19): qty 1

## 2021-04-07 MED ORDER — SODIUM CHLORIDE 0.9 % IV SOLN: INTRAVENOUS | Status: DC

## 2021-04-07 MED ORDER — SODIUM CHLORIDE 0.9 % IV SOLN
2.0000 g | Freq: Two times a day (BID) | INTRAVENOUS | Status: DC
  Administered 2021-04-07 – 2021-04-09 (×5): 2 g via INTRAVENOUS
  Filled 2021-04-07: qty 2
  Filled 2021-04-07 (×5): qty 20

## 2021-04-07 MED ORDER — VANCOMYCIN HCL IN DEXTROSE 1-5 GM/200ML-% IV SOLN
1000.0000 mg | INTRAVENOUS | Status: DC
  Administered 2021-04-07 – 2021-04-08 (×2): 1000 mg via INTRAVENOUS
  Filled 2021-04-07 (×3): qty 200

## 2021-04-07 MED ORDER — SODIUM CHLORIDE 0.9 % IV SOLN
2.0000 g | INTRAVENOUS | Status: DC
  Administered 2021-04-07 – 2021-04-09 (×11): 2 g via INTRAVENOUS
  Filled 2021-04-07 (×4): qty 2000
  Filled 2021-04-07: qty 2
  Filled 2021-04-07 (×3): qty 2000
  Filled 2021-04-07 (×2): qty 2
  Filled 2021-04-07 (×5): qty 2000

## 2021-04-07 MED ORDER — ACETAMINOPHEN 325 MG PO TABS
650.0000 mg | ORAL_TABLET | Freq: Four times a day (QID) | ORAL | Status: DC | PRN
  Administered 2021-04-07: 650 mg via ORAL
  Filled 2021-04-07: qty 2

## 2021-04-07 NOTE — Progress Notes (Signed)
FPTS Interim Night Progress Note  S:Patient sleeping comfortably.  Rounded with primary night RN.  No concerns voiced.  No orders required.    O: Today's Vitals   04/06/21 1900 04/06/21 1929 04/06/21 2100 04/07/21 0035  BP: (!) 166/100 (!) 148/98  (!) 159/111  Pulse: (!) 104 (!) 102  88  Resp: (!) 37 (!) 26 (!) 26 17  Temp:  (!) 101.1 F (38.4 C) 100.2 F (37.9 C)   TempSrc:  Oral Oral   SpO2: 96% 96%  96%  Weight:      Height:      PainSc:          A/P: Continue current management  Carollee Leitz MD PGY-3, [place] Medicine Service pager [phone]

## 2021-04-07 NOTE — Progress Notes (Addendum)
STROKE TEAM PROGRESS NOTE   ATTENDING NOTE: I reviewed above note and agree with the assessment and plan. Pt was seen and examined.   65 year old male with history of cocaine abuse, hypertension, hyperlipidemia, stroke presented to ED for altered mental status, falling and confusion.  He had a stroke in 07/2017 with left BG/CR acute infarct, old left pontine and right caudate infarct.  MRA and carotid Doppler negative.  A1c 6.2, LDL 114, UDS positive for cocaine and THC, EF 60 to 65%.  Discharged on aspirin and Lipitor, with residual mild left-sided weakness.  In 02/2021 he was admitted again for acute stroke at left CR and left MCA/ACA border zone.  CTA head and neck left A2 stenosis, possible BA moderate stenosis.  EF 55 to 70%, LDL 133, A1c 6.4.  UDS negative at the time.  Discharged with DAPT and Lipitor 40.  On this admission, patient again UDS positive for cocaine.  MRI showed left CR and right frontal white matter small infarcts.  EEG slowing background, no seizure.  MRI no changes.  UA negative.  Creatinine 1.23, AST/ALT 105/51.  However, patient found to have fever temperature 101.5 and COVID-positive.  Blood culture pending.  On exam, patient lethargic, but awake alert, eyes open, orientated to place, age and year, however not to months (consideration on the age).  No aphasia, however paucity of speech, able to follow some commands, able to name and repeat.  Mild to moderate dysarthria.  Neck supple, no nuchal rigidity.  Visual fields full, no gaze palsy, facial symmetrical.  Bilateral upper and lower extremity equal strengths, sensation symmetrical, finger-to-nose intact bilaterally.  Patient stroke likely again due to synchronized small vessel disease in the setting of recurrent cocaine use and other uncontrolled risk factors.  However patient mental status change with fever could be due to COVID encephalopathy versus meningitis/encephalitis.  Neuro exam not highly suspect for CNS infection  but will do LP to rule out.  Not able to do LP today due to Plavix dose last night and Lovenox dose this morning.  Discussed with ID, will put on acyclovir, ampicillin, Rocephin and vancomycin for neuro coverage.  Plan for LP tomorrow.  Continue COVID management per primary team.  Okay to continue aspirin 81 and Lipitor 80, but hold off Plavix and Lovenox prophylactic dose at this time.  Discussed with primary team.  For detailed assessment and plan, please refer to above as I have made changes wherever appropriate.   I discussed with Dr. West Bali and primary team. I spent  35 minutes in total face-to-face time with the patient, more than 50% of which was spent in counseling and coordination of care, reviewing test results, images and medication, and discussing the diagnosis, treatment plan and potential prognosis. This patient's care requiresreview of multiple databases, neurological assessment, discussion with family, other specialists and medical decision making of high complexity.  Rosalin Hawking, MD PhD Stroke Neurology 04/07/2021 7:00 PM    INTERVAL HISTORY No family at bedside. Patient is hot to touch. Has been febrile today. NAD. Not on oxygen saturations 96% on RA. Covid precautions in place.  Vitals:   04/07/21 0035 04/07/21 0400 04/07/21 0700 04/07/21 1115  BP: (!) 159/111 (!) 164/92 93/69 (!) 157/109  Pulse: 88 (!) 102 (!) 115 94  Resp: 17 (!) 21 (!) 23 (!) 23  Temp:  99 F (37.2 C)  (!) 101.1 F (38.4 C)  TempSrc:  Oral  Oral  SpO2: 96% 97% 98% 99%  Weight:  Height:       CBC:  Recent Labs  Lab 04/06/21 0426 04/07/21 0344  WBC 5.5 4.9  NEUTROABS 3.1 1.8  HGB 10.6* 12.4*  HCT 31.7* 37.0*  MCV 85.7 83.5  PLT 268 203   Basic Metabolic Panel:  Recent Labs  Lab 04/06/21 0426 04/07/21 0344  NA 136 132*  K 4.6 3.9  CL 107 99  CO2 20* 22  GLUCOSE 101* 95  BUN 16 12  CREATININE 1.32* 1.23  CALCIUM 8.0* 8.2*   Lipid Panel:  Recent Labs  Lab 04/06/21 0800   CHOL 156  TRIG 55  HDL 42  CHOLHDL 3.7  VLDL 11  LDLCALC 103*   HgbA1c:  on 03/14/21: 6.4 Urine Drug Screen:  Recent Labs  Lab 04/05/21 2237  LABOPIA NONE DETECTED  COCAINSCRNUR POSITIVE*  LABBENZ NONE DETECTED  AMPHETMU NONE DETECTED  THCU NONE DETECTED  LABBARB NONE DETECTED    Alcohol Level  Recent Labs  Lab 04/05/21 2238  ETH <10    IMAGING past 24 hours CT ABDOMEN PELVIS WO CONTRAST  Result Date: 04/06/2021 CLINICAL DATA:  Abdominal pain, AKI, sepsis EXAM: CT ABDOMEN AND PELVIS WITHOUT CONTRAST TECHNIQUE: Multidetector CT imaging of the abdomen and pelvis was performed following the standard protocol without IV contrast. COMPARISON:  None. FINDINGS: Lower chest: Trace bilateral pleural effusions and associated atelectasis or consolidation. Hepatobiliary: No solid liver abnormality is seen. No gallstones, gallbladder wall thickening, or biliary dilatation. Pancreas: Unremarkable. No pancreatic ductal dilatation or surrounding inflammatory changes. Spleen: Normal in size without significant abnormality. Adrenals/Urinary Tract: Adrenal glands are unremarkable. Kidneys are normal, without renal calculi, solid lesion, or hydronephrosis. Urinary bladder wall thickening and adjacent fat stranding. Stomach/Bowel: Stomach is within normal limits. Appendix appears normal. The rectum is mildly thickened, featureless, and fluid-filled (series 3, image 70, 64, series 7, image 55). Descending diverticula. Vascular/Lymphatic: Aortic atherosclerosis. No enlarged abdominal or pelvic lymph nodes. Reproductive: Prostatomegaly. Other: No abdominal wall hernia or abnormality. No abdominopelvic ascites. Musculoskeletal: No acute or significant osseous findings. IMPRESSION: 1. Urinary bladder wall thickening and adjacent fat stranding, suggestive of cystitis. Correlate with urinalysis. 2. The rectum is mildly thickened, featureless, and fluid-filled. Findings are suggestive of nonspecific infectious or  inflammatory proctitis. 3. Prostatomegaly. 4. Trace bilateral pleural effusions and associated atelectasis or consolidation. Aortic Atherosclerosis (ICD10-I70.0). Electronically Signed   By: Eddie Candle M.D.   On: 04/06/2021 15:42   EEG adult  Result Date: 04/06/2021 Derek Jack, MD     04/06/2021  7:33 PM Routine EEG Report BRAILEN MACNEAL is a 65 y.o. male with a history of frontal CVA and encephalopathy who is undergoing an EEG to evaluate for seizures. Report: This EEG was acquired with electrodes placed according to the International 10-20 electrode system (including Fp1, Fp2, F3, F4, C3, C4, P3, P4, O1, O2, T3, T4, T5, T6, A1, A2, Fz, Cz, Pz). The following electrodes were missing or displaced: none. The occipital dominant rhythm was 7-8 Hz. This activity is reactive to stimulation. Drowsiness was manifested by background fragmentation; deeper stages of sleep were manifested by K complexes and sleep spindles. There was no focal slowing. There were no interictal epileptiform discharges. There were no electrographic seizures identified. Photic stimulation and hyperventilation were not performed. Impression and clinical correlation: This EEG was obtained while awake and asleep and is abnormal due to mild diffuse slowing, indicative of global cerebral dysfunction. Su Monks, MD [facility] [phone] If 7pm- 7am, please page neurology on call as listed in  AMION.    PHYSICAL EXAM Physical Exam  Constitutional: Appears well-developed and well-nourished.  Psych: Affect appropriate to situation Eyes: Normal external eye and conjunctiva. HENT: Normocephalic, no lesions, without obvious abnormality.   Musculoskeletal-no joint tenderness, deformity or swelling Cardiovascular: Normal rate and regular rhythm.  Respiratory: Effort normal, non-labored breathing saturations WNL GI: Soft.  No distension. There is no tenderness.  Skin: WDI   Neuro:  Mental Status: Alert, oriented  name/age/year/month/place, thought content appropriate.  Speech fluent without evidence of aphasia.  Able to follow commands without difficulty. Cranial Nerves: II:VFF III,IV, VI: ptosis not present, extra-ocular motions intact bilaterally pupils equal, round, reactive to light V,VII: smile symmetric, facial light touch sensation normal bilaterally VIII: hearing normal bilaterally IX,X: uvula rises symmetrically XI: bilateral shoulder shrug XII: midline tongue extension Motor: Right : Upper extremity   4+/5  Left:     Upper extremity   4+/5  Lower extremity   4+/5  Lower extremity   4+/5 There is slight  pronator drift in RUE Tone and bulk:normal tone throughout; no atrophy noted Sensory: light touch intact throughout, bilaterally Deep Tendon Reflexes: 2+ and symmetric biceps, patella Cerebellar: normal finger-to-nose,  Gait: deferred     ASSESSMENT/PLAN David [name] is a 65 y.o.  left handed black male  with current cocaine use, hypertension, dyslipidemia, and recent (02/2021) admission for left mca/pca territory strokes (discharged on aspirin and plavix but per pt report he never took them), who  presented to the hospital for altered mental status. Patient was seen falling by a bystander and EMS was called. On approach by EMS he was confused and had strong smell of urine. He was brought for further evaluation and management.  In the ED he was drowsy and difficult to arouse, but his answers were correct when he did speak. Family was called by ED and his son reports that patient is homeless.   CT head negative for any acute abnormalities. UDS positive for cocaine.CBC with no leukocytosis.  Mild anemia with hemoglobin 10.6.  CMP significant for AKI with creatinine 1.75 and BUN at 28.  COVID positive.  acid elevated at 3.6.      Chest x-ray negative for any acute abnormalities.  No evidence of pneumonia.  UA significant for small hematuria.  No signs of infection.  MRI brain shows acute to  subacute stroke at left corona radiata and right frontal white matter.   Stroke:  left corona radiata and right frontal white matter infarct embolic secondary to  small vessel disease source CT head No acute abnormality noted.Chronic ischemic changes are noted.  MRI  1. Acute to subacute small vessel infarcts in the left corona radiata and right frontal white matter. 2. Background of advanced chronic small vessel disease. MRA  1. Moderately motion degraded. 2. No emergent finding or convincing change from a CTA 3 weeks ago. 2D Echo  1. Left ventricular ejection fraction, by estimation, is 65 to 70%. The  left ventricle has hyperdynamic function. The left ventricle has no  regional wall motion abnormalities. Left ventricular diastolic parameters  are consistent with Grade I diastolic  dysfunction (impaired relaxation).   2. Right ventricular systolic function is normal. The right ventricular  size is normal. Tricuspid regurgitation signal is inadequate for assessing  PA pressure.   3. The mitral valve is normal in structure. No evidence of mitral valve  regurgitation. No evidence of mitral stenosis.   4. The aortic valve is tricuspid. Aortic valve regurgitation is not  visualized.  No aortic stenosis is present.   5. The inferior vena cava is normal in size with <50% respiratory  variability, suggesting right atrial pressure of 8 mmHg. LDL 103 HgbA1c 6.4 VTE prophylaxis - SCD's or other prophylaxis recommended    Diet   Diet Heart Room service appropriate? Yes; Fluid consistency: Thin   aspirin 81 mg daily and clopidogrel 75 mg daily prior to admission, now on aspirin 81 mg daily.  Therapy recommendations:  SNF Disposition:  pending  Hypertension Home meds:  none Stable Permissive hypertension (OK if < 220/120) but gradually normalize in 5-7 days Long-term BP goal normotensive  Hyperlipidemia Home meds:  lipitor 80 mg, resumed in hospital LDL 103, goal < 70 Continue statin at  discharge  Diabetes type II ( no formal diagnosis) Home meds:  none HgbA1c 6.4, goal < 7.0  Other Stroke Risk Factors Advanced Age >/= 59  Substance abuse - UDS:  THC NONE DETECTED, Cocaine POSITIVE. Patient advised to stop using due to stroke risk. Hx stroke   Other Active Problems COVID +: Covid precautions, ID involved  Hospital day # 2  Laurey Morale, MSN, NP-C [facility] [phone]   To contact Stroke Continuity provider, please refer to [email or web address] After hours, contact General Neurology

## 2021-04-07 NOTE — Plan of Care (Signed)
  Problem: Nutrition: Goal: Risk of aspiration will decrease Outcome: Progressing   Problem: Intracerebral Hemorrhage Tissue Perfusion: Goal: Complications of Intracerebral Hemorrhage will be minimized Outcome: Progressing   Problem: Ischemic Stroke/TIA Tissue Perfusion: Goal: Complications of ischemic stroke/TIA will be minimized Outcome: Progressing   Problem: Spontaneous Subarachnoid Hemorrhage Tissue Perfusion: Goal: Complications of Spontaneous Subarachnoid Hemorrhage will be minimized Outcome: Progressing   Problem: Clinical Measurements: Goal: Diagnostic test results will improve Outcome: Progressing Goal: Respiratory complications will improve Outcome: Progressing Goal: Cardiovascular complication will be avoided Outcome: Progressing   Problem: Coping: Goal: Level of anxiety will decrease Outcome: Progressing   Problem: Elimination: Goal: Will not experience complications related to urinary retention Outcome: Progressing   Problem: Safety: Goal: Ability to remain free from injury will improve Outcome: Progressing   Problem: Skin Integrity: Goal: Risk for impaired skin integrity will decrease Outcome: Progressing

## 2021-04-07 NOTE — Plan of Care (Signed)

## 2021-04-07 NOTE — Progress Notes (Signed)
FPTS Interim Progress Note  S:Patient seen and evaluated at bedside for nightly rounds. He was resting in bed comfortably and only complains of congestion.He is alert to person and place and says that he is in the hospital because "EMS brought me here."  O: BP (!) 148/98   Pulse (!) 102   Temp 100.2 F (37.9 C) (Oral)   Resp (!) 26   Ht 5\' 10"  (1.778 m)   Wt 68 kg   SpO2 96%   BMI 21.51 kg/m   Gen: comfortably resting in bed CV: RRR Lungs: CTAB Abd: soft, non-tender  EEG: mild diffuse slowing including of global cerebral dysfunction  A/P: Per day team  CVA -Neurochecks -Allowing for permissive hypertension (BP 148/98)  AMS/COVID 19 + -Continue IV abx -Orders reviewed. Labs ordered for am. -ORA maintaining O2 sats >96%. Hold Decadron -COWs -Tylenol q6h for fever, last temp 101.63F  Orvis Brill, DO 04/07/2021, 12:23 AM PGY-1, [place] [facility] pager [phone]

## 2021-04-07 NOTE — Progress Notes (Addendum)
Family Medicine Teaching Service Daily Progress Note Intern Pager: [phone]  Patient name: David [name] Medical record number: [id number] Date of birth: 1956-01-22 Age: 65 y.o. Gender: male  Primary Care Provider: Kinnie Feil, MD Consultants: Neurology Code Status: Full  Pt Overview and Major Events to Date:  8/7- admitted  Assessment and Plan: [name] is a 65yo male who presented with altered mental status and sepsis 2/2 COVID-19 infection, now with acute to subacute frontal CVA. PMH significant for CVA (July 2022), falls, HLD, HTN, prediabetes, cocaine use.   Acute to Subacute CVA Encephalopathy MRA Head yesterday without emergent finding or convincing change from CTA 3 weeks ago. Question whether he truly has a new stroke vs natural progression of stroke in July 2022. EEG obtained yesterday with mild diffuse slowing, indicative of global cerebral dysfunction. On exam he still has pinpoint pupils that are not reactive to light. Uncertain whether he may have taken long-acting synthetic opioids which may not have resulted on UTox. Neuro exam non-focal, though limited by patient cooperation. Folate wnl, RPR, ammonia pending.   Discussed case with Dr. Erlinda Hong, neuro, who felt that he may need an LP. Last received Lovenox this morning, so earliest possibility would be tomorrow. Neuro recommended bringing ID on board. Physical exam with no frank meningeal signs but severely limited by patient participation.  Seen by PT yesterday recommending SNF. Last COWS score 5.  -Neurology following, grateful for their recommendations -Consult to ID to determine whether he needs an LP at this point  -Holding Lovenox and Plavix for now -[facility] referral for cocaine cessation counseling -Neuro checks -ASA -Continue telemetry  -Continue monitoring COWS -Will need TOC consult for SNF placement once stabilized  COVID-19  Sepsis Afebrile for 24 hours. Remains on RA. Tachycardic to 110s during interview. WBC  remains wnl at 4.9. Procalcitonin 0.18. Blood and urine cxs with NGTD. MRSA swab neg. Day 2 of cefepime, flagyl, vancomycin. -Consult to ID as above with concern for meningitis/encephalitis -Continue remdesivir (day 2/5) -Can d/c vancomycin -Continue cefepime and flagyl pending ID consult -Continue to follow blood and urine cultures -Continue Tylenol q6h for fever  Abdominal Pain, resolved Not having any pain at this time. Abdomen non-tender to my exam. CT yesterday with bladder wall thickening and adjacent fat stranding, suggestive of cystitis. Rectum mildly thickened, featureless, and fluid-filled, suggestive of nonspecific infectious or inflammatory proctits. Prostatomegaly, and trace bilateral pleural effusions and associated atelectasis or consolidation. In setting of normal UA and low procalcitonin, white count, unlikely that cystitis or proctitis are ultimate cause of his symptoms - Continue to monitor   Elevated LFTs AST 69>78>105. ALT 43>40>51. Alk Phos and Bili wnl.  In the setting of his B12 elevated to 2355, consider that this may represent liver disease such as hepatitis, cirrhosis, HCC. CT Abd yesterday without solid liver abnormality.  - Will check acute hepatitis panel   AKI Cr 1.75>1.32>1.23. Nearing his baseline of 1.0-1.2. - Continue LR 170m/hr while PO intake remains poor  HTN BPs elevated to 160s/90s-110s. Given MRA findings, no need for further permissive htn. No home meds. -Can initiate Norvasc 555mdaily if remains high  HLD Lipid panel with LDL 103. Patient previously prescribed atorvastatin 8083m-Continue statin  BPH, chronic, stable -Continue Tamsulosin 0.4mg9mily  FEN/GI: Heart healthy  PPx: Lovenox (holding in preparation for possible LP tomorrow)  Dispo:SNF pending clinical improvement . Barriers include further workup and treatment.   Subjective:  Patient only answering questions with "yes" "no" "I do not  know".  Denies any abdominal pain.  Denies  headache, shortness of breath, chest pain.  Unable to tell me his name or where he is.  Objective: Temp:  [98.8 F (37.1 C)-103.1 F (39.5 C)] 99 F (37.2 C) (08/08 0400) Pulse Rate:  [88-108] 102 (08/08 0400) Resp:  [17-37] 21 (08/08 0400) BP: (146-178)/(92-111) 164/92 (08/08 0400) SpO2:  [94 %-100 %] 97 % (08/08 0400) Physical Exam: General: Chronically ill-appearing, lying in bed with many blankets Cardiovascular: Tachycardic, regular rhythm, no murmur Respiratory: Clear to auscultation anteriorly, normal work of breathing on room air Abdomen: Soft, nontender, nondistended Extremities: Without edema Neuro: Exam limited by patient participation. Awakens to voice, does not open eyes on command, moves all 4 extremities independently, pinpoint pupils bilaterally, neck lateral rotation observed  Laboratory: Recent Labs  Lab 04/05/21 2015 04/06/21 0426 04/07/21 0344  WBC 6.5 5.5 4.9  HGB 12.6* 10.6* 12.4*  HCT 38.5* 31.7* 37.0*  PLT 262 268 199   Recent Labs  Lab 04/05/21 2015 04/06/21 0426 04/07/21 0344  NA 140 136 132*  K 4.9 4.6 3.9  CL 108 107 99  CO2 22 20* 22  BUN 28* 16 12  CREATININE 1.75* 1.32* 1.23  CALCIUM 8.9 8.0* 8.2*  PROT 8.1 6.5 7.4  BILITOT 0.8 0.9 0.8  ALKPHOS 85 67 68  ALT 43 40 51*  AST 69* 78* 105*  GLUCOSE 166* 101* 95    Procalciton 0.18  Imaging/Diagnostic Tests: MRA Head IMPRESSION: Moderately motion degraded. No emergent finding or convincing change from CTA 3 weeks ago.  CT Abdomen Pelvis 1. Urinary bladder wall thickening and adjacent fat stranding, suggestive of cystitis. Correlate with urinalysis. 2. The rectum is mildly thickened, featureless, and fluid-filled. Findings are suggestive of nonspecific infectious or inflammatory proctitis. 3. Prostatomegaly. 4. Trace bilateral pleural effusions and associated atelectasis or consolidation.  Eppie Gibson, MD 04/07/2021, 8:41 AM PGY-1, [place]  Intern pager: [phone], text pages welcome

## 2021-04-07 NOTE — Progress Notes (Addendum)
Pharmacy Antibiotic Note  David [name] is a 65 y.o. male admitted on 04/05/2021 with COVID, altered mental status, and concern for sepsis. Now concern for meningitis/encephalitis. Pharmacy has been consulted for vancomycin and acyclovir dosing. ID is also starting ampicillin and ceftriaxone.  Patient's baseline SCr ~1. On admission, SCr was elevated at 1.75, and is now trending down at 1.23.   Patient received a loading dose of vancomycin 1250mg  IV x 1 on 8/6, followed by vancomycin 1000mg  IV on 8/7 at 1932.  MRSA PCR came back negative, and vancomycin was discontinued per MD on 8/8.   Per ID consult, will restart vancomycin and start acyclovir d/t concern for possible meningitis/encephalitis. LP scheduled for tomorrow d/t enoxaparin dose this morning.   Plan: Vancomycin 1000mg  IV every 24 hours.  Acyclovir 680mg  (10mg /kg TBW) IV every 8 hours.  Follow up levels as appropriate. Monitor LP and meningitis/encephalitis panel. Monitor renal function and for potential nephrotoxic agents.  Height: 5\' 10"  (177.8 cm) Weight: 68 kg (149 lb 14.6 oz) IBW/kg (Calculated) : 73  Temp (24hrs), Avg:100.1 F (37.8 C), Min:98.8 F (37.1 C), Max:101.5 F (38.6 C)  Recent Labs  Lab 04/05/21 2015 04/05/21 2019 04/05/21 2223 04/06/21 0157 04/06/21 0343 04/06/21 0426 04/07/21 0344  WBC 6.5  --   --   --   --  5.5 4.9  CREATININE 1.75*  --   --   --   --  1.32* 1.23  LATICACIDVEN  --  3.9* 3.6* 1.1 1.3  --   --     Estimated Creatinine Clearance: 56.8 mL/min (by C-G formula based on SCr of 1.23 mg/dL).    No Known Allergies  Antimicrobials this admission:  Acyclovir 8/8 >> Ampicillin 8/8 >> Ceftriaxone 8/8 >> Vancomycin 8/6 >> Remdesivir 8/6 >>  Cefepime 8/6 >> 8/8 Metronidazole 8/6 >> 8/8  Dose adjustments this admission:   Microbiology results: 8/6 Resp Cx: COVID positive  8/6 BCx: ng 8/6 UCx: ng  8/7 MRSA PCR: not detected 8/9 LP planned    Thank you for allowing pharmacy  to be a part of this patient's care.  Vance Peper, PharmD PGY1 Pharmacy Resident 04/07/2021 11:18 AM  Please check AMION for all [place] phone numbers After 10:00 PM, call [place] [phone]

## 2021-04-07 NOTE — Consult Note (Addendum)
[facility] for Infectious Diseases                                                                                        Patient Identification: Patient Name: David [name] MRN: [id number] [place] Date: 04/05/2021  8:07 PM Today's Date: 04/07/2021 Reason for consult: Altered mental status/concern for encephalitis  Requesting provider: Andrena Mews   Active Problems:   Altered mental status   Antibiotics: Vancomycin 8/6- current                     Cefepime 8/6-current                     Metronidazole 8/6-current   Lines/Tubes: Extrenal urethal catheter, PIV   Assessment Fevers/altered mental status  In the setting of positive COVID, cocaine abuse  Blood cx 8/6 no growth in < 12 hrs  UA unremarkable Patient is able to tell me the name, he is in the hospital but follows commands intermittently Neurological exam is non focal and +/- neck stiffness EEG 8/7 mild diffuse slowing, indicative of global cerebral dysfunction. ? Meningitis/encephalitis is a possibility, cannot rule it out without an LP although this could be related to [place] with Neurology to have LP   SARScov2 positive - Chest xray with no PNA  Acute /subacute CVA in the left corona radiata and rt frontal white matter Neurology following   Recommendations  Will adjust antibiotics to Vancomycin, ceftriaxone and ampicillin/acyclovir for now pending LP  Planned for LP tomorrow. Please send for cell counts, glucose , protein, gram stain and cultures, HSV 1/2 PCR Meninigitis/Encephalitis PCR panel ( send out test,) Follow up blood cultures  COVID management per primary  Isolation precautions per IP  Following   Rest of the management as per the primary team. Please call with questions or concerns.  Thank you for the consult  Rosiland Oz, MD Infectious Disease Physician [facility] for Infectious Disease [address] [place], [place] [address] Phone: [phone]  Fax: [phone]  __________________________________________________________________________________________________________ HPI and Hospital Course: 65 year old male with a PMH of CVA, hypertension, hyperlipidemia, BPH and cocaine abuse who presented to the ED on 8/6 with altered mental status.  History is obtained from chart review as patient is altered.  Bystanders saw patient fell down.  EMS noted patient to be altered, febrile with temperature 106.6 with IV fluid given prior to arrival  At ED, patient had rigors, febrile tachycardic and tachypneic but was able to answer where he is, full name and year.  He also complained of headache.  Started on IV fluids and broad-spectrum antibiotics  Labs was remarkable for with creatinine 1.75, AST 69.  WBC 6.5.  Lactic acid 3.9 Blood cultures 8/6 No growth in less than 12 hrs  SARS Cov  2 PCR positive.  Influenza A and B negative Chest xray with no acute changes CT head unremarkable  Upon evaluation, patient does not participate in conversation. He is able to tell me he is in the hospital and year, otherwise does not answers much. Denies headache, photophobia or blurry  vision. Denies neck pain or stiffness. Spoke with RN who says patient is able to tell the name and in the [place] but otherwise nothing much   MRI brain 8/7 Acute to subacute small vessel infarcts in the left corona radiata and right frontal white matter.  ROS: Limited as patient is confused  Past Medical History:  Diagnosis Date   AKI (acute kidney injury) ([place]) 09/01/2017   Benign prostatic hyperplasia    Cerebral infarction ([place]) 08/04/2017   Past Surgical History:  Procedure Laterality Date   SHOULDER ARTHROSCOPY W/ ROTATOR CUFF REPAIR  10/21/10   At [facility], R shoulder     Scheduled Meds:  aspirin EC  81 mg Oral Daily   atorvastatin  80 mg Oral Daily   tamsulosin  0.4 mg Oral Daily   Continuous  Infusions:  ceFEPime (MAXIPIME) IV 2 g (04/06/21 2114)   metronidazole Stopped (04/07/21 0627)   remdesivir 100 mg in NS 100 mL     PRN Meds:.acetaminophen  No Known Allergies  Social History   Socioeconomic History   Marital status: Single    Spouse name: Not on file   Number of children: Not on file   Years of education: Not on file   Highest education level: Not on file  Occupational History   Not on file  Tobacco Use   Smoking status: Never   Smokeless tobacco: Never  Substance and Sexual Activity   Alcohol use: No    Comment: occasion   Drug use: No   Sexual activity: Not on file  Other Topics Concern   Not on file  Social History Narrative   Not on file   Social Determinants of Health   Financial Resource Strain: Not on file  Food Insecurity: Not on file  Transportation Needs: Not on file  Physical Activity: Not on file  Stress: Not on file  Social Connections: Not on file  Intimate Partner Violence: Not on file    Vitals BP 93/69 (BP Location: Left Arm)   Pulse (!) 115   Temp 99 F (37.2 C) (Oral)   Resp (!) 23   Ht 5\' 10"  (1.778 m)   Wt 68 kg   SpO2 98%   BMI 21.51 kg/m    Physical Exam Constitutional:  Lying in bed, not in acute distress     Comments:   Cardiovascular:     Rate and Rhythm: Normal rate and regular rhythm.     Heart sounds:   Pulmonary:     Effort: Pulmonary effort is normal.     Comments: clear lung fields bilaterally   Abdominal:     Palpations: Abdomen is soft.     Tenderness: Non tender and non distended   Musculoskeletal:        General: No swelling or tenderness.   Skin:    Comments: No lesions or rashes   Neurological:     General: No focal deficit present. Able to tell me he is in the hospital and year but otherwise does not participate in history. No neck stiffness   Psychiatric:        Mood and Affect: Unable to assess   Pertinent Microbiology Results for orders placed or performed during the  hospital encounter of 04/05/21  Blood culture (routine x 2)     Status: None (Preliminary result)   Collection Time: 04/05/21  8:15 PM   Specimen: BLOOD RIGHT ARM  Result Value Ref Range Status   Specimen Description BLOOD RIGHT ARM  Final  Special Requests   Final    BOTTLES DRAWN AEROBIC AND ANAEROBIC Blood Culture adequate volume   Culture   Final    NO GROWTH < 12 HOURS Performed at [facility], [place] [address], [place], [place] [address]    Report Status PENDING  Incomplete  Resp Panel by RT-PCR (Flu A&B, Covid) Nasopharyngeal Swab     Status: Abnormal   Collection Time: 04/05/21  8:26 PM   Specimen: Nasopharyngeal Swab; Nasopharyngeal(NP) swabs in vial transport medium  Result Value Ref Range Status   SARS Coronavirus 2 by RT PCR POSITIVE (A) NEGATIVE Final    Comment: RESULT CALLED TO, READ BACK BY AND VERIFIED WITH: K RUSSY,RN@[id number]  04/05/21 [place] (NOTE) SARS-CoV-2 target nucleic acids are DETECTED.  The SARS-CoV-2 RNA is generally detectable in upper respiratory specimens during the acute phase of infection. Positive results are indicative of the presence of the identified virus, but do not rule out bacterial infection or co-infection with other pathogens not detected by the test. Clinical correlation with patient history and other diagnostic information is necessary to determine patient infection status. The expected result is Negative.  Fact Sheet for Patients: EntrepreneurPulse.com.au  Fact Sheet for Healthcare Providers: IncredibleEmployment.be  This test is not yet approved or cleared by the [place] FDA and  has been authorized for detection and/or diagnosis of SARS-CoV-2 by FDA under an Emergency Use Authorization (EUA).  This EUA will remain in effect (meaning this test can be used)  for the duration of  the COVID-19 declaration under Section 564(b)(1) of the Act, 21 U.S.C. section 360bbb-3(b)(1), unless the  authorization is terminated or revoked sooner.     Influenza A by PCR NEGATIVE NEGATIVE Final   Influenza B by PCR NEGATIVE NEGATIVE Final    Comment: (NOTE) The Xpert Xpress SARS-CoV-2/FLU/RSV plus assay is intended as an aid in the diagnosis of influenza from Nasopharyngeal swab specimens and should not be used as a sole basis for treatment. Nasal washings and aspirates are unacceptable for Xpert Xpress SARS-CoV-2/FLU/RSV testing.  Fact Sheet for Patients: EntrepreneurPulse.com.au  Fact Sheet for Healthcare Providers: IncredibleEmployment.be  This test is not yet approved or cleared by the [place] FDA and has been authorized for detection and/or diagnosis of SARS-CoV-2 by FDA under an Emergency Use Authorization (EUA). This EUA will remain in effect (meaning this test can be used) for the duration of the COVID-19 declaration under Section 564(b)(1) of the Act, 21 U.S.C. section 360bbb-3(b)(1), unless the authorization is terminated or revoked.  Performed at [facility], [place] [address], [place], [place] [address]   Blood culture (routine x 2)     Status: None (Preliminary result)   Collection Time: 04/05/21  8:41 PM   Specimen: BLOOD  Result Value Ref Range Status   Specimen Description BLOOD RIGHT ANTECUBITAL  Final   Special Requests   Final    BOTTLES DRAWN AEROBIC AND ANAEROBIC Blood Culture adequate volume   Culture   Final    NO GROWTH < 12 HOURS Performed at [facility], [place] [address] [place], [place] [address]    Report Status PENDING  Incomplete  Urine Culture     Status: None   Collection Time: 04/05/21 10:18 PM   Specimen: In/Out Cath Urine  Result Value Ref Range Status   Specimen Description IN/OUT CATH URINE  Final   Special Requests NONE  Final   Culture   Final    NO GROWTH Performed at [facility], [address], [place], [place] [address]    Report Status 04/07/2021 FINAL  Final   MRSA Next Gen by PCR, Nasal     Status: None   Collection Time: 04/06/21  8:51 AM   Specimen: Nasal Mucosa; Nasal Swab  Result Value Ref Range Status   MRSA by PCR Next Gen NOT DETECTED NOT DETECTED Final    Comment: (NOTE) The GeneXpert MRSA Assay (FDA approved for NASAL specimens only), is one component of a comprehensive MRSA colonization surveillance program. It is not intended to diagnose MRSA infection nor to guide or monitor treatment for MRSA infections. Test performance is not FDA approved in patients less than 81 years old. Performed at [facility], [place] [address] [place],  [address]      Pertinent Lab seen by me: CBC Latest Ref Rng & Units 04/07/2021 04/06/2021 04/05/2021  WBC 4.0 - 10.5 K/uL 4.9 5.5 6.5  Hemoglobin 13.0 - 17.0 g/dL 12.4(L) 10.6(L) 12.6(L)  Hematocrit 39.0 - 52.0 % 37.0(L) 31.7(L) 38.5(L)  Platelets 150 - 400 K/uL 199 268 262   CMP Latest Ref Rng & Units 04/07/2021 04/06/2021 04/05/2021  Glucose 70 - 99 mg/dL 95 101(H) 166(H)  BUN 8 - 23 mg/dL 12 16 28(H)  Creatinine 0.61 - 1.24 mg/dL 1.23 1.32(H) 1.75(H)  Sodium 135 - 145 mmol/L 132(L) 136 140  Potassium 3.5 - 5.1 mmol/L 3.9 4.6 4.9  Chloride 98 - 111 mmol/L 99 107 108  CO2 22 - 32 mmol/L 22 20(L) 22  Calcium 8.9 - 10.3 mg/dL 8.2(L) 8.0(L) 8.9  Total Protein 6.5 - 8.1 g/dL 7.4 6.5 8.1  Total Bilirubin 0.3 - 1.2 mg/dL 0.8 0.9 0.8  Alkaline Phos 38 - 126 U/L 68 67 85  AST 15 - 41 U/L 105(H) 78(H) 69(H)  ALT 0 - 44 U/L 51(H) 40 43     Pertinent Imagings/Other Imagings Plain films and CT images have been personally visualized and interpreted; radiology reports have been reviewed. Decision making incorporated into the Impression / Recommendations.  CT abdomen/pelbvis IMPRESSION: 1. Urinary bladder wall thickening and adjacent fat stranding, suggestive of cystitis. Correlate with urinalysis. 2. The rectum is mildly thickened, featureless, and fluid-filled. Findings are suggestive of  nonspecific infectious or inflammatory proctitis. 3. Prostatomegaly. 4. Trace bilateral pleural effusions and associated atelectasis or consolidation.   Aortic Atherosclerosis (ICD10-I70.0).  MRA head 04/07/21 IMPRESSION: 1. Moderately motion degraded. 2. No emergent finding or convincing change from a CTA 3 weeks ago.  MRI brain 04/06/21 IMPRESSION: 1. Acute to subacute small vessel infarcts in the left corona radiata and right frontal white matter. 2. Background of advanced chronic small vessel disease.  CT head 04/05/21 IMPRESSION: No acute abnormality noted.   Chronic ischemic changes are noted.   I spent more than 70  minutes for this patient encounter including review of prior medical records/discussing diagnostics and treatment plan with the patient/family/coordinate care with primary/other specialits with greater than 50% of time in face to face encounter.   Electronically signed by:   Rosiland Oz, MD Infectious Disease Physician [facility] for Infectious Disease Pager: [phone]

## 2021-04-08 DIAGNOSIS — A4189 Other specified sepsis: Secondary | ICD-10-CM | POA: Diagnosis not present

## 2021-04-08 DIAGNOSIS — U071 COVID-19: Secondary | ICD-10-CM | POA: Diagnosis not present

## 2021-04-08 DIAGNOSIS — F1410 Cocaine abuse, uncomplicated: Secondary | ICD-10-CM | POA: Diagnosis not present

## 2021-04-08 DIAGNOSIS — I6330 Cerebral infarction due to thrombosis of unspecified cerebral artery: Secondary | ICD-10-CM | POA: Diagnosis not present

## 2021-04-08 DIAGNOSIS — R4182 Altered mental status, unspecified: Secondary | ICD-10-CM | POA: Diagnosis not present

## 2021-04-08 LAB — CBC WITH DIFFERENTIAL/PLATELET
Abs Immature Granulocytes: 0.01 10*3/uL (ref 0.00–0.07)
Basophils Absolute: 0 10*3/uL (ref 0.0–0.1)
Basophils Relative: 1 %
Eosinophils Absolute: 0 10*3/uL (ref 0.0–0.5)
Eosinophils Relative: 0 %
HCT: 35.4 % — ABNORMAL LOW (ref 39.0–52.0)
Hemoglobin: 11.8 g/dL — ABNORMAL LOW (ref 13.0–17.0)
Immature Granulocytes: 0 %
Lymphocytes Relative: 46 %
Lymphs Abs: 1.6 10*3/uL (ref 0.7–4.0)
MCH: 28.1 pg (ref 26.0–34.0)
MCHC: 33.3 g/dL (ref 30.0–36.0)
MCV: 84.3 fL (ref 80.0–100.0)
Monocytes Absolute: 0.3 10*3/uL (ref 0.1–1.0)
Monocytes Relative: 9 %
Neutro Abs: 1.5 10*3/uL — ABNORMAL LOW (ref 1.7–7.7)
Neutrophils Relative %: 44 %
Platelets: 185 10*3/uL (ref 150–400)
RBC: 4.2 MIL/uL — ABNORMAL LOW (ref 4.22–5.81)
RDW: 14.5 % (ref 11.5–15.5)
WBC: 3.4 10*3/uL — ABNORMAL LOW (ref 4.0–10.5)
nRBC: 0 % (ref 0.0–0.2)

## 2021-04-08 LAB — CSF CELL COUNT WITH DIFFERENTIAL
RBC Count, CSF: 2 /mm3 — ABNORMAL HIGH
RBC Count, CSF: 0 /mm3
Tube #: 1
Tube #: 4
WBC, CSF: 1 /mm3 (ref 0–5)
WBC, CSF: 2 /mm3 (ref 0–5)

## 2021-04-08 LAB — COMPREHENSIVE METABOLIC PANEL
ALT: 41 U/L (ref 0–44)
AST: 90 U/L — ABNORMAL HIGH (ref 15–41)
Albumin: 2.5 g/dL — ABNORMAL LOW (ref 3.5–5.0)
Alkaline Phosphatase: 61 U/L (ref 38–126)
Anion gap: 8 (ref 5–15)
BUN: 13 mg/dL (ref 8–23)
CO2: 24 mmol/L (ref 22–32)
Calcium: 7.8 mg/dL — ABNORMAL LOW (ref 8.9–10.3)
Chloride: 102 mmol/L (ref 98–111)
Creatinine, Ser: 1.19 mg/dL (ref 0.61–1.24)
GFR, Estimated: >60 mL/min (ref >60)
Glucose, Bld: 112 mg/dL — ABNORMAL HIGH (ref 70–99)
Potassium: 3.1 mmol/L — ABNORMAL LOW (ref 3.5–5.1)
Sodium: 134 mmol/L — ABNORMAL LOW (ref 135–145)
Total Bilirubin: 0.7 mg/dL (ref 0.3–1.2)
Total Protein: 6.4 g/dL — ABNORMAL LOW (ref 6.5–8.1)

## 2021-04-08 LAB — PROTEIN AND GLUCOSE, CSF
Glucose, CSF: 64 mg/dL (ref 40–70)
Total  Protein, CSF: 21 mg/dL (ref 15–45)

## 2021-04-08 LAB — PROCALCITONIN: Procalcitonin: 0.5 ng/mL

## 2021-04-08 LAB — CRYPTOCOCCAL ANTIGEN, CSF: Crypto Ag: NEGATIVE

## 2021-04-08 MED ORDER — SENNA 8.6 MG PO TABS
1.0000 | ORAL_TABLET | Freq: Every day | ORAL | Status: DC
  Administered 2021-04-08 – 2021-04-25 (×16): 8.6 mg via ORAL
  Filled 2021-04-08 (×16): qty 1

## 2021-04-08 MED ORDER — ENOXAPARIN SODIUM 40 MG/0.4ML IJ SOSY: 40.0000 mg | PREFILLED_SYRINGE | INTRAMUSCULAR | Status: DC

## 2021-04-08 MED ORDER — POLYETHYLENE GLYCOL 3350 17 G PO PACK
17.0000 g | PACK | Freq: Two times a day (BID) | ORAL | Status: DC
  Administered 2021-04-09 – 2021-04-23 (×10): 17 g via ORAL
  Filled 2021-04-08 (×21): qty 1

## 2021-04-08 MED ORDER — POTASSIUM CHLORIDE CRYS ER 20 MEQ PO TBCR: 40.0000 meq | EXTENDED_RELEASE_TABLET | Freq: Two times a day (BID) | ORAL | Status: DC

## 2021-04-08 MED ORDER — LIDOCAINE HCL (PF) 1 % IJ SOLN
5.0000 mL | Freq: Once | INTRAMUSCULAR | Status: AC
  Administered 2021-04-08: 5 mL via INTRADERMAL
  Filled 2021-04-08: qty 5

## 2021-04-08 MED ORDER — LACTATED RINGERS IV SOLN: INTRAVENOUS | Status: DC

## 2021-04-08 MED ORDER — CLOPIDOGREL BISULFATE 75 MG PO TABS
75.0000 mg | ORAL_TABLET | Freq: Every day | ORAL | Status: DC
  Administered 2021-04-09 – 2021-04-25 (×17): 75 mg via ORAL
  Filled 2021-04-08 (×18): qty 1

## 2021-04-08 MED ORDER — POTASSIUM CHLORIDE CRYS ER 20 MEQ PO TBCR
40.0000 meq | EXTENDED_RELEASE_TABLET | Freq: Two times a day (BID) | ORAL | Status: DC
  Filled 2021-04-08: qty 2

## 2021-04-08 MED ORDER — LIDOCAINE HCL 1 % IJ SOLN
5.0000 mL | Freq: Once | INTRAMUSCULAR | Status: DC
  Filled 2021-04-08: qty 5

## 2021-04-08 MED ORDER — POTASSIUM CHLORIDE CRYS ER 20 MEQ PO TBCR
40.0000 meq | EXTENDED_RELEASE_TABLET | Freq: Four times a day (QID) | ORAL | Status: AC
  Administered 2021-04-08 (×2): 40 meq via ORAL
  Filled 2021-04-08: qty 2

## 2021-04-08 MED ORDER — ENOXAPARIN SODIUM 40 MG/0.4ML IJ SOSY
40.0000 mg | PREFILLED_SYRINGE | Freq: Every day | INTRAMUSCULAR | Status: DC
  Administered 2021-04-09 – 2021-04-18 (×10): 40 mg via SUBCUTANEOUS
  Filled 2021-04-08 (×11): qty 0.4

## 2021-04-08 NOTE — Progress Notes (Signed)
STROKE TEAM PROGRESS NOTE   INTERVAL HISTORY No family at bedside. Patient is mildly lethargic, however afebrile today.  More awake alert per RN, follows simple commands and moving all extremities.  LP performed today, CSF analysis pending.  Vitals:   04/07/21 2003 04/08/21 0010 04/08/21 0346 04/08/21 0715  BP: 119/79 105/75 117/68 109/75  Pulse: 98 98 84 88  Resp: 16 16 20 20   Temp: 98.7 F (37.1 C) 98.5 F (36.9 C) 98.4 F (36.9 C) 98.3 F (36.8 C)  TempSrc: Oral Oral Oral Oral  SpO2: 98% 96% 97% 98%  Weight:      Height:       CBC:  Recent Labs  Lab 04/07/21 0344 04/08/21 0443  WBC 4.9 3.4*  NEUTROABS 1.8 1.5*  HGB 12.4* 11.8*  HCT 37.0* 35.4*  MCV 83.5 84.3  PLT 199 161   Basic Metabolic Panel:  Recent Labs  Lab 04/07/21 0344 04/08/21 0443  NA 132* 134*  K 3.9 3.1*  CL 99 102  CO2 22 24  GLUCOSE 95 112*  BUN 12 13  CREATININE 1.23 1.19  CALCIUM 8.2* 7.8*   Lipid Panel:  Recent Labs  Lab 04/06/21 0800  CHOL 156  TRIG 55  HDL 42  CHOLHDL 3.7  VLDL 11  LDLCALC 103*   HgbA1c:  on 03/14/21: 6.4 Urine Drug Screen:  Recent Labs  Lab 04/05/21 2237  LABOPIA NONE DETECTED  COCAINSCRNUR POSITIVE*  LABBENZ NONE DETECTED  AMPHETMU NONE DETECTED  THCU NONE DETECTED  LABBARB NONE DETECTED    Alcohol Level  Recent Labs  Lab 04/05/21 2238  ETH <10    IMAGING past 24 hours No results found.  PHYSICAL EXAM Physical Exam  Constitutional: Appears well-developed and well-nourished.  Eyes: Normal external eye and conjunctiva. HENT: Normocephalic, no lesions, without obvious abnormality.   Musculoskeletal-no joint tenderness, deformity or swelling Cardiovascular: Normal rate and regular rhythm.  Respiratory: Effort normal, non-labored breathing saturations WNL GI: Soft.  No distension. There is no tenderness.  Skin: WDI  Neuro:  patient mildly lethargic, but awake alert, eyes open, orientated to place, age and year, however not to months  (consideration on the age).  No aphasia, however paucity of speech, able to follow some commands, able to name and repeat.  Mild to moderate dysarthria.  Neck supple, no nuchal rigidity.  Visual fields full, no gaze palsy, facial symmetrical.  Bilateral upper and lower extremity equal strengths, sensation symmetrical, finger-to-nose intact bilaterally.   ASSESSMENT/PLAN David [name] is a 65 y.o.  left handed black male  with current cocaine use, hypertension, dyslipidemia, and recent (02/2021) admission for left mca/pca territory strokes (discharged on aspirin and plavix but per pt report he never took them), who  presented to the hospital for altered mental status. Patient was seen falling by a bystander and EMS was called. On approach by EMS he was confused and had strong smell of urine. He was brought for further evaluation and management.  In the ED he was drowsy and difficult to arouse, but his answers were correct when he did speak. Family was called by ED and his son reports that patient is homeless.   CT head negative for any acute abnormalities. UDS positive for cocaine.CBC with no leukocytosis.  Mild anemia with hemoglobin 10.6.  CMP significant for AKI with creatinine 1.75 and BUN at 28.  COVID positive.  acid elevated at 3.6.      Chest x-ray negative for any acute abnormalities.  No evidence of pneumonia.  UA significant for small hematuria.  No signs of infection.  MRI brain shows acute to subacute stroke at left corona radiata and right frontal white matter.   AMS Fever  COVID + Consistent with encephalopathy, likely related to COVID, fever, stroke, substance abuse LP done to rule out meningitis / encephalitis, CSF pending Tmax 101.5->101.1-> afebrile COVID + on remedesivir  On ampicillin, vancomycin, Rocephin and acyclovir for neuro coverage ID on board  Stroke:  left corona radiata and right frontal white matter infarct embolic secondary to  small vessel disease source CT head  No acute abnormality noted. MRI Acute to subacute small vessel infarcts in the left corona radiata and right frontal white matter. MRA  No emergent finding or convincing change from a CTA 3 weeks ago. 2D Echo EF 65 to 70%.  LDL 103 HgbA1c 6.4 UDS + cocaine VTE prophylaxis - SCDs now given LP today aspirin 81 mg daily and clopidogrel 75 mg daily (not taking) prior to admission, now on aspirin 81 mg daily. No DAPT for LP today Therapy recommendations:  SNF Disposition:  pending  Hx of stroke 07/2017 with left BG/CR acute infarct, old left pontine and right caudate infarct.  MRA and carotid Doppler negative.  A1c 6.2, LDL 114, UDS positive for cocaine and THC, EF 60 to 65%.  Discharged on aspirin and Lipitor, with residual mild left-sided weakness. 02/2021 he was admitted again for acute stroke at left CR and left MCA/ACA border zone.  CTA head and neck left A2 stenosis, possible BA moderate stenosis.  EF 55 to 70%, LDL 133, A1c 6.4.  UDS negative at the time.  Discharged with DAPT and Lipitor 40.  Cocaine abuse UDS positive for cocaine Cocaine cessation education provided Patient is willing to quit  Hypertension Home meds:  none Stable Long-term BP goal normotensive  Hyperlipidemia Home meds:  lipitor 80 mg, resumed in hospital LDL 103, goal < 70 Continue statin at discharge  Other Stroke Risk Factors Advanced Age >/= 70   Other Active Problems Homeless Leukopenia WBC 3.4 Hypokalemia K3.1 Elevated LFTs AST/ALT 105/51-> 90/41, acute hepatitis panel negative  Hospital day # 3  Rosalin Hawking, MD PhD Stroke Neurology 04/08/2021 5:11 PM   To contact Stroke Continuity provider, please refer to [email or web address] After hours, contact General Neurology

## 2021-04-08 NOTE — Procedures (Signed)
Procedure Note: Lumbar puncture  Indications: assess for CNS pathology   Operator: Rosalin Hawking, MD, PhD  Others present: Ty, RN  Indications, risks, and benefits explained to patient / surrogate decision maker and informed consent obtained. Time-out was performed, with all individuals present agreeing on the procedure to be performed, the site of procedure, and the patient identity.  Patient positioned, prepped and draped in usual sterile fashion. L3-4 space located using bilateral iliac crests as landmarks. 1% Lidocaine without epinephrine was used to anesthetize the area. An 20G spinal needle was introduced into the sub- arachnoid space. Stylet was removed with appropriate fluid return. Needle removed after adequate fluid collected. Blood loss was minimal. A dry guaze dressing was placed over insertion site. Patient tolerated the procedure well and no complications were observed. Spontaneous movement of bilateral extremities were observed after the procedure.  Opening pressure: not measured  Total fluid removed: 10cc  Color of fluid: clear  Sent for: cell count + diff , gram stain, cultures, glucose, protein, crypto antigen, HSV PCR, VDRL  Rosalin Hawking, MD PhD Stroke Neurology 04/08/2021 2:35 PM

## 2021-04-08 NOTE — TOC CAGE-AID Note (Signed)
Transition of Care [facility]) - CAGE-AID Screening   Patient Details  Name: David [name] MRN: [id number] Date of Birth: [birth year]  Transition of Care [facility]) CM/SW Contact:    Maelee Hoot C Tarpley-Carter, [place] Phone Number: 04/08/2021, 3:49 PM   Clinical Narrative:  Pt is unable to participate in Cage Aid.   Khaliya Golinski Tarpley-Carter, MSW, LCSW-A Pronouns:  She/Her/Hers [facility]itions [facility] Clinical Social Worker Direct Number:  [phone] [email or web address]    CAGE-AID Screening: Substance Abuse Screening unable to be completed due to: : Patient unable to participate

## 2021-04-08 NOTE — Progress Notes (Signed)
PT Cancellation Note  Patient Details Name: David [name] MRN: [id number] DOB: 11-02-1955   Cancelled Treatment:    Reason Eval/Treat Not Completed: Patient at procedure or test/unavailable - lumbar puncture at 1435, bedrest x4 hours. PT to check back tomorrow.   Stacie Glaze, PT DPT Acute Rehabilitation Services Pager [phone]  Office [phone]    Louis Matte 04/08/2021, 3:36 PM

## 2021-04-08 NOTE — Progress Notes (Signed)
FPTS Interim Night Progress Note  S:Patient sleeping comfortably.  Afebrile overnight.  Rounded with primary night RN. She reports that son and sister called.  Son wants update about fathers status prior to LP. No other concerns voiced.  No orders required.    O: Today's Vitals   04/07/21 1700 04/07/21 2003 04/08/21 0010 04/08/21 0346  BP: 116/80 119/79 105/75 117/68  Pulse: 90 98 98 84  Resp: (!) 24 16 16 20   Temp: 98.2 F (36.8 C) 98.7 F (37.1 C) 98.5 F (36.9 C) 98.4 F (36.9 C)  TempSrc: Oral Oral Oral Oral  SpO2: 98% 98% 96% 97%  Weight:      Height:      PainSc:  0-No pain 0-No pain 0-No pain      A/P: For LP in am per neuro Will have day team reach out to son for update Continue current management  Carollee Leitz MD PGY-3, [facility] pager [phone]

## 2021-04-08 NOTE — Plan of Care (Signed)
  Problem: Education: Goal: Knowledge of disease or condition will improve Outcome: Progressing Goal: Knowledge of secondary prevention will improve Outcome: Progressing Goal: Knowledge of patient specific risk factors addressed and post discharge goals established will improve Outcome: Progressing   Problem: Coping: Goal: Will verbalize positive feelings about self Outcome: Progressing Goal: Will identify appropriate support needs Outcome: Progressing   Problem: Health Behavior/Discharge Planning: Goal: Ability to manage health-related needs will improve Outcome: Progressing   Problem: Self-Care: Goal: Ability to participate in self-care as condition permits will improve Outcome: Progressing Goal: Verbalization of feelings and concerns over difficulty with self-care will improve Outcome: Progressing Goal: Ability to communicate needs accurately will improve Outcome: Progressing   Problem: Nutrition: Goal: Risk of aspiration will decrease Outcome: Progressing Goal: Dietary intake will improve Outcome: Progressing   Problem: Intracerebral Hemorrhage Tissue Perfusion: Goal: Complications of Intracerebral Hemorrhage will be minimized Outcome: Progressing   Problem: Ischemic Stroke/TIA Tissue Perfusion: Goal: Complications of ischemic stroke/TIA will be minimized Outcome: Progressing   Problem: Spontaneous Subarachnoid Hemorrhage Tissue Perfusion: Goal: Complications of Spontaneous Subarachnoid Hemorrhage will be minimized Outcome: Progressing   Problem: Education: Goal: Knowledge of General Education information will improve Description: Including pain rating scale, medication(s)/side effects and non-pharmacologic comfort measures Outcome: Progressing   Problem: Health Behavior/Discharge Planning: Goal: Ability to manage health-related needs will improve Outcome: Progressing   Problem: Clinical Measurements: Goal: Ability to maintain clinical measurements within  normal limits will improve Outcome: Progressing Goal: Diagnostic test results will improve Outcome: Progressing Goal: Respiratory complications will improve Outcome: Progressing Goal: Cardiovascular complication will be avoided Outcome: Progressing   Problem: Nutrition: Goal: Adequate nutrition will be maintained Outcome: Progressing   Problem: Coping: Goal: Level of anxiety will decrease Outcome: Progressing   Problem: Elimination: Goal: Will not experience complications related to bowel motility Outcome: Progressing Goal: Will not experience complications related to urinary retention Outcome: Progressing   Problem: Pain Managment: Goal: General experience of comfort will improve Outcome: Progressing   Problem: Safety: Goal: Ability to remain free from injury will improve Outcome: Progressing   Problem: Skin Integrity: Goal: Risk for impaired skin integrity will decrease Outcome: Progressing

## 2021-04-08 NOTE — Progress Notes (Signed)
Family Medicine Teaching Service Daily Progress Note Intern Pager: [phone]  Patient name: David [name] Medical record number: [id number] Date of birth: 1956-04-30 Age: 65 y.o. Gender: male  Primary Care Provider: Kinnie Feil, MD Consultants: Neurology, Infectious Disease Code Status: Full  Pt Overview and Major Events to Date:  8/7- admitted  Assessment and Plan: Mr. Palos is a 65yo male who presented with altered mental status and sepsis 2/2 COVID-19 infection, now with acute to subacute frontal CVA. PMH significant for CVA (July 2022), falls, HLD, HTN, prediabetes, cocaine abuse.  Acute to Subacute CVA Encephalopathy Mental status somewhat improved today compared to previous--oriented x3.  Dr. Erlinda Hong, neurology, planning for LP this morning.  No noted neurodeficits today.  No meningeal signs on exam today, low suspicion for CNS infection but given presentation, will want to rule this out. Last several COWS have been 1.  - LP today per neuro - Have been holding Lovenox and Plavix, plan to restart 6-12 hours after LP  - TOC for substance abuse counseling - Continue neuro checks - ASA - Continue Telemetry - Can d/c COWS  - TOC for SNF once stabilized  COVID-19  Sepsis Febrile to 101.1 around midday yesterday. Afebrile since. Tachycardia has resolved, now with HR in the 90s. Remains without an elevated white count (WBC 3.4). Blood and urine cx remain with NGTD. Day 3 of total abx, switched from Vanc + cefepime + flagyl to vanc + CTX + amp + acyclovir for meningitis coverage.  -Continue remdesivir (day 2/5) -Continue vanc + ctx + amp + acyclovir pending LP -Follow-up CSF labs to guide future antibiotic choice -Continue to follow blood and urine cultures -Continue Tylenol q6h for fever   Elevated LFTs AST, ALT downtrending a bit today. Still with 2:1 ratio. (90:41) Acute hepatitis panel yesterday neg. Most likely 2/2 alcoholic hepatitis given substance abuse history and family  reports of likely heavy drinking.  - TOC for substance abuse counseling as above   Hypokalemia K 3.1>repleted. -Follow on daily BMP  AKI, resolved Cr 1.75>>>1.19. Now at baseline. -DC IV fluids - PO ad lib, heart healthy diet    HTN BP improved today to 110s/70s.  -Continue amlodipine 5mg  daily  HLD  Prior CVA -Continue atorvastatin 80mg  daily   BPH, chronic, stable -Continue Tamsulosin 0.4mg  daily  FEN/GI: Heart healthy diet PPx: Holding Lovenox for LP Dispo:SNF pending clinical improvement . Barriers include improvement.   Subjective:  Mr. Minta Balsam reports feeling better this morning.  He says he still feels a little feverish/chills.  He is oriented and interactive during interview today, a great improvement from previous.  Objective: Temp:  [98.2 F (36.8 C)-101.1 F (38.4 C)] 98.4 F (36.9 C) (08/09 0346) Pulse Rate:  [84-115] 84 (08/09 0346) Resp:  [16-24] 20 (08/09 0346) BP: (93-157)/(68-109) 117/68 (08/09 0346) SpO2:  [96 %-99 %] 97 % (08/09 0346) Physical Exam: General: Comfortable appearing, lying in bed, intermittently shivering Cardiovascular: Regular rate, regular rhythm, no murmur Respiratory: Clear to auscultation anteriorly, normal work of breathing on room air Abdomen: Soft, nontender, nondistended Extremities: Without edema Neuro: CN II through XII intact, strength 5 out of 5 throughout, sensation intact throughout, no nuchal rigidity, negative Kernig's sign, negative Brudzinski  Laboratory: Recent Labs  Lab 04/06/21 0426 04/07/21 0344 04/08/21 0443  WBC 5.5 4.9 3.4*  HGB 10.6* 12.4* 11.8*  HCT 31.7* 37.0* 35.4*  PLT 268 199 185   Recent Labs  Lab 04/06/21 0426 04/07/21 0344 04/08/21 0443  NA 136 132*  134*  K 4.6 3.9 3.1*  CL 107 99 102  CO2 20* 22 24  BUN 16 12 13   CREATININE 1.32* 1.23 1.19  CALCIUM 8.0* 8.2* 7.8*  PROT 6.5 7.4 6.4*  BILITOT 0.9 0.8 0.7  ALKPHOS 67 68 61  ALT 40 51* 41  AST 78* 105* 90*  GLUCOSE 101* 95 112*     Imaging/Diagnostic Tests: No new imaging, tests  Eppie Gibson, MD 04/08/2021, 6:25 AM PGY-1, [place] Intern pager: [phone], text pages welcome

## 2021-04-09 ENCOUNTER — Other Ambulatory Visit: Payer: Self-pay | Admitting: Cardiology

## 2021-04-09 DIAGNOSIS — I639 Cerebral infarction, unspecified: Secondary | ICD-10-CM

## 2021-04-09 DIAGNOSIS — I6330 Cerebral infarction due to thrombosis of unspecified cerebral artery: Secondary | ICD-10-CM | POA: Diagnosis not present

## 2021-04-09 DIAGNOSIS — R4182 Altered mental status, unspecified: Secondary | ICD-10-CM | POA: Diagnosis not present

## 2021-04-09 DIAGNOSIS — Z8673 Personal history of transient ischemic attack (TIA), and cerebral infarction without residual deficits: Secondary | ICD-10-CM | POA: Diagnosis not present

## 2021-04-09 DIAGNOSIS — U071 COVID-19: Secondary | ICD-10-CM | POA: Diagnosis not present

## 2021-04-09 LAB — CBC WITH DIFFERENTIAL/PLATELET
Abs Immature Granulocytes: 0 10*3/uL (ref 0.00–0.07)
Basophils Absolute: 0 10*3/uL (ref 0.0–0.1)
Basophils Relative: 1 %
Eosinophils Absolute: 0 10*3/uL (ref 0.0–0.5)
Eosinophils Relative: 1 %
HCT: 33.1 % — ABNORMAL LOW (ref 39.0–52.0)
Hemoglobin: 11.3 g/dL — ABNORMAL LOW (ref 13.0–17.0)
Immature Granulocytes: 0 %
Lymphocytes Relative: 49 %
Lymphs Abs: 1.3 10*3/uL (ref 0.7–4.0)
MCH: 28.3 pg (ref 26.0–34.0)
MCHC: 34.1 g/dL (ref 30.0–36.0)
MCV: 83 fL (ref 80.0–100.0)
Monocytes Absolute: 0.2 10*3/uL (ref 0.1–1.0)
Monocytes Relative: 8 %
Neutro Abs: 1.1 10*3/uL — ABNORMAL LOW (ref 1.7–7.7)
Neutrophils Relative %: 41 %
Platelets: 172 10*3/uL (ref 150–400)
RBC: 3.99 MIL/uL — ABNORMAL LOW (ref 4.22–5.81)
RDW: 14.4 % (ref 11.5–15.5)
WBC: 2.7 10*3/uL — ABNORMAL LOW (ref 4.0–10.5)
nRBC: 0 % (ref 0.0–0.2)

## 2021-04-09 LAB — COMPREHENSIVE METABOLIC PANEL
ALT: 33 U/L (ref 0–44)
AST: 77 U/L — ABNORMAL HIGH (ref 15–41)
Albumin: 2.5 g/dL — ABNORMAL LOW (ref 3.5–5.0)
Alkaline Phosphatase: 51 U/L (ref 38–126)
Anion gap: 9 (ref 5–15)
BUN: 13 mg/dL (ref 8–23)
CO2: 23 mmol/L (ref 22–32)
Calcium: 7.9 mg/dL — ABNORMAL LOW (ref 8.9–10.3)
Chloride: 103 mmol/L (ref 98–111)
Creatinine, Ser: 0.99 mg/dL (ref 0.61–1.24)
GFR, Estimated: >60 mL/min (ref >60)
Glucose, Bld: 90 mg/dL (ref 70–99)
Potassium: 3.8 mmol/L (ref 3.5–5.1)
Sodium: 135 mmol/L (ref 135–145)
Total Bilirubin: 0.4 mg/dL (ref 0.3–1.2)
Total Protein: 6.1 g/dL — ABNORMAL LOW (ref 6.5–8.1)

## 2021-04-09 LAB — VDRL, CSF: VDRL Quant, CSF: NONREACTIVE

## 2021-04-09 NOTE — Progress Notes (Signed)
STROKE TEAM PROGRESS NOTE   INTERVAL HISTORY No family at bedside. Patient is sitting in bed for breakfast. He is AAO x 3 today, much improved mentally. No fever, and cough also much less than before. He follows all simple commands and no aphasia, moving all extremities. CSF no evidence of CNS infection. Plavix has been added and lovenox has been resumed for DVT prophylaxis.   Vitals:   04/08/21 2305 04/09/21 0320 04/09/21 0521 04/09/21 0900  BP: 131/78 128/87 112/84 (!) 137/95  Pulse: 86 (!) 55 82 76  Resp: 16 16 17  (!) 22  Temp:   97.6 F (36.4 C)   TempSrc:   Oral   SpO2: 95% 98% 99% 99%  Weight:      Height:       CBC:  Recent Labs  Lab 04/08/21 0443 04/09/21 0329  WBC 3.4* 2.7*  NEUTROABS 1.5* 1.1*  HGB 11.8* 11.3*  HCT 35.4* 33.1*  MCV 84.3 83.0  PLT 185 119   Basic Metabolic Panel:  Recent Labs  Lab 04/08/21 0443 04/09/21 0329  NA 134* 135  K 3.1* 3.8  CL 102 103  CO2 24 23  GLUCOSE 112* 90  BUN 13 13  CREATININE 1.19 0.99  CALCIUM 7.8* 7.9*   Lipid Panel:  Recent Labs  Lab 04/06/21 0800  CHOL 156  TRIG 55  HDL 42  CHOLHDL 3.7  VLDL 11  LDLCALC 103*   HgbA1c:  on 03/14/21: 6.4 Urine Drug Screen:  Recent Labs  Lab 04/05/21 2237  LABOPIA NONE DETECTED  COCAINSCRNUR POSITIVE*  LABBENZ NONE DETECTED  AMPHETMU NONE DETECTED  THCU NONE DETECTED  LABBARB NONE DETECTED    Alcohol Level  Recent Labs  Lab 04/05/21 2238  ETH <10    IMAGING past 24 hours No results found.  PHYSICAL EXAM Physical Exam  Constitutional: Appears well-developed and well-nourished. No fever Eyes: Normal external eye and conjunctiva. HENT: Normocephalic, no lesions, without obvious abnormality.   Musculoskeletal-no joint tenderness, deformity or swelling Cardiovascular: Normal rate and regular rhythm.  Respiratory: Effort normal, non-labored breathing saturations WNL GI: Soft.  No distension. There is no tenderness.  Skin: WDI  Neuro:  patient awake alert,  eyes open, orientated to place, age and time.  No aphasia, however paucity of speech, able to follow all simple commands, able to name and repeat.  Mild dysarthria.  Neck supple, no nuchal rigidity.  Visual fields full, no gaze palsy, facial symmetrical.  Bilateral upper and lower extremity equal strengths, sensation symmetrical, finger-to-nose intact bilaterally.   ASSESSMENT/PLAN David [name] is a 65 y.o.  left handed black male  with current cocaine use, hypertension, dyslipidemia, and recent (02/2021) admission for left mca/pca territory strokes (discharged on aspirin and plavix but per pt report he never took them), who  presented to the hospital for altered mental status. Patient was seen falling by a bystander and EMS was called. On approach by EMS he was confused and had strong smell of urine. He was brought for further evaluation and management.  In the ED he was drowsy and difficult to arouse, but his answers were correct when he did speak. Family was called by ED and his son reports that patient is homeless.   CT head negative for any acute abnormalities. UDS positive for cocaine.CBC with no leukocytosis.  Mild anemia with hemoglobin 10.6.  CMP significant for AKI with creatinine 1.75 and BUN at 28.  COVID positive.  acid elevated at 3.6.      Chest  x-ray negative for any acute abnormalities.  No evidence of pneumonia.  UA significant for small hematuria.  No signs of infection.  MRI brain shows acute to subacute stroke at left corona radiata and right frontal white matter.   Encephalopathy  Fever  COVID + Consistent with encephalopathy, likely related to COVID, fever, stroke, substance abuse CSF WBC 1, RBC 2, protein 21 and glucose 64, crypto antigen neg, HSV PCR pending, VDRL pending Tmax 101.5->101.1-> afebrile COVID + on remedesivir  On vancomycin, Rocephin and acyclovir for neuro coverage -> no evidence of CNS infection now -> ID on board for further Abx adjustement  Stroke:  left  corona radiata and right frontal white matter infarct embolic secondary to synchronized small vessel disease source given risk factors, continued cocaine use and noncompliance with meds CT head No acute abnormality noted. MRI Acute to subacute small vessel infarcts in the left corona radiata and right frontal white matter. MRA  No emergent finding or convincing change from a CTA 3 weeks ago. 2D Echo EF 65 to 70%.  LDL 103 HgbA1c 6.4 UDS + cocaine VTE prophylaxis - SCDs now given LP today aspirin 81 mg daily and clopidogrel 75 mg daily (not taking) prior to admission, now on aspirin 81 mg daily and plavix 75mg  DAPT for 3 weeks and then ASA alone.  Therapy recommendations:  SNF Disposition:  pending  Hx of stroke 07/2017 with left BG/CR acute infarct, old left pontine and right caudate infarct.  MRA and carotid Doppler negative.  A1c 6.2, LDL 114, UDS positive for cocaine and THC, EF 60 to 65%.  Discharged on aspirin and Lipitor, with residual mild left-sided weakness. 02/2021 he was admitted again for acute stroke at left CR and left MCA/ACA border zone.  CTA head and neck left A2 stenosis, possible BA moderate stenosis.  EF 55 to 70%, LDL 133, A1c 6.4.  UDS negative at the time.  Discharged with DAPT and Lipitor 40.  Cocaine abuse UDS positive for cocaine Cocaine cessation education provided Patient is willing to quit  Hypertension Home meds:  none Stable Long-term BP goal normotensive  Hyperlipidemia Home meds:  lipitor 80 mg, resumed in hospital LDL 103, goal < 70 Continue statin at discharge  Other Stroke Risk Factors Advanced Age >/= 61   Other Active Problems Homeless Leukopenia WBC 3.4->2.7 - recommend to check HIV again Hypokalemia K 3.1->3.8 Elevated LFTs AST/ALT 105/51-> 90/41->77/33, acute hepatitis panel negative  Hospital day # 4   Rosalin Hawking, MD PhD Stroke Neurology 04/09/2021 10:38 AM   To contact Stroke Continuity provider, please refer to  [email or web address] After hours, contact General Neurology

## 2021-04-09 NOTE — Progress Notes (Signed)
Physical Therapy Treatment Patient Details Name: David [name] MRN: [id number] DOB: 03-12-1956 Today's Date: 04/09/2021    History of Present Illness Pt is a 65 y.o. male who presented 04/05/21 with AMS and pt falling. MRI revealed acute to subacute small vessel infarcts in the left corona  radiata and right frontal white matter. UDS positive for cocaine. Pt with AKI and sepsis 2/2 COVID infection. Patient was recently admitted from 03/13/21-03/15/21 for new CVA. PMH: CVA, falls, hyperlipidemia, hypertension, prediabetes, BPH, substance use disorder.    PT Comments    Patient progressing towards physical therapy goals. Patient required min-modA for mobility with RW this session. Patient with SOB throughout session with spO2 >95% on RA. Patient with complaints of fatigue following second bout of in room ambulation. Continue to recommend SNF for ongoing Physical Therapy.       Follow Up Recommendations  SNF;Supervision/Assistance - 24 hour     Equipment Recommendations  Other (comment) (TBD)    Recommendations for Other Services       Precautions / Restrictions Precautions Precautions: Fall Precaution Comments: COVID+ Restrictions Weight Bearing Restrictions: No    Mobility  Bed Mobility Overal bed mobility: Needs Assistance Bed Mobility: Supine to Sit;Sit to Supine     Supine to sit: Min assist Sit to supine: Min guard   General bed mobility comments: assist for trunk elevation and repositioning hips towards EOB    Transfers Overall transfer level: Needs assistance Equipment used: Rolling Adelynne Joerger (2 wheeled) Transfers: Sit to/from Stand Sit to Stand: Mod assist Stand pivot transfers: Min assist       General transfer comment: modA to rise and minA to steady upon standing. SOB noted with spO2 >95% throughout  Ambulation/Gait Ambulation/Gait assistance: Min assist Gait Distance (Feet): 20 Feet (x12) Assistive device: Rolling Catheryne Deford (2 wheeled) Gait Pattern/deviations:  Decreased stride length;Decreased dorsiflexion - right;Decreased weight shift to right;Trunk flexed;Narrow base of support Gait velocity: decreased   General Gait Details: ambulated in room with RW. Increased difficulty during turns requiring assist for balance. Cues for upright posture and taking his time on turns. Seated rest break in between bouts with SOB noted. Patient with complaints of fatigue following second bout   Stairs             Wheelchair Mobility    Modified Rankin (Stroke Patients Only) Modified Rankin (Stroke Patients Only) Pre-Morbid Rankin Score: Slight disability Modified Rankin: Moderately severe disability     Balance Overall balance assessment: Needs assistance Sitting-balance support: Single extremity supported Sitting balance-Leahy Scale: Fair Sitting balance - Comments: required minA for balance during dynamic ADL task seated EOB Postural control: Posterior lean Standing balance support: Bilateral upper extremity supported Standing balance-Leahy Scale: Poor Standing balance comment: reliant UE support and external assist                            Cognition Arousal/Alertness: Awake/alert Behavior During Therapy: Flat affect Overall Cognitive Status: Impaired/Different from baseline Area of Impairment: Attention;Following commands;Safety/judgement;Awareness;Problem solving                   Current Attention Level: Selective   Following Commands: Follows one step commands with increased time Safety/Judgement: Decreased awareness of safety;Decreased awareness of deficits Awareness: Intellectual Problem Solving: Slow processing;Decreased initiation;Difficulty sequencing;Requires verbal cues;Requires tactile cues General Comments: delayed responses and difficulty problem solving at times during session      Exercises      General Comments  Pertinent Vitals/Pain Pain Assessment: No/denies pain Pain  Intervention(s): Monitored during session    Home Living                      Prior Function            PT Goals (current goals can now be found in the care plan section) Acute Rehab PT Goals Patient Stated Goal: not stated PT Goal Formulation: With patient Time For Goal Achievement: 04/20/21 Potential to Achieve Goals: Good Progress towards PT goals: Progressing toward goals    Frequency    Min 3X/week      PT Plan Current plan remains appropriate    Co-evaluation   Reason for Co-Treatment: Complexity of the patient's impairments (multi-system involvement);For patient/therapist safety;To address functional/ADL transfers   OT goals addressed during session: ADL's and self-care      AM-PAC PT "6 Clicks" Mobility   Outcome Measure  Help needed turning from your back to your side while in a flat bed without using bedrails?: A Little Help needed moving from lying on your back to sitting on the side of a flat bed without using bedrails?: A Little Help needed moving to and from a bed to a chair (including a wheelchair)?: A Little Help needed standing up from a chair using your arms (e.g., wheelchair or bedside chair)?: A Lot Help needed to walk in hospital room?: A Little Help needed climbing 3-5 steps with a railing? : A Little 6 Click Score: 17    End of Session Equipment Utilized During Treatment: Gait belt Activity Tolerance: Patient limited by fatigue Patient left: in bed;with call bell/phone within reach;with bed alarm set Nurse Communication: Mobility status PT Visit Diagnosis: Unsteadiness on feet (R26.81);Repeated falls (R29.6);History of falling (Z91.81);Muscle weakness (generalized) (M62.81);Hemiplegia and hemiparesis;Difficulty in walking, not elsewhere classified (R26.2);Other abnormalities of gait and mobility (R26.89);Other symptoms and signs involving the nervous system (R29.898);Apraxia (R48.2) Hemiplegia - Right/Left: Right Hemiplegia -  dominant/non-dominant: Dominant Hemiplegia - caused by: Cerebral infarction     Time: 6301-6010 PT Time Calculation (min) (ACUTE ONLY): 25 min  Charges:  $Gait Training: 8-22 mins                     Ernestina Joe A. Gilford Rile PT, DPT Acute Rehabilitation Services Pager [phone] Office [phone]    Linna Hoff 04/09/2021, 3:50 PM

## 2021-04-09 NOTE — Progress Notes (Signed)
Occupational Therapy Treatment Patient Details Name: David [name] MRN: [id number] DOB: [birth year] Today's Date: 04/09/2021    History of present illness Pt is a 65 y.o. male who presented 04/05/21 with AMS and pt falling. MRI revealed acute to subacute small vessel infarcts in the left corona  radiata and right frontal white matter. UDS positive for cocaine. Pt with AKI and sepsis 2/2 COVID infection. Patient was recently admitted from 03/13/21-03/15/21 for new CVA. PMH: CVA, falls, hyperlipidemia, hypertension, prediabetes, BPH, substance use disorder.   OT comments  Patient with +1 support this session.  Able to walk to the sink, stand and preform grooming task with up to min guard for static stand.  With dynamic mobility, he needed RW and up to [place] for balance support.  Patient fatiguing quickly, but O2 sats remained in the upper 90% on RA. Up to Mod A for second sit to stand.  OT to progress ADL status given mobility has improved.  Continue to recommend SNF post acute to maximize his functional independence.    Follow Up Recommendations  SNF;Supervision/Assistance - 24 hour    Equipment Recommendations       Recommendations for Other Services      Precautions / Restrictions Precautions Precautions: Fall Precaution Comments: COVID+ Restrictions Weight Bearing Restrictions: No       Mobility Bed Mobility Overal bed mobility: Needs Assistance Bed Mobility: Supine to Sit;Sit to Supine     Supine to sit: Min assist Sit to supine: Min guard     Patient Response: Flat affect  Transfers Overall transfer level: Needs assistance Equipment used: Rolling walker (2 wheeled) Transfers: Sit to/from Stand Sit to Stand: Mod assist Stand pivot transfers: Min assist       General transfer comment: up to [place] a for balance at times.  able to stand at sink for 5 min brushing teeth.  SOB noted, and c/o fatigue    Balance Overall balance assessment: Needs assistance Sitting-balance  support: Single extremity supported Sitting balance-Leahy Scale: Fair Sitting balance - Comments: Min Guard trying to place socks.  MOd A to don sock R foot, but able to place with min Guard on L foot Postural control: Posterior lean Standing balance support: Bilateral upper extremity supported Standing balance-Leahy Scale: Poor Standing balance comment: Reliant on RW and balance support                           ADL either performed or assessed with clinical judgement   ADL Overall ADL's : Needs assistance/impaired     Grooming: Oral care;Min guard;Standing                               Functional mobility during ADLs: Minimal assistance;Moderate assistance;Rolling walker                         Cognition Arousal/Alertness: Awake/alert Behavior During Therapy: Flat affect Overall Cognitive Status: Impaired/Different from baseline                                 General Comments: patient with noted delayed responses, but more appropriate this date.                          Pertinent Vitals/ Pain  Pain Assessment: No/denies pain Pain Intervention(s): Monitored during session                                                          Frequency  Min 2X/week        Progress Toward Goals  OT Goals(current goals can now be found in the care plan section)  Progress towards OT goals: Progressing toward goals  Acute Rehab OT Goals Patient Stated Goal: not stated OT Goal Formulation: With patient Time For Goal Achievement: 04/20/21 Potential to Achieve Goals: Good  Plan Discharge plan remains appropriate    Co-evaluation    PT/OT/SLP Co-Evaluation/Treatment: Yes Reason for Co-Treatment: Complexity of the patient's impairments (multi-system involvement);For patient/therapist safety;To address functional/ADL transfers   OT goals addressed during session: ADL's and self-care       AM-PAC OT "6 Clicks" Daily Activity     Outcome Measure   Help from another person eating meals?: A Little Help from another person taking care of personal grooming?: A Little Help from another person toileting, which includes using toliet, bedpan, or urinal?: A Lot Help from another person bathing (including washing, rinsing, drying)?: A Lot Help from another person to put on and taking off regular upper body clothing?: A Little Help from another person to put on and taking off regular lower body clothing?: A Lot 6 Click Score: 15    End of Session Equipment Utilized During Treatment: Rolling walker  OT Visit Diagnosis: Unsteadiness on feet (R26.81);Repeated falls (R29.6);Muscle weakness (generalized) (M62.81);Other symptoms and signs involving cognitive function;Hemiplegia and hemiparesis   Activity Tolerance Patient tolerated treatment well   Patient Left in bed;with call bell/phone within reach   Nurse Communication          Time: 6553-7482 OT Time Calculation (min): 30 min  Charges: OT General Charges $OT Visit: 1 Visit OT Treatments $Self Care/Home Management : 8-22 mins  04/09/2021  Rich, OTR/L  Acute Rehabilitation Services  Office:  [place] 04/09/2021, 3:03 PM

## 2021-04-09 NOTE — Progress Notes (Signed)
Family Medicine Teaching Service Daily Progress Note Intern Pager: [phone]  Patient name: David [name] Medical record number: [id number] Date of birth: [birth year] Age: 65 y.o. Gender: male  Primary Care Provider: Kinnie Feil, MD Consultants: Neuro, ID Code Status: Full  Pt Overview and Major Events to Date:  8/7- admitted  Assessment and Plan: David [name] is a 65yo male who presented with altered mental status and sepsis 2/2 COVID-19 infection, now with ?acute to subacute frontal CVA. PMH significant for CVA (July 2022), falls, HLD, HTN, prediabetes, cocaine abuse  Encephalopathy Acute to Subacute CVA Mental status improved today. Tolerated LP yesterday without issue. Initial CSF labs negative, normal glucose, no bacteria on gram stain, and neg Crypto Ag. Very unlikely to be CNS infection at this point. As of now, it seems that the most likely cause of his encephalopathy was his COVID infection.  - Defer to ID/Neuro on when to d/c abx - F/u HSV, VDRL, CSF culture - Restarted Lovenox and Plavix this am after holding for LP - TOC for substance abuse counseling - Can d/c neuro checks and COWS as both have been negative - TOC for SNF - Can d/c cardiac monitoring  COVID-19  Sepsis Afebrile >24 hours. HR in 80s. No elevated white count, (WBC actually low at 2.7). Though note that procalcitonin actually increased slightly yesterday (0.19>0.50). Blood and urine cx with NGTD.  - Continue remdesivir (day 4/5)  - Continue to follow cultures  HTN BPs 100s-110s/70s-80s. - Continue amlodipine 5mg  daily  HLD -Continue atorvastatin 80mg  daily  BPH, chronic, stable - Continue Tamsulosin 0.4mg  daily   FEN/GI: Heart Healthy diet PPx: Lovenox Dispo:SNF in 2-3 days. Barriers include placement .   Subjective:  David [name] has no complaints today.  He denies any chest pain, shortness of breath, pain anywhere.  Objective: Temp:  [97.6 F (36.4 C)-97.9 F (36.6 C)] 97.6 F (36.4 C)  (08/10 0521) Pulse Rate:  [55-94] 76 (08/10 0900) Resp:  [16-22] 22 (08/10 0900) BP: (110-137)/(71-95) 137/95 (08/10 0900) SpO2:  [95 %-99 %] 99 % (08/10 0900) Physical Exam: General: NAD, resting in bed Cardiovascular: Regular rate, regular rhythm, no murmur Respiratory: Clear to auscultation bilaterally, normal work of breathing on room air Abdomen: Soft, nontender, nondistended Extremities: Without edema  Laboratory: Recent Labs  Lab 04/07/21 0344 04/08/21 0443 04/09/21 0329  WBC 4.9 3.4* 2.7*  HGB 12.4* 11.8* 11.3*  HCT 37.0* 35.4* 33.1*  PLT 199 185 172   Recent Labs  Lab 04/07/21 0344 04/08/21 0443 04/09/21 0329  NA 132* 134* 135  K 3.9 3.1* 3.8  CL 99 102 103  CO2 22 24 23   BUN 12 13 13   CREATININE 1.23 1.19 0.99  CALCIUM 8.2* 7.8* 7.9*  PROT 7.4 6.4* 6.1*  BILITOT 0.8 0.7 0.4  ALKPHOS 68 61 51  ALT 51* 41 33  AST 105* 90* 77*  GLUCOSE 95 112* 90    Imaging/Diagnostic Tests: CSF clear with glucose 64, 1 WBC, no bacteria on gram stain  Eppie Gibson, MD 04/09/2021, 9:16 AM PGY-1, [facility] Intern pager: [phone], text pages welcome

## 2021-04-09 NOTE — Progress Notes (Signed)
FPTS Interim Night Progress Note  S:Patient sleeping comfortably.  Rounded with primary night RN. Reports no changes in mentation. No concerns voiced.    O: Today's Vitals   04/08/21 1551 04/08/21 2000 04/08/21 2120 04/08/21 2305  BP: 110/71  125/78 131/78  Pulse: 94  88 86  Resp: 20   16  Temp: 97.9 F (36.6 C)     TempSrc: Oral     SpO2: 96%  98% 95%  Weight:      Height:      PainSc:  0-No pain        A/P: LR 100/hr ordered to maintain hydration while on IV Acyclovir Continue to monitor   Carollee Leitz MD PGY-3, [place] Medicine Service pager [phone]

## 2021-04-09 NOTE — Hospital Course (Addendum)
David [name] is a 65yo male who presented with altered mental status and sepsis 2/2 COVID-19 infection, now with ?acute to subacute frontal CVA. PMH significant for CVA (July 2022), falls, HLD, HTN, prediabetes, cocaine abuse  Acute to Subacute CVA Encephalopathy David [name] presented to the [facility] on 8/6 after a witnessed fall in the community. Per EMS, he had a fever to 106.6, though rectal temp in the ED was 102.9. On arrival he was somnolent, only intermittently following commands and had rigors. CT Head showed no acute abnormalities but MRI brain raised concern for acute to subacute small vessel infarcts in the left corona radiata and right frontal white matter which neurology suspected might represent new vs. evolving strokes but would not explain his altered mental status. Follow-up MRA however showed changes consistent with his prior stroke from July 2022. Neuro exam remained non-focal and neurology only recommended further management with DAPT and cardiac monitoring.  On hospital day 2 he was increasingly obtunded, only responding to painful stimuli. Given his high fever and acutely worsened mental status, neurology was concerned for CNS infection. We consulted Infectious Diseases for guidance regarding antimicrobial coverage until we could obtain an LP. We started him on ampicillin, vancomycin, ceftriaxone, and acyclovir.  CSF labs and culture were not concerning for CNS infection and antibiotic coverage was discontinued on 8/10. His HSV resulted on 8/11 and acyclovir was also discontinued.   COVID +  He was septic on admission and found to be COVID+, though he never required supplemental O2. We initiated treatment with Remdesivir which he tolerated well. By hospital day 3, his mental status was much improved. He never required supplemental O2. He finished his course of Remdesivir on 8/11 and COVID precautions were discontinued on 8/12.  Patient has no longer had symptoms of COVID-19.  He is asymptomatic  and outside of his window.   Elevated LFTs He was noted to have a slight increase in his AST and ALT (105, 51 respectively). His alk phos and bili were normal. We suspected that this was due to alcoholic hepatitis due to his extensive etOH abuse, but wanted to rule out an acute viral process. Acute hepatitis panel was negative. On chart review, this was a new issue as he had normal LFTs upon presentation for his stroke in July. At that time, his Lipitor dose was increased from 40-50m daily. We considered this as a potential cause of his transaminitis and switched him to Crestor 4102mdaily.  After the switch to Crestor his LFTs decreased to normal levels and are stable.  COVID-negative on day of discharge.  PCP Follow-up Recommendations 1) We started him on amlodipine 44m70maily for blood pressure control.   2) Patient appeared depressed throughout this hospitalization. Discussion with his son revealed that this may be longstanding depression compounded by recent stroke. We started him on escitalopram on 8/12.  Creased his Lexapro amount while in the hospital.  3) He will be on DAPT until August 31st, at that time he can cut back to aspirin alone.  4) He is going out with a 30-day cardiac event monitor. Dr. CroSallyanne Kusterll follow-up on this.   5) We changed his atorvastatin to Crestor due to his transaminitis. His LFTs rapidly improved after making this change.   6) Patient will need CBC to recheck WBC count in one week due to neutropenia.   7) Recommend outpatient gastroenterology consultation for proctitis on CT---thought to be due to acute viral etiology vs. Irritation---consider Colonoscopy

## 2021-04-09 NOTE — Progress Notes (Signed)
    [facility] asked to place cardiac monitor in the setting of CVA. Will be read by Dr. Sallyanne Kuster (DOD), follow up arranged.

## 2021-04-09 NOTE — Progress Notes (Signed)
[facility] Infectious Diseases Follow Up Note  Patient Identification: Patient Name: David [name] MRN: [id number] [place] Date: 04/05/2021  8:07 PM Age: 65 y.o.Today's Date: 04/09/2021   Reason for Visit: Concern for meningitis  Active Problems:   Altered mental status   COVID-19   Cerebral thrombosis with cerebral infarction  Antibiotics: Vancomycin 8/6- current                    Cefepime 8/6-8/8, ceftriaxone 8/8- current                      Ampicillin 8/8-current                     Metronidazole 8/6-current  Acyclovir 8/8 -8/9  Interval Events: Continues to be afebrile, no leukocytosis, patient underwent LP with neurology yesterday.  CSF analysis reviewed  Assessment ??  Meningitis -CSF analysis is not suggestive of meningitis, gram stain with no organisms, CSF culture with no growth in 24 hours -Patient is awake alert and oriented to time place and person, able to follow commands  SARS-CoV-2 positive: Chest x-ray with no pneumonia, remdesivir Acute/subacute CVA in the left corona radiator and right frontal white matter-neurology following Cocaine abuse   Recommendations DC ampicillin, vancomycin and ceftriaxone CSF analysis with gram stain is not suggestive of CNS infection and patient is awake and oriented x3 Low suspicion for HSV encephalitis at this time but reasonable to continue IV acyclovir pending HSV-1/HSV-2 PCR.  Can DC acyclovir if HSV 1/HSV-2 PCR is negative COVID management per prior primary.  COVID isolation precautions per IP CVA management per neurology ID will sign off.  Please call us with questions.  Plan discussed with patient's RN/ID pharmacy and Primary  Rest of the management as per the primary team. Thank you for the consult. Please page with pertinent questions or concerns.  ______________________________________________________________________ Subjective patient seen and examined at the  bedside.  He is lying in bed.  He is able to tell me his name, month and year.  He is able to tell me the name of the president and able to follow commands.  He is unsure about the reason for his hospital admission though.    Vitals BP (!) 148/85 (BP Location: Right Arm)   Pulse 89   Temp 97.6 F (36.4 C) (Oral)   Resp (!) 22   Ht 5\' 10"  (1.778 m)   Wt 68 kg   SpO2 100%   BMI 21.51 kg/m     Physical Exam Constitutional: Not in acute distress, lying in bed and looks comfortable    Comments:   Cardiovascular:     Rate and Rhythm: Normal rate and regular rhythm.     Heart sounds:   Pulmonary:     Effort: Pulmonary effort is normal.     Comments: Room air  Abdominal:     Palpations: Abdomen is soft.     Tenderness: Nontender and nondistended  Musculoskeletal:        General: No swelling or tenderness.   Skin:    Comments: No lesions or rashes  Neurological:     General: No focal deficit present.   Psychiatric:        Mood and Affect: Mood normal. Calm and cooperative   Pertinent Microbiology Results for orders placed or performed during the hospital encounter of 04/05/21  Blood culture (routine x 2)     Status: None (Preliminary result)   Collection Time: 04/05/21  8:15 PM   Specimen: BLOOD RIGHT ARM  Result Value Ref Range Status   Specimen Description BLOOD RIGHT ARM  Final   Special Requests   Final    BOTTLES DRAWN AEROBIC AND ANAEROBIC Blood Culture adequate volume   Culture   Final    NO GROWTH 4 DAYS Performed at [facility], [address], [place], [place] [address]    Report Status PENDING  Incomplete  Resp Panel by RT-PCR (Flu A&B, Covid) Nasopharyngeal Swab     Status: Abnormal   Collection Time: 04/05/21  8:26 PM   Specimen: Nasopharyngeal Swab; Nasopharyngeal(NP) swabs in vial transport medium  Result Value Ref Range Status   SARS Coronavirus 2 by RT PCR POSITIVE (A) NEGATIVE Final    Comment: RESULT CALLED TO, READ BACK BY AND VERIFIED  WITH: K RUSSY,RN@[id number]  04/05/21 [place] (NOTE) SARS-CoV-2 target nucleic acids are DETECTED.  The SARS-CoV-2 RNA is generally detectable in upper respiratory specimens during the acute phase of infection. Positive results are indicative of the presence of the identified virus, but do not rule out bacterial infection or co-infection with other pathogens not detected by the test. Clinical correlation with patient history and other diagnostic information is necessary to determine patient infection status. The expected result is Negative.  Fact Sheet for Patients: EntrepreneurPulse.com.au  Fact Sheet for Healthcare Providers: IncredibleEmployment.be  This test is not yet approved or cleared by the [place] FDA and  has been authorized for detection and/or diagnosis of SARS-CoV-2 by FDA under an Emergency Use Authorization (EUA).  This EUA will remain in effect (meaning this test can be used)  for the duration of  the COVID-19 declaration under Section 564(b)(1) of the Act, 21 U.S.C. section 360bbb-3(b)(1), unless the authorization is terminated or revoked sooner.     Influenza A by PCR NEGATIVE NEGATIVE Final   Influenza B by PCR NEGATIVE NEGATIVE Final    Comment: (NOTE) The Xpert Xpress SARS-CoV-2/FLU/RSV plus assay is intended as an aid in the diagnosis of influenza from Nasopharyngeal swab specimens and should not be used as a sole basis for treatment. Nasal washings and aspirates are unacceptable for Xpert Xpress SARS-CoV-2/FLU/RSV testing.  Fact Sheet for Patients: EntrepreneurPulse.com.au  Fact Sheet for Healthcare Providers: IncredibleEmployment.be  This test is not yet approved or cleared by the [place] FDA and has been authorized for detection and/or diagnosis of SARS-CoV-2 by FDA under an Emergency Use Authorization (EUA). This EUA will remain in effect (meaning this test can be used) for  the duration of the COVID-19 declaration under Section 564(b)(1) of the Act, 21 U.S.C. section 360bbb-3(b)(1), unless the authorization is terminated or revoked.  Performed at [facility], [place] [address] [place], [place] [address]   Blood culture (routine x 2)     Status: None (Preliminary result)   Collection Time: 04/05/21  8:41 PM   Specimen: BLOOD  Result Value Ref Range Status   Specimen Description BLOOD RIGHT ANTECUBITAL  Final   Special Requests   Final    BOTTLES DRAWN AEROBIC AND ANAEROBIC Blood Culture adequate volume   Culture   Final    NO GROWTH 4 DAYS Performed at [facility], [place] [address] [place], [place] [address]    Report Status PENDING  Incomplete  Urine Culture     Status: None   Collection Time: 04/05/21 10:18 PM   Specimen: In/Out Cath Urine  Result Value Ref Range Status   Specimen Description IN/OUT CATH URINE  Final   Special Requests NONE  Final   Culture   Final    NO GROWTH Performed at [facility], [place] [address], [place], [place] [address]    Report Status 04/07/2021 FINAL  Final  MRSA Next Gen by PCR, Nasal     Status: None   Collection Time: 04/06/21  8:51 AM   Specimen: Nasal Mucosa; Nasal Swab  Result Value Ref Range Status   MRSA by PCR Next Gen NOT DETECTED NOT DETECTED Final    Comment: (NOTE) The GeneXpert MRSA Assay (FDA approved for NASAL specimens only), is one component of a comprehensive MRSA colonization surveillance program. It is not intended to diagnose MRSA infection nor to guide or monitor treatment for MRSA infections. Test performance is not FDA approved in patients less than 81 years old. Performed at [facility], [place] [address], [place], [place] [address]   CSF culture w Gram Stain     Status: None (Preliminary result)   Collection Time: 04/08/21  2:15 PM   Specimen: CSF; Cerebrospinal Fluid  Result Value Ref Range Status   Specimen Description CSF  Final   Special Requests NONE   Final   Gram Stain   Final    WBC PRESENT, PREDOMINANTLY MONONUCLEAR NO ORGANISMS SEEN CYTOSPIN SMEAR    Culture   Final    NO GROWTH < 24 HOURS Performed at [facility], [place] [address] [place], [place] [address]    Report Status PENDING  Incomplete    Pertinent Lab. CBC Latest Ref Rng & Units 04/09/2021 04/08/2021 04/07/2021  WBC 4.0 - 10.5 K/uL 2.7(L) 3.4(L) 4.9  Hemoglobin 13.0 - 17.0 g/dL 11.3(L) 11.8(L) 12.4(L)  Hematocrit 39.0 - 52.0 % 33.1(L) 35.4(L) 37.0(L)  Platelets 150 - 400 K/uL 172 185 199   CMP Latest Ref Rng & Units 04/09/2021 04/08/2021 04/07/2021  Glucose 70 - 99 mg/dL 90 112(H) 95  BUN 8 - 23 mg/dL 13 13 12   Creatinine 0.61 - 1.24 mg/dL 0.99 1.19 1.23  Sodium 135 - 145 mmol/L 135 134(L) 132(L)  Potassium 3.5 - 5.1 mmol/L 3.8 3.1(L) 3.9  Chloride 98 - 111 mmol/L 103 102 99  CO2 22 - 32 mmol/L 23 24 22   Calcium 8.9 - 10.3 mg/dL 7.9(L) 7.8(L) 8.2(L)  Total Protein 6.5 - 8.1 g/dL 6.1(L) 6.4(L) 7.4  Total Bilirubin 0.3 - 1.2 mg/dL 0.4 0.7 0.8  Alkaline Phos 38 - 126 U/L 51 61 68  AST 15 - 41 U/L 77(H) 90(H) 105(H)  ALT 0 - 44 U/L 33 41 51(H)     Pertinent Imaging today Plain films and CT images have been personally visualized and interpreted; radiology reports have been reviewed. Decision making incorporated into the Impression / Recommendations.  I spent more than 35 minutes for this patient encounter including review of prior medical records, coordination of care  with greater than 50% of time being face to face/counseling and discussing diagnostics/treatment plan with the patient/family.  Electronically signed by:   Rosiland Oz, MD Infectious Disease Physician [facility] Pager: [phone]

## 2021-04-10 ENCOUNTER — Encounter (HOSPITAL_COMMUNITY): Payer: Self-pay | Admitting: Family Medicine

## 2021-04-10 DIAGNOSIS — Z5900 Homelessness unspecified: Secondary | ICD-10-CM

## 2021-04-10 DIAGNOSIS — R7989 Other specified abnormal findings of blood chemistry: Secondary | ICD-10-CM

## 2021-04-10 DIAGNOSIS — R4182 Altered mental status, unspecified: Secondary | ICD-10-CM | POA: Diagnosis not present

## 2021-04-10 DIAGNOSIS — I6330 Cerebral infarction due to thrombosis of unspecified cerebral artery: Secondary | ICD-10-CM | POA: Diagnosis not present

## 2021-04-10 DIAGNOSIS — F1410 Cocaine abuse, uncomplicated: Secondary | ICD-10-CM | POA: Diagnosis not present

## 2021-04-10 DIAGNOSIS — G9340 Encephalopathy, unspecified: Secondary | ICD-10-CM

## 2021-04-10 DIAGNOSIS — U071 COVID-19: Secondary | ICD-10-CM | POA: Diagnosis not present

## 2021-04-10 LAB — CBC WITH DIFFERENTIAL/PLATELET
Abs Immature Granulocytes: 0 10*3/uL (ref 0.00–0.07)
Basophils Absolute: 0 10*3/uL (ref 0.0–0.1)
Basophils Relative: 1 %
Eosinophils Absolute: 0 10*3/uL (ref 0.0–0.5)
Eosinophils Relative: 1 %
HCT: 35.4 % — ABNORMAL LOW (ref 39.0–52.0)
Hemoglobin: 11.6 g/dL — ABNORMAL LOW (ref 13.0–17.0)
Immature Granulocytes: 0 %
Lymphocytes Relative: 46 %
Lymphs Abs: 1.5 10*3/uL (ref 0.7–4.0)
MCH: 27.8 pg (ref 26.0–34.0)
MCHC: 32.8 g/dL (ref 30.0–36.0)
MCV: 84.9 fL (ref 80.0–100.0)
Monocytes Absolute: 0.2 10*3/uL (ref 0.1–1.0)
Monocytes Relative: 6 %
Neutro Abs: 1.5 10*3/uL — ABNORMAL LOW (ref 1.7–7.7)
Neutrophils Relative %: 46 %
Platelets: 178 10*3/uL (ref 150–400)
RBC: 4.17 MIL/uL — ABNORMAL LOW (ref 4.22–5.81)
RDW: 14.2 % (ref 11.5–15.5)
WBC: 3.3 10*3/uL — ABNORMAL LOW (ref 4.0–10.5)
nRBC: 0 % (ref 0.0–0.2)

## 2021-04-10 LAB — COMPREHENSIVE METABOLIC PANEL
ALT: 49 U/L — ABNORMAL HIGH (ref 0–44)
AST: 102 U/L — ABNORMAL HIGH (ref 15–41)
Albumin: 2.9 g/dL — ABNORMAL LOW (ref 3.5–5.0)
Alkaline Phosphatase: 63 U/L (ref 38–126)
Anion gap: 8 (ref 5–15)
BUN: 13 mg/dL (ref 8–23)
CO2: 23 mmol/L (ref 22–32)
Calcium: 8.2 mg/dL — ABNORMAL LOW (ref 8.9–10.3)
Chloride: 105 mmol/L (ref 98–111)
Creatinine, Ser: 1.04 mg/dL (ref 0.61–1.24)
GFR, Estimated: >60 mL/min (ref >60)
Glucose, Bld: 99 mg/dL (ref 70–99)
Potassium: 3.9 mmol/L (ref 3.5–5.1)
Sodium: 136 mmol/L (ref 135–145)
Total Bilirubin: 0.5 mg/dL (ref 0.3–1.2)
Total Protein: 6.6 g/dL (ref 6.5–8.1)

## 2021-04-10 LAB — RAPID HIV SCREEN (HIV 1/2 AB+AG)
HIV 1/2 Antibodies: NONREACTIVE
HIV-1 P24 Antigen - HIV24: NONREACTIVE

## 2021-04-10 LAB — CULTURE, BLOOD (ROUTINE X 2)
Culture: NO GROWTH
Culture: NO GROWTH
Special Requests: ADEQUATE
Special Requests: ADEQUATE

## 2021-04-10 LAB — HSV 1/2 PCR, CSF
HSV-1 DNA: NEGATIVE
HSV-2 DNA: NEGATIVE

## 2021-04-10 NOTE — Progress Notes (Signed)
STROKE TEAM PROGRESS NOTE   INTERVAL HISTORY No family at bedside. No acute event overnight, antibiotics have been discontinued given CSF resolved.  However, ID would like to continue acyclovir until HSV PCR come back negative.  Vitals:   04/10/21 0348 04/10/21 0700 04/10/21 0819 04/10/21 1224  BP: 137/86 (!) 105/57 (!) 128/96 (!) 131/93  Pulse: 79 90 66 72  Resp: 16 14 19 18   Temp: 98.2 F (36.8 C) 98 F (36.7 C) 98.3 F (36.8 C) 98.3 F (36.8 C)  TempSrc: Oral Oral Oral Oral  SpO2: 100% 100% 100% 100%  Weight:      Height:       CBC:  Recent Labs  Lab 04/09/21 0329 04/10/21 0155  WBC 2.7* 3.3*  NEUTROABS 1.1* 1.5*  HGB 11.3* 11.6*  HCT 33.1* 35.4*  MCV 83.0 84.9  PLT 172 295   Basic Metabolic Panel:  Recent Labs  Lab 04/09/21 0329 04/10/21 0155  NA 135 136  K 3.8 3.9  CL 103 105  CO2 23 23  GLUCOSE 90 99  BUN 13 13  CREATININE 0.99 1.04  CALCIUM 7.9* 8.2*   Lipid Panel:  Recent Labs  Lab 04/06/21 0800  CHOL 156  TRIG 55  HDL 42  CHOLHDL 3.7  VLDL 11  LDLCALC 103*   HgbA1c:  on 03/14/21: 6.4 Urine Drug Screen:  Recent Labs  Lab 04/05/21 2237  LABOPIA NONE DETECTED  COCAINSCRNUR POSITIVE*  LABBENZ NONE DETECTED  AMPHETMU NONE DETECTED  THCU NONE DETECTED  LABBARB NONE DETECTED    Alcohol Level  Recent Labs  Lab 04/05/21 2238  ETH <10    IMAGING past 24 hours No results found.  PHYSICAL EXAM Physical Exam  Constitutional: Appears well-developed and well-nourished. No fever Eyes: Normal external eye and conjunctiva. HENT: Normocephalic, no lesions, without obvious abnormality.   Musculoskeletal-no joint tenderness, deformity or swelling Cardiovascular: Normal rate and regular rhythm.  Respiratory: Effort normal, non-labored breathing saturations WNL GI: Soft.  No distension. There is no tenderness.  Skin: WDI  Neuro:  patient awake alert, eyes open, orientated to place, age and time.  No aphasia, however paucity of speech, able  to follow all simple commands, able to name and repeat.  Mild dysarthria.  Neck supple, no nuchal rigidity.  Visual fields full, no gaze palsy, facial symmetrical.  Bilateral upper and lower extremity equal strengths, sensation symmetrical, finger-to-nose intact bilaterally.   ASSESSMENT/PLAN David [name] is a 65 y.o.  left handed black male  with current cocaine use, hypertension, dyslipidemia, and recent (02/2021) admission for left mca/pca territory strokes (discharged on aspirin and plavix but per pt report he never took them), who  presented to the hospital for altered mental status. Patient was seen falling by a bystander and EMS was called. On approach by EMS he was confused and had strong smell of urine. He was brought for further evaluation and management.  In the ED he was drowsy and difficult to arouse, but his answers were correct when he did speak. Family was called by ED and his son reports that patient is homeless.   CT head negative for any acute abnormalities. UDS positive for cocaine.CBC with no leukocytosis.  Mild anemia with hemoglobin 10.6.  CMP significant for AKI with creatinine 1.75 and BUN at 28.  COVID positive.  acid elevated at 3.6.      Chest x-ray negative for any acute abnormalities.  No evidence of pneumonia.  UA significant for small hematuria.  No signs of  infection.  MRI brain shows acute to subacute stroke at left corona radiata and right frontal white matter.   Encephalopathy  Fever  COVID + Consistent with encephalopathy, likely related to COVID, fever, stroke, substance abuse CSF WBC 1, RBC 2, protein 21 and glucose 64, crypto antigen neg, HSV PCR neg, VDRL neg Tmax 101.5->101.1-> afebrile COVID + on remedesivir  On vancomycin, Rocephin and acyclovir for neuro coverage -> no evidence of CNS infection now -> all discontinued.   Stroke:  left corona radiata and right frontal white matter infarct embolic secondary to synchronized small vessel disease source  given risk factors, continued cocaine use and noncompliance with meds CT head No acute abnormality noted. MRI Acute to subacute small vessel infarcts in the left corona radiata and right frontal white matter. MRA  No emergent finding or convincing change from a CTA 3 weeks ago. 2D Echo EF 65 to 70%.  LDL 103 HgbA1c 6.4 UDS + cocaine VTE prophylaxis - SCDs now given LP today aspirin 81 mg daily and clopidogrel 75 mg daily (not taking) prior to admission, now on aspirin 81 mg daily and plavix 75mg  DAPT for 3 weeks and then ASA alone.  Therapy recommendations:  SNF Disposition:  pending  Hx of stroke 07/2017 with left BG/CR acute infarct, old left pontine and right caudate infarct.  MRA and carotid Doppler negative.  A1c 6.2, LDL 114, UDS positive for cocaine and THC, EF 60 to 65%.  Discharged on aspirin and Lipitor, with residual mild left-sided weakness. 02/2021 he was admitted again for acute stroke at left CR and left MCA/ACA border zone.  CTA head and neck left A2 stenosis, possible BA moderate stenosis.  EF 55 to 70%, LDL 133, A1c 6.4.  UDS negative at the time.  Discharged with DAPT and Lipitor 40.  Cocaine abuse UDS positive for cocaine Cocaine cessation education provided Patient is willing to quit  Hypertension Home meds:  none Stable Long-term BP goal normotensive  Hyperlipidemia Home meds:  lipitor 80 mg, resumed in hospital LDL 103, goal < 70 However, elevated LFTs AST/ALT 105/51-> 90/41->77/33->102/49 acute hepatitis panel negative Will recommend to hold off lipitor for now and restart once LFT normalizes. - discussed with Family medicine service   Other Stroke Risk Factors Advanced Age >/= 78   Other Active Problems Homeless Leukopenia WBC 3.4->2.7->3.3, HIV neg Hypokalemia K 3.1->3.8->3.9  Hospital day # 5  Neurology will sign off. Please call with questions. Pt will follow up with stroke clinic NP at [facility] in about 4 weeks. Thanks for the consult.   Rosalin Hawking, MD PhD Stroke Neurology 04/10/2021 6:47 PM   To contact Stroke Continuity provider, please refer to [email or web address] After hours, contact General Neurology

## 2021-04-10 NOTE — Progress Notes (Signed)
Family Medicine Teaching Service Daily Progress Note Intern Pager: [phone]  Patient name: David [name] Medical record number: [id number] Date of birth: 1956/07/19 Age: 65 y.o. Gender: male  Primary Care Provider: Kinnie Feil, MD Consultants: ID (s/o), Neuro  Code Status: Full  Pt Overview and Major Events to Date:  8/7- admitted  Assessment and Plan: David [name] is a 65yo male who presented with altered mental status and sepsis 2/2 COVID-19 infection, now with ?acute to subacute frontal CVA. PMH significant for CVA (July 2022), falls, HLD, HTN, prediabetes, cocaine abuse  Encephalopathy, resolved Acute to Subacute CVA Mental status remains clear. D/c'd antibiotics yesterday with CSF not concerning for CNS infection. Still receiving acyclovir as HSV has not resulted yet. - Can d/c acyclovir if HSV negative - ASA, Plavix, Lipitor - Awaiting SNF Placement - Will discharge with 30 day cardiac event monitor, Dr. Sallyanne Kuster to follow-up   COVID-19 Infection  Sepsis on admission, resolved Tested pos on 8/7. Never required O2. Blood, urine, CSF cultures with no growth. No leukocytosis. - Last dose remdesivir today  - Can d/c isolation tomorrow   ? Depression Patient with depressed mood and affect throughout hospitalization. Per son's report, patient has seemed depressed for years and has been "self-medication" with etOH and drugs. Patient says he will "think about it."  - Consider initiating Lexapro tomorrow  HTN, chronic, stable  BP 110s-130s/80-90s.  - Continue amlodipine 5mg    BPH, chronic, stable - Continue Tamsulosin 0.4mg  daily  FEN/GI: Heart healthy PPx: Lovenox Dispo:SNF today. Barriers include placement .   Subjective:  David [name] has no complaints this morning. No fever/chills. No CP/SOB.  When asked about mood, states that he "maybe" has some depressed mood but would like to "think about it" before initiating anti-depressant therapy.  Is amenable to discharge to SNF  when a bed comes available.   Objective: Temp:  [97.6 F (36.4 C)-98.6 F (37 C)] 98.3 F (36.8 C) (08/11 0819) Pulse Rate:  [66-91] 66 (08/11 0819) Resp:  [14-22] 19 (08/11 0819) BP: (105-148)/(57-96) 128/96 (08/11 0819) SpO2:  [98 %-100 %] 100 % (08/11 0819) Physical Exam: General: Depressed appearing, lying in bed Cardiovascular: RRR, no m/r/g Respiratory: Normal WOB on RA, no wheezes, rales, rhonchi Abdomen: Soft, non-tender, non-distended  Extremities: Without edema  Laboratory: Recent Labs  Lab 04/08/21 0443 04/09/21 0329 04/10/21 0155  WBC 3.4* 2.7* 3.3*  HGB 11.8* 11.3* 11.6*  HCT 35.4* 33.1* 35.4*  PLT 185 172 178   Recent Labs  Lab 04/08/21 0443 04/09/21 0329 04/10/21 0155  NA 134* 135 136  K 3.1* 3.8 3.9  CL 102 103 105  CO2 24 23 23   BUN 13 13 13   CREATININE 1.19 0.99 1.04  CALCIUM 7.8* 7.9* 8.2*  PROT 6.4* 6.1* 6.6  BILITOT 0.7 0.4 0.5  ALKPHOS 61 51 63  ALT 41 33 49*  AST 90* 77* 102*  GLUCOSE 112* 90 99    Imaging/Diagnostic Tests: No new imaging, tests  Eppie Gibson, MD 04/10/2021, 8:26 AM PGY-1, [place] Intern pager: [phone], text pages welcome

## 2021-04-10 NOTE — Progress Notes (Signed)
FPTS Interim Night Progress Note  S:Patient sleeping comfortably.  Rounded with primary night RN.  No concerns voiced.  No orders required.    O: Today's Vitals   04/09/21 1615 04/09/21 2022 04/10/21 0000 04/10/21 0348  BP:  135/80 (!) 131/94 137/86  Pulse:  88 87 79  Resp:  18 18 16   Temp:  98.5 F (36.9 C) 98.6 F (37 C) 98.2 F (36.8 C)  TempSrc:  Oral Oral Oral  SpO2:  100% 98% 100%  Weight:      Height:      PainSc: 0-No pain 0-No pain        A/P: Continue current management F/u CSF HSV results  Carollee Leitz MD PGY-3, [place] Medicine Service pager [phone]

## 2021-04-10 NOTE — TOC Progression Note (Signed)
Transition of Care [facility]) - Progression Note    Patient Details  Name: David [name] MRN: [id number] Date of Birth: [birth year]  Transition of Care [facility]) CM/SW [place], [place] Phone Number: 04/10/2021, 10:19 AM  Clinical Narrative:   CSW acknowledging PT/OT recommendation for SNF placement. Prior to hospitalization, patient is homeless with active cocaine abuse, so any bed offers for SNF are highly unlikely. CSW has faxed patient out for SNF and will follow to see if anyone offers on the patient.     Expected Discharge Plan: Homeless Shelter Barriers to Discharge: Continued Medical Work up, Ship broker, Homeless with medical needs, SNF Covid Recovering, Active Substance Use - Placement, Family Issues  Expected Discharge Plan and Services Expected Discharge Plan: Homeless Shelter       Living arrangements for the past 2 months: Homeless                                       Social Determinants of Health (SDOH) Interventions    Readmission Risk Interventions No flowsheet data found.

## 2021-04-10 NOTE — Progress Notes (Signed)
Physical Therapy Treatment Patient Details Name: David [name] MRN: [id number] DOB: October 14, 1955 Today's Date: 04/10/2021    History of Present Illness Pt is a 65 y.o. male who presented 04/05/21 with AMS and pt falling. MRI revealed acute to subacute small vessel infarcts in the left corona  radiata and right frontal white matter. UDS positive for cocaine. Pt with AKI and sepsis 2/2 COVID infection. Patient was recently admitted from 03/13/21-03/15/21 for new CVA. PMH: CVA, falls, hyperlipidemia, hypertension, prediabetes, BPH, substance use disorder.    PT Comments    Patient progressing towards physical therapy goals. Patient ambulated 40' x 2 this session with RW and minA. Improved activity tolerance this session. Able to perform sit to stand x 10 with min guard and slightly elevated surface. Continue to recommend SNF for ongoing Physical Therapy.       Follow Up Recommendations  SNF;Supervision/Assistance - 24 hour     Equipment Recommendations  Other (comment) (TBD)    Recommendations for Other Services       Precautions / Restrictions Precautions Precautions: Fall Precaution Comments: COVID+ Restrictions Weight Bearing Restrictions: No    Mobility  Bed Mobility Overal bed mobility: Needs Assistance Bed Mobility: Supine to Sit;Sit to Supine     Supine to sit: Supervision Sit to supine: Supervision   General bed mobility comments: supervision for safety    Transfers Overall transfer level: Needs assistance Equipment used: Rolling Courtnie Brenes (2 wheeled) Transfers: Sit to/from Stand Sit to Stand: Mod assist;From elevated surface;Min guard         General transfer comment: modA from low bed surface. Increased bed surface height with patient able to perform sit to stand x 10 with min guard  Ambulation/Gait Ambulation/Gait assistance: Min assist Gait Distance (Feet): 40 Feet (x40') Assistive device: Rolling Trysten Berti (2 wheeled) Gait Pattern/deviations: Decreased stride  length;Decreased dorsiflexion - right;Decreased weight shift to right;Trunk flexed;Narrow base of support Gait velocity: decreased   General Gait Details: MinA for balance. LOB x 1 requiring minA to recover   Stairs             Wheelchair Mobility    Modified Rankin (Stroke Patients Only) Modified Rankin (Stroke Patients Only) Pre-Morbid Rankin Score: Slight disability Modified Rankin: Moderately severe disability     Balance Overall balance assessment: Needs assistance Sitting-balance support: Single extremity supported Sitting balance-Leahy Scale: Fair     Standing balance support: Bilateral upper extremity supported Standing balance-Leahy Scale: Poor Standing balance comment: reliant on UE support and external assist                            Cognition Arousal/Alertness: Awake/alert Behavior During Therapy: Flat affect Overall Cognitive Status: No family/caregiver present to determine baseline cognitive functioning Area of Impairment: Attention;Following commands;Safety/judgement;Awareness;Problem solving                   Current Attention Level: Selective   Following Commands: Follows one step commands with increased time Safety/Judgement: Decreased awareness of safety;Decreased awareness of deficits Awareness: Intellectual Problem Solving: Slow processing;Decreased initiation;Difficulty sequencing;Requires verbal cues;Requires tactile cues        Exercises      General Comments        Pertinent Vitals/Pain Pain Assessment: No/denies pain    Home Living                      Prior Function  PT Goals (current goals can now be found in the care plan section) Acute Rehab PT Goals Patient Stated Goal: not stated PT Goal Formulation: With patient Time For Goal Achievement: 04/20/21 Potential to Achieve Goals: Good Progress towards PT goals: Progressing toward goals    Frequency    Min 3X/week       PT Plan Current plan remains appropriate    Co-evaluation              AM-PAC PT "6 Clicks" Mobility   Outcome Measure  Help needed turning from your back to your side while in a flat bed without using bedrails?: A Little Help needed moving from lying on your back to sitting on the side of a flat bed without using bedrails?: A Little Help needed moving to and from a bed to a chair (including a wheelchair)?: A Little Help needed standing up from a chair using your arms (e.g., wheelchair or bedside chair)?: A Little Help needed to walk in hospital room?: A Little Help needed climbing 3-5 steps with a railing? : A Little 6 Click Score: 18    End of Session Equipment Utilized During Treatment: Gait belt Activity Tolerance: Patient tolerated treatment well Patient left: in bed;with call bell/phone within reach;with bed alarm set Nurse Communication: Mobility status PT Visit Diagnosis: Unsteadiness on feet (R26.81);Repeated falls (R29.6);History of falling (Z91.81);Muscle weakness (generalized) (M62.81);Hemiplegia and hemiparesis;Difficulty in walking, not elsewhere classified (R26.2);Other abnormalities of gait and mobility (R26.89);Other symptoms and signs involving the nervous system (R29.898);Apraxia (R48.2)     Time: 9038-3338 PT Time Calculation (min) (ACUTE ONLY): 24 min  Charges:  $Gait Training: 8-22 mins $Therapeutic Activity: 8-22 mins                     Othel Hoogendoorn A. Gilford Rile PT, DPT Acute Rehabilitation Services Pager [phone] Office [phone]    Linna Hoff 04/10/2021, 5:11 PM

## 2021-04-10 NOTE — NC FL2 (Signed)
[place] LEVEL OF CARE SCREENING TOOL     IDENTIFICATION  Patient Name: David [name] Birthdate: 1956-02-19 Sex: male Admission Date (Current Location): 04/05/2021  [facility] and [place] Number:  Herbalist and Address:  [facility] [place], [place] [address], [place], [place] [address]      Provider Number: [id number]  Attending Physician Name and Address:  McDiarmid, Blane Ohara, MD  Relative Name and Phone Number:       Current Level of Care: Hospital Recommended Level of Care: [place] Prior Approval Number:    Date Approved/Denied:   PASRR Number: [id number]  Discharge Plan: SNF    Current Diagnoses: Patient Active Problem List   Diagnosis Date Noted   Cerebral thrombosis with cerebral infarction 04/07/2021   COVID-19    Acute kidney insufficiency    Altered mental status 04/05/2021   Cerebrovascular accident (CVA) ([place]) 03/13/2021   Concussion with no loss of consciousness    Laceration of forehead    Strain of right pectoralis muscle 03/14/2020   Bilateral leg weakness 03/14/2020   Numbness of right thumb 10/26/2018   Health care maintenance 10/26/2018   History of stroke 04/29/2018   Hearing impaired 04/29/2018   Erectile dysfunction 09/01/2017   Mild tetrahydrocannabinol (THC) abuse    Slurred speech 08/03/2017   Left hip pain 11/06/2016   Prediabetes 11/06/2016   Labral tear of shoulder 01/25/2014   Adhesive capsulitis of left shoulder 12/05/2013   Trigger point of neck 08/16/2012   Hyperlipidemia 08/08/2012   Elevated LFTs 08/08/2012   Cocaine abuse ([place]) 08/08/2012   Chronic pain 06/01/2012   BPH (benign prostatic hyperplasia) 09/09/2011   ROTATOR CUFF TEAR 12/03/2008    Orientation RESPIRATION BLADDER Height & Weight     Self, Time, Place  Normal Incontinent Weight: 149 lb 14.6 oz (68 kg) Height:  5\' 10"  (177.8 cm)  BEHAVIORAL SYMPTOMS/MOOD NEUROLOGICAL BOWEL NUTRITION STATUS      Incontinent  Diet (heart healthy)  AMBULATORY STATUS COMMUNICATION OF NEEDS Skin   Limited Assist Verbally Skin abrasions                       Personal Care Assistance Level of Assistance  Bathing, Feeding, Dressing Bathing Assistance: Limited assistance Feeding assistance: Independent Dressing Assistance: Limited assistance     Functional Limitations Info             SPECIAL CARE FACTORS FREQUENCY  PT (By licensed PT), OT (By licensed OT)     PT Frequency: 5x/wk OT Frequency: 5x/wk            Contractures Contractures Info: Not present    Additional Factors Info  Code Status, Allergies Code Status Info: Full Allergies Info: NKA           Current Medications (04/10/2021):  This is the current hospital active medication list Current Facility-Administered Medications  Medication Dose Route Frequency Provider Last Rate Last Admin   acetaminophen (TYLENOL) tablet 650 mg  650 mg Oral Q6H PRN Eppie Gibson, MD   650 mg at 04/07/21 1147   acyclovir (ZOVIRAX) 680 mg in dextrose 5 % 100 mL IVPB  10 mg/kg Intravenous Q8H Rosiland Oz, MD 113.6 mL/hr at 04/10/21 0525 680 mg at 04/10/21 0525   amLODipine (NORVASC) tablet 5 mg  5 mg Oral Daily Simmons-Robinson, Makiera, MD   5 mg at 04/10/21 0912   aspirin EC tablet 81 mg  81 mg Oral Daily  Lattie Haw, MD   81 mg at 04/10/21 0912   atorvastatin (LIPITOR) tablet 80 mg  80 mg Oral Daily Lattie Haw, MD   80 mg at 04/10/21 0912   clopidogrel (PLAVIX) tablet 75 mg  75 mg Oral Daily Eppie Gibson, MD   75 mg at 04/10/21 0914   enoxaparin (LOVENOX) injection 40 mg  40 mg Subcutaneous Daily Jim Like B, MD   40 mg at 04/10/21 0912   polyethylene glycol (MIRALAX / GLYCOLAX) packet 17 g  17 g Oral BID Eppie Gibson, MD   17 g at 04/09/21 0841   senna (SENOKOT) tablet 8.6 mg  1 tablet Oral Daily Jim Like B, MD   8.6 mg at 04/09/21 0840   tamsulosin (FLOMAX) capsule 0.4 mg  0.4 mg Oral Daily Lattie Haw, MD   0.4 mg  at 04/10/21 0100     Discharge Medications: Please see discharge summary for a list of discharge medications.  Relevant Imaging Results:  Relevant Lab Results:   Additional Information SS#: [id number]  Geralynn Ochs, LCSW

## 2021-04-11 DIAGNOSIS — U071 COVID-19: Secondary | ICD-10-CM | POA: Diagnosis not present

## 2021-04-11 LAB — CSF CULTURE W GRAM STAIN: Culture: NO GROWTH

## 2021-04-11 MED ORDER — ESCITALOPRAM OXALATE 10 MG PO TABS
10.0000 mg | ORAL_TABLET | Freq: Every day | ORAL | Status: DC
  Administered 2021-04-11 – 2021-04-18 (×8): 10 mg via ORAL
  Filled 2021-04-11 (×9): qty 1

## 2021-04-11 NOTE — TOC Progression Note (Signed)
Transition of Care [facility]) - Progression Note    Patient Details  Name: David [name] MRN: [id number] Date of Birth: 04/15/1956  Transition of Care [facility]) CM/SW [place], [place] Phone Number: 04/11/2021, 2:36 PM  Clinical Narrative:   CSW following for disposition. Patient continues to have no bed offers at this time.     Expected Discharge Plan: Homeless Shelter Barriers to Discharge: Continued Medical Work up, Ship broker, Homeless with medical needs, SNF Covid Recovering, Active Substance Use - Placement, Family Issues  Expected Discharge Plan and Services Expected Discharge Plan: Homeless Shelter       Living arrangements for the past 2 months: Homeless                                       Social Determinants of Health (SDOH) Interventions    Readmission Risk Interventions No flowsheet data found.

## 2021-04-11 NOTE — Plan of Care (Signed)
  Problem: Education: Goal: Knowledge of disease or condition will improve Outcome: Progressing Goal: Knowledge of secondary prevention will improve Outcome: Progressing Goal: Knowledge of patient specific risk factors addressed and post discharge goals established will improve Outcome: Progressing   Problem: Spontaneous Subarachnoid Hemorrhage Tissue Perfusion: Goal: Complications of Spontaneous Subarachnoid Hemorrhage will be minimized Outcome: Progressing

## 2021-04-11 NOTE — Progress Notes (Signed)
Family Medicine Teaching Service Daily Progress Note Intern Pager: [phone]  Patient name: David [name] Medical record number: [id number] Date of birth: 01-18-1956 Age: 66 y.o. Gender: male  Primary Care Provider: Kinnie Feil, MD Consultants: ID (s/o), Neuro (s/o) Code Status: Full  Pt Overview and Major Events to Date:  8/7- admitted  Assessment and Plan: David [name] is a 65 year old male who presented with altered mental status and sepsis secondary to COVID-19 infection, now with a question of acute versus subacute frontal CVA.  He has a history significant for CVA in July 2022, falls, hyperlipidemia, hypertension, prediabetes, cocaine use.  Patient is now medically stable for discharge to SNF  Encephalopathy, resolved Acute to Subacute CVA Mental status clear this morning.  Now off all antimicrobials. -ASA, Plavix, Lipitor -Awaiting SNF -We will discharge with 30-day cardiac event monitor, DR. Croitoru to follow-up  COVID-19 Infection  Sepsis, resovled Test positive on 8/7.  Remains afebrile and without leukocytosis.  Never required supplemental oxygen.  Blood, urine, CSF cultures with no growth.  Completed course of remdesivir yesterday -Can discontinue isolation precautions  ?Depression He is amenable to trialing an SSRI. -Start Lexapro 10mg  per day  HTN, chronic stable BP 117/89. -Continue amlodipine 5 mg daily  BPH, chronic, stable - Continue Tamsulosin 0.4mg  daily  FEN/GI: Heart healthy PPx: Lovenox Dispo:SNF today. Barriers include placement.   Subjective:  David [name] reports feeling well today.  He has no complaints.  No further fevers or chills.  He would appreciate being taken off of COVID precautions.  He is amenable to starting an antidepressant at this time.  Objective: Temp:  [98.1 F (36.7 C)-98.6 F (37 C)] 98.2 F (36.8 C) (08/12 0824) Pulse Rate:  [72-103] 103 (08/12 0824) Resp:  [18] 18 (08/12 0824) BP: (117-142)/(81-95) 117/89 (08/12  0824) SpO2:  [99 %-100 %] 99 % (08/12 0824) Physical Exam: General: Well-appearing, sitting up in bed Cardiovascular: Regular rate, regular rhythm, no murmur Respiratory: Normal work of breathing on room air, no wheezes Abdomen: Soft, nontender, nondistended Extremities: Without edema  Laboratory: Recent Labs  Lab 04/08/21 0443 04/09/21 0329 04/10/21 0155  WBC 3.4* 2.7* 3.3*  HGB 11.8* 11.3* 11.6*  HCT 35.4* 33.1* 35.4*  PLT 185 172 178   Recent Labs  Lab 04/08/21 0443 04/09/21 0329 04/10/21 0155  NA 134* 135 136  K 3.1* 3.8 3.9  CL 102 103 105  CO2 24 23 23   BUN 13 13 13   CREATININE 1.19 0.99 1.04  CALCIUM 7.8* 7.9* 8.2*  PROT 6.4* 6.1* 6.6  BILITOT 0.7 0.4 0.5  ALKPHOS 61 51 63  ALT 41 33 49*  AST 90* 77* 102*  GLUCOSE 112* 90 99     Imaging/Diagnostic Tests: No new imaging, tests  Eppie Gibson, MD 04/11/2021, 9:39 AM PGY-1, [place] Intern pager: [phone], text pages welcome

## 2021-04-11 NOTE — Progress Notes (Signed)
Physical Therapy Treatment Patient Details Name: David [name] MRN: [id number] DOB: 1955-10-20 Today's Date: 04/11/2021    History of Present Illness Pt is a 65 y.o. male who presented 04/05/21 with AMS and pt falling. MRI revealed acute to subacute small vessel infarcts in the left corona  radiata and right frontal white matter. UDS positive for cocaine. Pt with AKI and sepsis 2/2 COVID infection. Patient was recently admitted from 03/13/21-03/15/21 for new CVA. PMH: CVA, falls, hyperlipidemia, hypertension, prediabetes, BPH, substance use disorder.    PT Comments    Patient continues to be limited by cognition however improved. Patient requires minA for ambulation with RW for balance and RW management. Patient performed repeated sit to stands x 10 from low bed surface with min guard. Patient continues to be easily fatigued and requests to return to bed. Continue to recommend SNF for ongoing Physical Therapy.       Follow Up Recommendations  SNF;Supervision/Assistance - 24 hour     Equipment Recommendations  Other (comment) (TBD)    Recommendations for Other Services       Precautions / Restrictions Precautions Precautions: Fall Precaution Comments: COVID+ Restrictions Weight Bearing Restrictions: No    Mobility  Bed Mobility Overal bed mobility: Needs Assistance Bed Mobility: Supine to Sit;Sit to Supine     Supine to sit: Supervision Sit to supine: Supervision   General bed mobility comments: supervision for safety    Transfers Overall transfer level: Needs assistance Equipment used: Rolling Maricia Scotti (2 wheeled) Transfers: Sit to/from Stand Sit to Stand: Min assist;Min guard         General transfer comment: minA initially progressing to min guard. Sit to stand x10 from low bed surface  Ambulation/Gait Ambulation/Gait assistance: Min assist Gait Distance (Feet): 60 Feet Assistive device: Rolling Gittel Mccamish (2 wheeled) Gait Pattern/deviations: Decreased stride  length;Decreased dorsiflexion - right;Decreased weight shift to right;Trunk flexed;Narrow base of support Gait velocity: decreased   General Gait Details: minA for balance and cues required for keeping RW on ground and slowed pace during turns   Marine scientist Rankin (Stroke Patients Only)       Balance Overall balance assessment: Needs assistance Sitting-balance support: Feet supported Sitting balance-Leahy Scale: Fair     Standing balance support: Bilateral upper extremity supported Standing balance-Leahy Scale: Poor Standing balance comment: reliant on UE support and external assist                            Cognition Arousal/Alertness: Awake/alert Behavior During Therapy: Flat affect Overall Cognitive Status: No family/caregiver present to determine baseline cognitive functioning Area of Impairment: Attention;Following commands;Safety/judgement;Awareness;Problem solving                   Current Attention Level: Selective   Following Commands: Follows one step commands consistently Safety/Judgement: Decreased awareness of safety;Decreased awareness of deficits Awareness: Emergent Problem Solving: Slow processing;Decreased initiation;Requires verbal cues        Exercises Other Exercises Other Exercises: sit to stand x 10 from low bed surface    General Comments        Pertinent Vitals/Pain Pain Assessment: No/denies pain    Home Living                      Prior Function            PT Goals (  current goals can now be found in the care plan section) Acute Rehab PT Goals Patient Stated Goal: not stated PT Goal Formulation: With patient Time For Goal Achievement: 04/20/21 Potential to Achieve Goals: Good Progress towards PT goals: Progressing toward goals    Frequency    Min 3X/week      PT Plan Current plan remains appropriate    Co-evaluation              AM-PAC  PT "6 Clicks" Mobility   Outcome Measure  Help needed turning from your back to your side while in a flat bed without using bedrails?: A Little Help needed moving from lying on your back to sitting on the side of a flat bed without using bedrails?: A Little Help needed moving to and from a bed to a chair (including a wheelchair)?: A Little Help needed standing up from a chair using your arms (e.g., wheelchair or bedside chair)?: A Little Help needed to walk in hospital room?: A Little Help needed climbing 3-5 steps with a railing? : A Little 6 Click Score: 18    End of Session Equipment Utilized During Treatment: Gait belt Activity Tolerance: Patient tolerated treatment well Patient left: in bed;with call bell/phone within reach;with bed alarm set Nurse Communication: Mobility status PT Visit Diagnosis: Unsteadiness on feet (R26.81);Repeated falls (R29.6);History of falling (Z91.81);Muscle weakness (generalized) (M62.81);Hemiplegia and hemiparesis;Difficulty in walking, not elsewhere classified (R26.2);Other abnormalities of gait and mobility (R26.89);Other symptoms and signs involving the nervous system (R29.898);Apraxia (R48.2)     Time: 1638-4536 PT Time Calculation (min) (ACUTE ONLY): 20 min  Charges:  $Gait Training: 8-22 mins                     Ishita Mcnerney A. Gilford Rile PT, DPT Acute Rehabilitation Services Pager [phone] Office [phone]    Linna Hoff 04/11/2021, 5:24 PM

## 2021-04-11 NOTE — Progress Notes (Signed)
FPTS Interim Night Progress Note  S:Patient sleeping comfortably.  Rounded with primary night RN.  No concerns voiced.  No orders required.    O: Today's Vitals   04/10/21 1206 04/10/21 1224 04/10/21 2007 04/10/21 2346  BP:  (!) 131/93 137/81 (!) 142/95  Pulse:  72 79 85  Resp:  18 18 18   Temp:  98.3 F (36.8 C) 98.1 F (36.7 C) 98.6 F (37 C)  TempSrc:  Oral Oral Oral  SpO2:  100% 100% 99%  Weight:      Height:      PainSc: 0-No pain         A/P: Continue current management   Carollee Leitz MD PGY-3, [facility] Service pager [phone]

## 2021-04-11 NOTE — Progress Notes (Signed)
Occupational Therapy Treatment Patient Details Name: David [name] MRN: [id number] DOB: 1956-01-01 Today's Date: 04/11/2021    History of present illness Pt is a 65 y.o. male who presented 04/05/21 with AMS and pt falling. MRI revealed acute to subacute small vessel infarcts in the left corona  radiata and right frontal white matter. UDS positive for cocaine. Pt with AKI and sepsis 2/2 COVID infection. Patient was recently admitted from 03/13/21-03/15/21 for new CVA. PMH: CVA, falls, hyperlipidemia, hypertension, prediabetes, BPH, substance use disorder.   OT comments  Patient with fair progress toward patient focused goals.  He is still disoriented to time and his situation, but is demonstrating improved mentation.  No verbal cues to complete stand grooming task sink side this date.  Patient needing up to [place] for transfers, and up to Mod A for ADL completion at sit/stand level.  Continue to recommend SNF for post acute rehab, as he currently needs 24 hour assist, and this is not possible given his living situation.  OT will continue to follow in the acute setting to maximize his functional status.    Follow Up Recommendations  SNF;Supervision/Assistance - 24 hour    Equipment Recommendations       Recommendations for Other Services      Precautions / Restrictions Precautions Precautions: Fall Precaution Comments: COVID+ Restrictions Weight Bearing Restrictions: No       Mobility Bed Mobility Overal bed mobility: Needs Assistance Bed Mobility: Supine to Sit     Supine to sit: Min assist       Patient Response: Flat affect  Transfers Overall transfer level: Needs assistance Equipment used: Rolling walker (2 wheeled) Transfers: Sit to/from Stand Sit to Stand: Min guard              Balance Overall balance assessment: Needs assistance Sitting-balance support: Feet supported Sitting balance-Leahy Scale: Fair     Standing balance support: Single extremity  supported Standing balance-Leahy Scale: Poor Standing balance comment: reliant on UE support and external assist                           ADL either performed or assessed with clinical judgement   ADL   Eating/Feeding: Set up;Bed level   Grooming: Oral care;Min guard;Standing;Wash/dry hands;Wash/dry face           Upper Body Dressing : Minimal assistance;Cueing for safety;Sitting;Standing Upper Body Dressing Details (indicate cue type and reason): balance suppout in standing. Lower Body Dressing: Minimal assistance;Sit to/from stand               Functional mobility during ADLs: Minimal assistance General ADL Comments: HHA                       Cognition Arousal/Alertness: Awake/alert                       Orientation Level: Disoriented to;Time;Situation Current Attention Level: Selective   Following Commands: Follows one step commands consistently Safety/Judgement: Decreased awareness of safety;Decreased awareness of deficits   Problem Solving: Slow processing;Decreased initiation;Requires verbal cues General Comments: improved mentation and ability to participate with automatic tasks without cues.                          Pertinent Vitals/ Pain       Pain Assessment: No/denies pain  Frequency  Min 2X/week        Progress Toward Goals  OT Goals(current goals can now be found in the care plan section)  Progress towards OT goals: Progressing toward goals  Acute Rehab OT Goals Patient Stated Goal: not stated OT Goal Formulation: With patient Time For Goal Achievement: 04/20/21 Potential to Achieve Goals: Good  Plan Discharge plan remains appropriate    Co-evaluation                 AM-PAC OT "6 Clicks" Daily Activity     Outcome Measure   Help from another person eating meals?: None Help from another person taking care  of personal grooming?: None Help from another person toileting, which includes using toliet, bedpan, or urinal?: A Little Help from another person bathing (including washing, rinsing, drying)?: A Lot Help from another person to put on and taking off regular upper body clothing?: A Little Help from another person to put on and taking off regular lower body clothing?: A Lot 6 Click Score: 18    End of Session Equipment Utilized During Treatment: Rolling walker  OT Visit Diagnosis: Unsteadiness on feet (R26.81);Repeated falls (R29.6);Muscle weakness (generalized) (M62.81);Other symptoms and signs involving cognitive function;Hemiplegia and hemiparesis Hemiplegia - Right/Left: Right Hemiplegia - dominant/non-dominant: Non-Dominant Hemiplegia - caused by: Cerebral infarction   Activity Tolerance Patient tolerated treatment well   Patient Left in bed;with call bell/phone within reach;with bed alarm set   Nurse Communication          Time: 7989-2119 OT Time Calculation (min): 17 min  Charges: OT General Charges $OT Visit: 1 Visit OT Treatments $Self Care/Home Management : 8-22 mins  04/11/2021  Rich, OTR/L  Acute Rehabilitation Services  Office:  [place] 04/11/2021, 12:23 PM

## 2021-04-12 ENCOUNTER — Encounter (HOSPITAL_COMMUNITY): Payer: Self-pay | Admitting: Family Medicine

## 2021-04-12 DIAGNOSIS — I6330 Cerebral infarction due to thrombosis of unspecified cerebral artery: Secondary | ICD-10-CM | POA: Diagnosis not present

## 2021-04-12 LAB — HEPATIC FUNCTION PANEL
ALT: 79 U/L — ABNORMAL HIGH (ref 0–44)
AST: 108 U/L — ABNORMAL HIGH (ref 15–41)
Albumin: 3.1 g/dL — ABNORMAL LOW (ref 3.5–5.0)
Alkaline Phosphatase: 80 U/L (ref 38–126)
Bilirubin, Direct: <0.1 mg/dL (ref 0.0–0.2)
Total Bilirubin: 0.7 mg/dL (ref 0.3–1.2)
Total Protein: 7.6 g/dL (ref 6.5–8.1)

## 2021-04-12 NOTE — Progress Notes (Signed)
FPTS Interim Progress Note  S: Patient resting comfortably watching TV. Denies any pain. Denies any questions or concerns.   O: BP (!) 145/92 (BP Location: Left Arm)   Pulse 80   Temp 98.6 F (37 C) (Oral)   Resp 19   Ht 5\' 10"  (1.778 m)   Wt 68 kg   SpO2 100%   BMI 21.51 kg/m    A/P: Continue current management   Shary Key, DO 04/12/2021, 10:56 PM PGY-2, [facility] Service pager [phone]

## 2021-04-12 NOTE — Plan of Care (Signed)
  Problem: Education: Goal: Knowledge of disease or condition will improve Outcome: Progressing Goal: Knowledge of secondary prevention will improve Outcome: Progressing Goal: Knowledge of patient specific risk factors addressed and post discharge goals established will improve Outcome: Progressing   Problem: Coping: Goal: Will verbalize positive feelings about self Outcome: Progressing Goal: Will identify appropriate support needs Outcome: Progressing   Problem: Ischemic Stroke/TIA Tissue Perfusion: Goal: Complications of ischemic stroke/TIA will be minimized Outcome: Progressing   

## 2021-04-12 NOTE — Progress Notes (Signed)
Family Medicine Teaching Service Daily Progress Note Intern Pager: [phone]  Patient name: David [name] Medical record number: [id number] Date of birth: 1955-09-13 Age: 65 y.o. Gender: male  Primary Care Provider: Kinnie Feil, MD Consultants: Neurology, ID Code Status: Full  Pt Overview and Major Events to Date:  08/07: Admitted to [facility] 08/13: LP  Assessment and Plan: David [name] is a 65 year old male who presented with altered mental status and sepsis secondary to COVID-19 infection, now with a question of acute versus subacute frontal CVA.  He has a history significant for CVA in July 2022, falls, hyperlipidemia, hypertension, prediabetes, cocaine use.    Patient is medically stable for discharge to SNF  CVA, acute to subacute small vessel infarcts in the left corona radiata and right frontal white matter Stable:  Mental status has improved. Oriented to person, place and time.  No focal deficits on exam but patient is sleepy this morning.   -Continue DAPT for 3 weeks then ASA 81 mg alone -Holding Lipitor for elevated LFTS -Repeat LFT's today -Continue to monitor for any neurological changes -Concern for cardiac arrhythmia as possible etiology for CVA, will need 30 day cardiac monitoring outpatient with Dr Sallyanne Kuster  COVID-19 Infection Resolved.  Isolation precautions discontinued 08/12  Depression Stable.Tolerating Lexapro.  Denies any SI/HI -Continue Lexapro 10 mg daily   HTN Stable. Normotensive Continue Amlodipine 5 mg daily  BPH Stable.  -Continue Tamsulosin 0.4mg  daily  FEN/GI: Heart healthy PPx: Lovenox Dispo:SNF today. Barriers include pending insurance authorization   Subjective:  No acute events overnight.  Patient slept well.  Answers appropriately but is sleepy given early wakening.  Pleasant and no concerns voiced.  Denies any SI/HI.  Objective: Temp:  [97.7 F (36.5 C)-98.4 F (36.9 C)] 98.4 F (36.9 C) (08/12 2318) Pulse Rate:  [80-103] 80 (08/12  2318) Resp:  [18] 18 (08/12 2318) BP: (107-149)/(74-95) 131/95 (08/12 2318) SpO2:  [99 %-100 %] 100 % (08/12 2318) Physical Exam: General: 65 y.o. male in NAD Cardio: RRR no m/r/g Lungs: CTAB, no wheezing, no rhonchi, no crackles, no IWOB on room air Abdomen: Soft, non-tender to palpation, non-distended, positive bowel sounds Skin: warm and dry Extremities: No edema   Laboratory: Recent Labs  Lab 04/08/21 0443 04/09/21 0329 04/10/21 0155  WBC 3.4* 2.7* 3.3*  HGB 11.8* 11.3* 11.6*  HCT 35.4* 33.1* 35.4*  PLT 185 172 178   Recent Labs  Lab 04/08/21 0443 04/09/21 0329 04/10/21 0155  NA 134* 135 136  K 3.1* 3.8 3.9  CL 102 103 105  CO2 24 23 23   BUN 13 13 13   CREATININE 1.19 0.99 1.04  CALCIUM 7.8* 7.9* 8.2*  PROT 6.4* 6.1* 6.6  BILITOT 0.7 0.4 0.5  ALKPHOS 61 51 63  ALT 41 33 49*  AST 90* 77* 102*  GLUCOSE 112* 90 99     Imaging/Diagnostic Tests: No results found.   Carollee Leitz, MD 04/12/2021, 1:12 AM PGY-3, [facility] Intern pager: [phone], text pages welcome

## 2021-04-13 DIAGNOSIS — E7800 Pure hypercholesterolemia, unspecified: Secondary | ICD-10-CM

## 2021-04-13 DIAGNOSIS — I6330 Cerebral infarction due to thrombosis of unspecified cerebral artery: Secondary | ICD-10-CM | POA: Diagnosis not present

## 2021-04-13 MED ORDER — ROSUVASTATIN CALCIUM 20 MG PO TABS: 20.0000 mg | ORAL_TABLET | Freq: Every day | ORAL | Status: DC

## 2021-04-13 MED ORDER — ROSUVASTATIN CALCIUM 20 MG PO TABS
40.0000 mg | ORAL_TABLET | Freq: Every day | ORAL | Status: DC
  Administered 2021-04-13 – 2021-04-25 (×13): 40 mg via ORAL
  Filled 2021-04-13 (×14): qty 2

## 2021-04-13 NOTE — Progress Notes (Signed)
Family Medicine Teaching Service Daily Progress Note Intern Pager: [phone]  Patient name: David [name] Medical record number: [id number] Date of birth: 11/17/1955 Age: 65 y.o. Gender: male  Primary Care Provider: Eniola, Kehinde T, MD Consultants: Neurology, ID Code Status: Full  Pt Overview and Major Events to Date:  8/7- admitted to FPTS 8/13- LP  Assessment and Plan: [name] is a 65yo male who presented with altered mental status and sepsis 2/2 COVID-19, now with ?acute vs subacute frontal CVA. He has a history of CVA in July 2022, falls, HLD, HTN, prediabetes, polysubstance abuse. He is now medically stable for discharge to SNF  CVA, acute to subacute small vessel infarcts in the left corona radiata and right frontal white matter No changes to mental status. Remains stable.  - Continue DAPT until 8/30, then ASA alone  - Holding lipitor in setting of elevated LFTs as discussed below - Trial Crestor as below  Elevated LFTs AST 108, ALT 79, Alk Phos 80, Bili 0.7. Had normal LFTs prior to stroke last month. At that time, Lipitor dose was I ncreased from 40mg to 80mg daily. Pattern suggestive of alcoholic hepatitis in setting of his known history of heavy drinking, but given the timeline corresponding to increasing Lipitor, consider this may be contributing. Last LDL 103 1 month ago.  - Will trial Crestor 40mg  - Trend LFTs  Depression - Continue Lexapro 10mg daily  HTN BP stable. 130s/90s. - Continue Amlodipine 5mg daily  BPH, chronic, stable - Continue tamsulosin 0.4mg daily  COVID-19, resolved - No further intervention  FEN/GI: Heart healthy PPx: Lovenox Dispo:SNF today. Barriers include pending placement .   Subjective:  [name] feels well this morning.  He has no pain, no shortness of breath.  States that he was on Lipitor prior to his most recent stroke but at a lower dose.  Objective: Temp:  [97.9 F (36.6 C)-98.8 F (37.1 C)] 98.2 F (36.8 C) (08/14  0740) Pulse Rate:  [80-89] 86 (08/14 0740) Resp:  [16-20] 18 (08/14 0740) BP: (114-145)/(70-93) 114/70 (08/14 0740) SpO2:  [99 %-100 %] 100 % (08/14 0328) Physical Exam: General: Well-appearing, lying in bed Cardiovascular: Regular rate, regular rhythm, no murmur Respiratory: Lung fields clear to auscultation, normal work of breathing on room air Abdomen: Soft, nontender, nondistended Extremities: Without edema  Laboratory: Recent Labs  Lab 04/08/21 0443 04/09/21 0329 04/10/21 0155  WBC 3.4* 2.7* 3.3*  HGB 11.8* 11.3* 11.6*  HCT 35.4* 33.1* 35.4*  PLT 185 172 178   Recent Labs  Lab 04/08/21 0443 04/09/21 0329 04/10/21 0155 04/12/21 0714  NA 134* 135 136  --   K 3.1* 3.8 3.9  --   CL 102 103 105  --   CO2 24 23 23  --   BUN 13 13 13  --   CREATININE 1.19 0.99 1.04  --   CALCIUM 7.8* 7.9* 8.2*  --   PROT 6.4* 6.1* 6.6 7.6  BILITOT 0.7 0.4 0.5 0.7  ALKPHOS 61 51 63 80  ALT 41 33 49* 79*  AST 90* 77* 102* 108*  GLUCOSE 112* 90 99  --      Imaging/Diagnostic Tests: No new imaging, tests  Sanford, James B, MD 04/13/2021, 11:08 AM PGY-1, [facility] FPTS Intern pager: [phone], text pages welcome  

## 2021-04-13 NOTE — Plan of Care (Signed)
  Problem: Education: Goal: Knowledge of disease or condition will improve Outcome: Progressing Goal: Knowledge of secondary prevention will improve Outcome: Progressing Goal: Knowledge of patient specific risk factors addressed and post discharge goals established will improve Outcome: Progressing   Problem: Coping: Goal: Will verbalize positive feelings about self Outcome: Progressing Goal: Will identify appropriate support needs Outcome: Progressing   Problem: Health Behavior/Discharge Planning: Goal: Ability to manage health-related needs will improve Outcome: Progressing   Problem: Self-Care: Goal: Ability to participate in self-care as condition permits will improve Outcome: Progressing Goal: Verbalization of feelings and concerns over difficulty with self-care will improve Outcome: Progressing Goal: Ability to communicate needs accurately will improve Outcome: Progressing   Problem: Nutrition: Goal: Risk of aspiration will decrease Outcome: Progressing Goal: Dietary intake will improve Outcome: Progressing   Problem: Intracerebral Hemorrhage Tissue Perfusion: Goal: Complications of Intracerebral Hemorrhage will be minimized Outcome: Progressing   Problem: Ischemic Stroke/TIA Tissue Perfusion: Goal: Complications of ischemic stroke/TIA will be minimized Outcome: Progressing   Problem: Spontaneous Subarachnoid Hemorrhage Tissue Perfusion: Goal: Complications of Spontaneous Subarachnoid Hemorrhage will be minimized Outcome: Progressing   Problem: Education: Goal: Knowledge of General Education information will improve Description: Including pain rating scale, medication(s)/side effects and non-pharmacologic comfort measures Outcome: Progressing   Problem: Health Behavior/Discharge Planning: Goal: Ability to manage health-related needs will improve Outcome: Progressing   Problem: Clinical Measurements: Goal: Ability to maintain clinical measurements within  normal limits will improve Outcome: Progressing Goal: Will remain free from infection Outcome: Progressing Goal: Diagnostic test results will improve Outcome: Progressing Goal: Respiratory complications will improve Outcome: Progressing Goal: Cardiovascular complication will be avoided Outcome: Progressing   Problem: Activity: Goal: Risk for activity intolerance will decrease Outcome: Progressing   Problem: Nutrition: Goal: Adequate nutrition will be maintained Outcome: Progressing   Problem: Coping: Goal: Level of anxiety will decrease Outcome: Progressing   Problem: Elimination: Goal: Will not experience complications related to bowel motility Outcome: Progressing Goal: Will not experience complications related to urinary retention Outcome: Progressing   Problem: Pain Managment: Goal: General experience of comfort will improve Outcome: Progressing   Problem: Safety: Goal: Ability to remain free from injury will improve Outcome: Progressing   Problem: Skin Integrity: Goal: Risk for impaired skin integrity will decrease Outcome: Progressing   

## 2021-04-14 ENCOUNTER — Other Ambulatory Visit (HOSPITAL_COMMUNITY): Payer: Self-pay

## 2021-04-14 DIAGNOSIS — Z992 Dependence on renal dialysis: Secondary | ICD-10-CM

## 2021-04-14 DIAGNOSIS — N186 End stage renal disease: Secondary | ICD-10-CM | POA: Diagnosis not present

## 2021-04-14 DIAGNOSIS — I6330 Cerebral infarction due to thrombosis of unspecified cerebral artery: Secondary | ICD-10-CM | POA: Diagnosis not present

## 2021-04-14 LAB — HEPATIC FUNCTION PANEL
ALT: 60 U/L — ABNORMAL HIGH (ref 0–44)
AST: 56 U/L — ABNORMAL HIGH (ref 15–41)
Albumin: 3.1 g/dL — ABNORMAL LOW (ref 3.5–5.0)
Alkaline Phosphatase: 90 U/L (ref 38–126)
Bilirubin, Direct: <0.1 mg/dL (ref 0.0–0.2)
Total Bilirubin: 0.7 mg/dL (ref 0.3–1.2)
Total Protein: 7.8 g/dL (ref 6.5–8.1)

## 2021-04-14 LAB — GLUCOSE, CAPILLARY: Glucose-Capillary: 94 mg/dL (ref 70–99)

## 2021-04-14 NOTE — Progress Notes (Signed)
Physical Therapy Treatment Patient Details Name: David [name] MRN: [id number] DOB: 1956/04/20 Today's Date: 04/14/2021    History of Present Illness Pt is a 65 y.o. male who presented 04/05/21 with AMS and pt falling. MRI revealed acute to subacute small vessel infarcts in the left corona  radiata and right frontal white matter. UDS positive for cocaine. Pt with AKI and sepsis 2/2 COVID infection. Patient was recently admitted from 03/13/21-03/15/21 for new CVA. PMH: CVA, falls, hyperlipidemia, hypertension, prediabetes, BPH, substance use disorder.    PT Comments    Pt received in bed in large amount of stool and pt unaware. Pt required mod A for power up with first time standing from bed. With subsequent stands and SPT, was able to perform with min A and RW.  Pt with R foot drag and ineffective step at times making his gait unsafe. He is able to correct with vc's but needs them frequently so unsafe to ambulate without min A and RW. Pt really not safe to be on street and not able to care for self. Recommend 24 hr supervision. PT will continue to follow.   Follow Up Recommendations  SNF;Supervision/Assistance - 24 hour     Equipment Recommendations  Rolling walker with 5" wheels    Recommendations for Other Services       Precautions / Restrictions Precautions Precautions: Fall Precaution Comments: COVID+ Restrictions Weight Bearing Restrictions: No    Mobility  Bed Mobility Overal bed mobility: Needs Assistance Bed Mobility: Supine to Sit;Sit to Supine     Supine to sit: Supervision Sit to supine: Supervision   General bed mobility comments: supervision for safety    Transfers Overall transfer level: Needs assistance Equipment used: Rolling walker (2 wheeled) Transfers: Sit to/from [facility] Sit to Stand: Min assist;Mod assist Stand pivot transfers: Min assist       General transfer comment: needed mod A for first stand from bed. Subsequent stands and  SPT with min A and RW for stability  Ambulation/Gait Ambulation/Gait assistance: Min assist Gait Distance (Feet): 50 Feet Assistive device: Rolling walker (2 wheeled) Gait Pattern/deviations: Decreased stride length;Decreased dorsiflexion - right;Decreased weight shift to right;Trunk flexed;Narrow base of support Gait velocity: decreased Gait velocity interpretation: <1.31 ft/sec, indicative of household ambulator General Gait Details: pt dragging R foot at times, vc's for attention to R step. Improved with vc's but needed them frequently   Stairs             Wheelchair Mobility    Modified Rankin (Stroke Patients Only) Modified Rankin (Stroke Patients Only) Pre-Morbid Rankin Score: Slight disability Modified Rankin: Moderately severe disability     Balance Overall balance assessment: Needs assistance Sitting-balance support: Feet supported Sitting balance-Leahy Scale: Fair Sitting balance - Comments: required minA for balance during dynamic ADL task seated EOB Postural control: Posterior lean Standing balance support: Bilateral upper extremity supported Standing balance-Leahy Scale: Poor Standing balance comment: reliant on UE support and external assist               High Level Balance Comments: pt maintained standing for bowel clean up            Cognition Arousal/Alertness: Awake/alert Behavior During Therapy: Flat affect Overall Cognitive Status: No family/caregiver present to determine baseline cognitive functioning Area of Impairment: Attention;Following commands;Safety/judgement;Awareness;Problem solving                 Orientation Level: Disoriented to;Time;Situation Current Attention Level: Selective Memory: Decreased short-term memory Following Commands: Follows one  step commands consistently Safety/Judgement: Decreased awareness of safety;Decreased awareness of deficits Awareness: Emergent Problem Solving: Slow processing;Decreased  initiation;Requires verbal cues General Comments: decreased body awareness, very flat throughout session. Did not recall that he had had a stroke      Exercises      General Comments General comments (skin integrity, edema, etc.): HR 130 bpm during standing for bowel clean up, 112 bpm with ambulation, 108 bpm after session      Pertinent Vitals/Pain Pain Assessment: No/denies pain    Home Living                      Prior Function            PT Goals (current goals can now be found in the care plan section) Acute Rehab PT Goals Patient Stated Goal: not stated PT Goal Formulation: With patient Time For Goal Achievement: 04/20/21 Potential to Achieve Goals: Good Progress towards PT goals: Progressing toward goals    Frequency    Min 3X/week      PT Plan Current plan remains appropriate    Co-evaluation              AM-PAC PT "6 Clicks" Mobility   Outcome Measure  Help needed turning from your back to your side while in a flat bed without using bedrails?: A Little Help needed moving from lying on your back to sitting on the side of a flat bed without using bedrails?: A Little Help needed moving to and from a bed to a chair (including a wheelchair)?: A Little Help needed standing up from a chair using your arms (e.g., wheelchair or bedside chair)?: A Little Help needed to walk in hospital room?: A Little Help needed climbing 3-5 steps with a railing? : A Little 6 Click Score: 18    End of Session Equipment Utilized During Treatment: Gait belt Activity Tolerance: Patient tolerated treatment well Patient left: in bed;with call bell/phone within reach;with bed alarm set Nurse Communication: Mobility status PT Visit Diagnosis: Unsteadiness on feet (R26.81);Repeated falls (R29.6);History of falling (Z91.81);Muscle weakness (generalized) (M62.81);Hemiplegia and hemiparesis;Difficulty in walking, not elsewhere classified (R26.2);Other abnormalities of gait  and mobility (R26.89);Other symptoms and signs involving the nervous system (R29.898);Apraxia (R48.2) Hemiplegia - Right/Left: Right Hemiplegia - dominant/non-dominant: Dominant Hemiplegia - caused by: Cerebral infarction     Time: 3419-6222 PT Time Calculation (min) (ACUTE ONLY): 22 min  Charges:  $Gait Training: 8-22 mins                     Leighton Roach, [place]  Pager [phone] Office [place] 04/14/2021, 2:21 PM

## 2021-04-14 NOTE — Progress Notes (Signed)
FPTS Interim Night Progress Note  S:Patient sleeping comfortably.  Rounded with primary night RN.  Reports no BM for 4 days.  Patient has been refusing Miralax.  No other concerns voiced.  No orders required.    O: Today's Vitals   04/13/21 1146 04/13/21 1400 04/13/21 2024 04/14/21 0038  BP: 109/73  129/87 (!) 132/97  Pulse: 79  84 81  Resp: 18  19 17   Temp: 98 F (36.7 C)  97.6 F (36.4 C) 98.3 F (36.8 C)  TempSrc:   Oral Oral  SpO2: 99%  100% 100%  Weight:      Height:      PainSc:  0-No pain        A/P: Encourage BID Miralax Continue Senna daily Oral hydration and increase in fibre OOB daily Continue current management   Carollee Leitz MD PGY-3, [place] Medicine Service pager [phone]

## 2021-04-14 NOTE — Progress Notes (Signed)
Family Medicine Teaching Service Daily Progress Note Intern Pager: [phone]  Patient name: David [name] Medical record number: [id number] Date of birth: [birth year] Age: 65 y.o. Gender: male  Primary Care Provider: Kinnie Feil, MD Consultants: Neuro (s/o), ID (s/o) Code Status: Full  Pt Overview and Major Events to Date:  8/7- admitted to FPTS  Assessment and Plan: David [name] is a 65yo male who presented with AMS and sepis secondary to COVID-19 infection, now with a question of acute versus subacute frontal CVA. History significant for CVA (July 2022), falls, HLD, HTN, prediabetes, cocaine use.  Patient is medically stable for discharge to SNF.  Encephalopathy, resolved Acute to Subacute CVA Mentating at baseline. Off antimicrobials. - Awaiting SNF - ASA, Plavix, Crestor - 30 day cardiac event monitor on discharge, Dr. Sallyanne Kuster to follow  Elevated LFTs AST 108>56. ALT 79>60. After switch from Lipitor to Crestor yesterday. - Continue Crestor 40mg  daily   Depression Mood improving - Continue Lexapro 10mg  daily  HTN, stable BP 130/86 - Continue amlodipine 5mg  daily  BPH, chronic, stable - Continue tamsulosin 0.4mg  daily   COVID, resolved Afebrile. On room air. Off precautions.   FEN/GI: Heart healthy PPx: Lovenox Dispo:SNF today. Barriers include placement.   Subjective:  David [name] denies any complaints at this time.  He says that he feels well and is still amenable to discharging to SNF.  Objective: Temp:  [97.6 F (36.4 C)-99.8 F (37.7 C)] 99.1 F (37.3 C) (08/15 0837) Pulse Rate:  [79-89] 89 (08/15 0837) Resp:  [17-20] 20 (08/15 0837) BP: (109-132)/(73-97) 111/80 (08/15 0837) SpO2:  [97 %-100 %] 97 % (08/15 0837) Physical Exam: General: Comfortable appearing, NAD Cardiovascular: Regular rate, regular rhythm, no murmur, rub, gallop Respiratory: Normal work of breathing on room air, lung fields CTA B Abdomen: Soft, nontender, nondistended Extremities:  Without edema  Laboratory: Recent Labs  Lab 04/08/21 0443 04/09/21 0329 04/10/21 0155  WBC 3.4* 2.7* 3.3*  HGB 11.8* 11.3* 11.6*  HCT 35.4* 33.1* 35.4*  PLT 185 172 178   Recent Labs  Lab 04/08/21 0443 04/09/21 0329 04/10/21 0155 04/12/21 0714 04/14/21 0349  NA 134* 135 136  --   --   K 3.1* 3.8 3.9  --   --   CL 102 103 105  --   --   CO2 24 23 23   --   --   BUN 13 13 13   --   --   CREATININE 1.19 0.99 1.04  --   --   CALCIUM 7.8* 7.9* 8.2*  --   --   PROT 6.4* 6.1* 6.6 7.6 7.8  BILITOT 0.7 0.4 0.5 0.7 0.7  ALKPHOS 61 51 63 80 90  ALT 41 33 49* 79* 60*  AST 90* 77* 102* 108* 56*  GLUCOSE 112* 90 99  --   --      Imaging/Diagnostic Tests: No new imaging, tests  Eppie Gibson, MD 04/14/2021, 8:42 AM PGY-1, [place] Intern pager: [phone], text pages welcome

## 2021-04-14 NOTE — Care Management Important Message (Signed)
Important Message  Patient Details  Name: David [name] MRN: [id number] Date of Birth: [birth year]   Medicare Important Message Given:  Yes     [name] 04/14/2021, 4:36 PM

## 2021-04-14 NOTE — Progress Notes (Signed)
Family Medicine Teaching Service Daily Progress Note Intern Pager: [phone]  Patient name: David [name] Medical record number: [id number] Date of birth: 14-Aug-1956 Age: 65 y.o. Gender: male  Primary Care Provider: Kinnie Feil, MD Consultants: Neuro (s/o), ID (s/o) Code Status: Full  Pt Overview and Major Events to Date:  8/7: Admitted to FPTS   Assessment and Plan: Mr. Haile is a 65yo male who presented with AMS and sepsis secondary to COVD-19 infection, now with acute vs subacute frontal CVA. Pt has a hx of CVA in July 2022, HLD, HTN, prediabetes, polysubstance abuse(UDS + for cocaine on this admission). CVA, acute to subacute small vessel infarcts in the left corona radiata and right frontal white matter. Pt is medically stable for d/c to SNF vs home with son.   Encephalopathy, resolved Acute to subacute frontal CVA Mentating at baseline. Off antimicrobials.  Denies any weakness. -Stable for d/c -Continue with ASA, Plavix, crestor -30 day cardiac event monitor on discharge, Dr. Sallyanne Kuster to follow   Elevated LFTs AST 108>56. ALT 79>60. Downtrending. Switched from Lipitor to [employer].  -Continue with Crestor 40mg  daily  Depression Signs of depression during admission -Lexapro 10mg  daily  HTN, stable Last BP 113/85 -Amlodipine 5mg  daily   BPH, chronic -Tamsulosin 0.4mg  daily  COVID19, resolved Afebrile, on RA and off precautions.   Placement Difficulty:  Patient has been homeless and is unable to get a bed at a SNF because of positive cocaine on UDS.  All homeless shelters are also full.  Have been in contact with patient's son, Linton Rump, to discuss if he will take him home and use home health for physical therapy and assistance.  Patient's son stated that he would have an answer hopefully by the end of the day.   FEN/GI: Heart healthy PPx: Lovenox Dispo:SNF. Barriers include placement. Pt has been homeless. Will have ongoing discussion with pt's son about placement in  snf vs. home with son.    Subjective:  Pt is doing well this morning and has no complaints.   Objective: Temp:  [97.4 F (36.3 C)-98.3 F (36.8 C)] 98.3 F (36.8 C) (08/16 1213) Pulse Rate:  [70-87] 83 (08/16 1213) Resp:  [13-22] 22 (08/16 1213) BP: (110-126)/(82-96) 112/96 (08/16 1213) SpO2:  [94 %-98 %] 94 % (08/16 1213)  Physical Exam: General: NAD, Resting in bed Cardiovascular: RRR, no murmurs Respiratory: CTAB, Normal WOB Abdomen: No TTP, Normal BS, soft, not distended  Extremities: Moving all extremities   Laboratory: Recent Labs  Lab 04/09/21 0329 04/10/21 0155  WBC 2.7* 3.3*  HGB 11.3* 11.6*  HCT 33.1* 35.4*  PLT 172 178   Recent Labs  Lab 04/09/21 0329 04/10/21 0155 04/12/21 0714 04/14/21 0349  NA 135 136  --   --   K 3.8 3.9  --   --   CL 103 105  --   --   CO2 23 23  --   --   BUN 13 13  --   --   CREATININE 0.99 1.04  --   --   CALCIUM 7.9* 8.2*  --   --   PROT 6.1* 6.6 7.6 7.8  BILITOT 0.4 0.5 0.7 0.7  ALKPHOS 51 63 80 90  ALT 33 49* 79* 60*  AST 77* 102* 108* 56*  GLUCOSE 90 99  --   --     Imaging/Diagnostic Tests: None new today  Erskine Emery, MD 04/15/2021, 1:23 PM PGY-1, [place] Intern pager: [phone], text pages welcome

## 2021-04-14 NOTE — Discharge Instructions (Addendum)
Dear David [name],  Thank you for letting us participate in your care. You were hospitalized for altered mental status and diagnosed with a stroke and COVID-19. You were treated with remdesivir, an anti-viral medication for your COVID and anti-platelet medication for your stroke.   POST-HOSPITAL & CARE INSTRUCTIONS Nursing staff, at the time of discharge please call to schedule an appointment for this patient with PCP.  We started you on a medication called amlodipine for your blood pressure, you will take this once per day. You are on two separate medications for your stroke: aspirin and Plavix. You will take the aspirin AND the Plavix every day until August 31. At that time, you will start taking ONLY the aspirin.  We started an anti-depressant called Lexapro. You will take this every day. Your primary doctor may adjust your dose of this at your follow-up appointment.  You will wear a cardiac monitor for 30 days after discharge. Doctor Croitoru will follow-up with you about this.  We are changing your cholesterol medication from Lipitor to a similar medication called Crestor. Go to your follow up appointments (listed below)   DOCTOR'S APPOINTMENT   Future Appointments  Date Time Provider [place]  07/04/2021 10:40 AM Croitoru, Dani Gobble, MD [facility]    Follow-up Information     [facility]. Schedule an appointment as soon as possible for a visit in 1 month(s).   Specialty: Neurology Why: stroke clinic Contact information: [address] [place] [phone]                Take care and be well!  [facility]  [place], [place] [address] [phone]

## 2021-04-15 DIAGNOSIS — N186 End stage renal disease: Secondary | ICD-10-CM | POA: Diagnosis not present

## 2021-04-15 DIAGNOSIS — Z5900 Homelessness unspecified: Secondary | ICD-10-CM | POA: Diagnosis not present

## 2021-04-15 DIAGNOSIS — Z992 Dependence on renal dialysis: Secondary | ICD-10-CM | POA: Diagnosis not present

## 2021-04-15 NOTE — TOC Progression Note (Addendum)
Transition of Care [facility]) - Progression Note    Patient Details  Name: David [name] MRN: [id number] Date of Birth: 05-14-1956  Transition of Care [facility]) CM/SW [place], [place] Phone Number: 04/15/2021, 3:35 PM  Clinical Narrative:   CSW following for disposition. Patient continues to have no bed offers for SNF. CSW updated by MD that patient's son, David [name], was considering taking the patient home. CSW contacted David [name] and spoke on the phone. David [name] indicated that the patient has five kids, but nobody has extra rooms; everyone is in one or two bedroom homes or apartments, nobody has an extra room available for the patient. In addition, all of the children work full time and have kids, so they would not be able to provide 24/7 assist for the patient, either. CSW discussed some of the patient's care needs at this time, and David [name] indicated that they wouldn't be able to meet those needs. CSW discussed with David [name] the barriers to SNF placement, and also how the patient is not safe to discharge to the street in his current state. David [name] indicated understanding and appreciated the update. There is still no safe disposition for the patient at this time. CSW to continue to follow.  UPDATE 4:29 PM: CSW sent patient information to [facility] Supervisor to review for difficult to place team. CSW to follow.    Expected Discharge Plan: Homeless Shelter Barriers to Discharge: Continued Medical Work up, Ship broker, Homeless with medical needs, SNF Covid Recovering, Active Substance Use - Placement, Family Issues  Expected Discharge Plan and Services Expected Discharge Plan: Homeless Shelter       Living arrangements for the past 2 months: Homeless                                       Social Determinants of Health (SDOH) Interventions    Readmission Risk Interventions No flowsheet data found.

## 2021-04-15 NOTE — Progress Notes (Signed)
FPTS Brief Progress Note  S Saw patient at bedside this evening. Patient denies any concerns.   O: BP 122/83 (BP Location: Right Arm)   Pulse 93   Temp 97.6 F (36.4 C) (Oral)   Resp 20   Ht 5\' 10"  (1.778 m)   Wt 68 kg   SpO2 99%   BMI 21.51 kg/m    General: Alert, no acute distress, comfortable  Cardio: well perfused  Pulm: normal work of breathing Neuro: Cranial nerves grossly intact   A/P: Plan per day team  Waiting for placement  -Orders reviewed. Labs for AM not ordered, which was adjusted as needed.   Lattie Haw, MD 04/15/2021, 10:09 PM PGY-3, [facility] Family Medicine Night Resident  Please page [phone] with questions.

## 2021-04-15 NOTE — Progress Notes (Signed)
FPTS Interim Night Progress Note  S:Patient sleeping comfortably.  Rounded with primary night RN.  No concerns voiced.  No orders required.    O: Today's Vitals   04/14/21 1215 04/14/21 2027 04/15/21 0014 04/15/21 0413  BP: 105/77 125/85 (!) 126/92 113/85  Pulse: 87 87 85 77  Resp: 20 20 18 15   Temp: 98.9 F (37.2 C) 97.8 F (36.6 C) 98.2 F (36.8 C) 98.2 F (36.8 C)  TempSrc: Oral Oral Oral Oral  SpO2: 97% 97% 98% 97%  Weight:      Height:      PainSc:  0-No pain      A/P: Continue current management  Carollee Leitz MD PGY-3, [place] Medicine Service pager [phone]

## 2021-04-16 DIAGNOSIS — I6330 Cerebral infarction due to thrombosis of unspecified cerebral artery: Secondary | ICD-10-CM | POA: Diagnosis not present

## 2021-04-16 DIAGNOSIS — Z992 Dependence on renal dialysis: Secondary | ICD-10-CM | POA: Diagnosis not present

## 2021-04-16 DIAGNOSIS — Z5900 Homelessness unspecified: Secondary | ICD-10-CM | POA: Diagnosis not present

## 2021-04-16 DIAGNOSIS — N186 End stage renal disease: Secondary | ICD-10-CM | POA: Diagnosis not present

## 2021-04-16 NOTE — Progress Notes (Addendum)
Family Medicine Teaching Service Daily Progress Note Intern Pager: [phone]  Patient name: David [name] Medical record number: [id number] Date of birth: January 30, 1956 Age: 65 y.o. Gender: male  Primary Care Provider: Kinnie Feil, MD Consultants: Neuro (s/o), ID (s/o) Code Status: Full   Pt Overview and Major Events to Date:  8/7: Admitted to FPTS  Assessment and Plan:  Mr. Buresh is a 65yo male who presented with AMS and sepsis secondary to COVD-19 infection, now with acute vs subacute frontal CVA. Pt has a hx of CVA in July 2022, HLD, HTN, prediabetes, polysubstance abuse(UDS + for cocaine on this admission). CVA, acute to subacute small vessel infarcts in the left corona radiata and right frontal white matter. Pt is medically stable for discharge.   Difficult Disposition  Patient is homeless. Per SW, all homeless shelters are full. The search for a SNF has been difficult as the patient had a positive UDS test for cocaine. Pt does have a son however the son expressed that it is not feasible for patient to live with him at this time. We appreciate the efforts of our SW team and their assistance in finding a safe disposition for this patient.   Acute to subacute frontal CVA Stable. Pt has returned to baseline.  - Continue ASA, Plavix, crestor  -30 cardiac event monitoring, Dr. Sallyanne Kuster to follow   Acute Hepatitis Transaminases continue to down trend after switch from Lipitor to Crestor. AST 56, ALT 60.  - weekly CMP : next 8/18  Depression, stable -Lexapro 10mg  daily  HTN, stable Normotensive.  -Amlodipine 5mg   BPH, chronic, stable -Tamsulosin 0.4mg  daily  Resolved hospital problems  Acute Encephalopathy, resolved COVID-19, resolved - Pt remains afebrile and stable on RA. Precautions no longer required.      FEN/GI: Heart healthy  PPx: Lovenox Dispo:SNF  pending finding placement .   Subjective:  No significant overnight events.   Objective: Temp:  [97.4 F (36.3  C)-98.6 F (37 C)] 98.3 F (36.8 C) (08/17 0414) Pulse Rate:  [70-93] 93 (08/17 0414) Resp:  [13-22] 20 (08/17 0414) BP: (110-122)/(82-96) 114/90 (08/17 0414) SpO2:  [94 %-100 %] 99 % (08/17 0414)  Physical Exam:  GEN: well appearing male, resting in bed in no acute distress  CV: regular rate and rhythm RESP: no increased work of breathing, clear to ascultation bilaterally with no crackles, wheezes, or rhonchi  ABD: Soft, Nontender, Nondistended.  MSK: no LE edema, or calf tenderness  SKIN: warm, dry    Laboratory: Recent Labs  Lab 04/10/21 0155  WBC 3.3*  HGB 11.6*  HCT 35.4*  PLT 178   Recent Labs  Lab 04/10/21 0155 04/12/21 0714 04/14/21 0349  NA 136  --   --   K 3.9  --   --   CL 105  --   --   CO2 23  --   --   BUN 13  --   --   CREATININE 1.04  --   --   CALCIUM 8.2*  --   --   PROT 6.6 7.6 7.8  BILITOT 0.5 0.7 0.7  ALKPHOS 63 80 90  ALT 49* 79* 60*  AST 102* 108* 56*  GLUCOSE 99  --   --     Imaging/Diagnostic Tests: No results found.   Lyndee Hensen, DO 04/16/2021, 7:39 AM PGY-3, [place] Intern pager: [phone], text pages welcome

## 2021-04-16 NOTE — Progress Notes (Addendum)
FPTS Brief Progress Note  S Saw patient at bedside this evening. Patient was watching tv. No concerns.  O: BP (!) 123/97 (BP Location: Right Arm)   Pulse 84   Temp 98.9 F (37.2 C) (Oral)   Resp 20   Ht 5\' 10"  (1.778 m)   Wt 68 kg   SpO2 99%   BMI 21.51 kg/m    General: Alert, no acute distress, comfortable   A/P: Plan per day team  -Pending placement - Orders reviewed. Labs for AM ordered, which was adjusted as needed.   Lattie Haw, MD 04/16/2021, 10:57 PM PGY-3, [place] Family Medicine Night Resident  Please page [phone] with questions.

## 2021-04-16 NOTE — Progress Notes (Signed)
Occupational Therapy Treatment Patient Details Name: David [name] MRN: [id number] DOB: Mar 24, 1956 Today's Date: 04/16/2021    History of present illness Pt is a 65 y.o. male who presented 04/05/21 with AMS and pt falling. MRI revealed acute to subacute small vessel infarcts in the left corona  radiata and right frontal white matter. UDS positive for cocaine. Pt with AKI and sepsis 2/2 COVID infection. Patient was recently admitted from 03/13/21-03/15/21 for new CVA. PMH: CVA, falls, hyperlipidemia, hypertension, prediabetes, BPH, substance use disorder.   OT comments  Patient with fair progress toward patient goals.  He was again found with loose stool, and appeared unaware he had a BM.  Patient brought to bedside commode with hand held Min A, completed lower body ADL with Min A, and upper body ADL with setup and cues for sequencing.  Peri care Mod A for thoroughness.  Deficits to independence continue, and are listed below.  OT to continue efforts in the acute setting to maximize her functional status.  He continues to need 24 hour assist as needed post acute.    Follow Up Recommendations  Supervision/Assistance - 24 hour    Equipment Recommendations  Other (comment)    Recommendations for Other Services      Precautions / Restrictions Precautions Precautions: Fall Precaution Comments: off airborne Restrictions Weight Bearing Restrictions: No       Mobility Bed Mobility Overal bed mobility: Needs Assistance Bed Mobility: Supine to Sit;Sit to Supine     Supine to sit: Supervision Sit to supine: Supervision     Patient Response: Flat affect  Transfers Overall transfer level: Needs assistance   Transfers: Sit to/from Stand Sit to Stand: Min assist              Balance Overall balance assessment: Needs assistance Sitting-balance support: Feet supported Sitting balance-Leahy Scale: Fair     Standing balance support: Single extremity supported Standing balance-Leahy  Scale: Poor Standing balance comment: reliant on UE support and external assist                           ADL either performed or assessed with clinical judgement   ADL Overall ADL's : Needs assistance/impaired     Grooming: Oral care;Min guard;Standing;Wash/dry hands;Wash/dry face;Sitting   Upper Body Bathing: Supervision/ safety;Cueing for sequencing   Lower Body Bathing: Minimal assistance;Sit to/from stand;Cueing for sequencing   Upper Body Dressing : Minimal assistance;Sitting   Lower Body Dressing: Minimal assistance;Sit to/from stand Lower Body Dressing Details (indicate cue type and reason): difficulty placing R sock             Functional mobility during ADLs: Minimal assistance General ADL Comments: HHA     Vision Patient Visual Report: No change from baseline                Cognition Arousal/Alertness: Lethargic Behavior During Therapy: Flat affect Overall Cognitive Status: No family/caregiver present to determine baseline cognitive functioning                         Following Commands: Follows one step commands consistently Safety/Judgement: Decreased awareness of safety;Decreased awareness of deficits   Problem Solving: Slow processing;Decreased initiation;Requires verbal cues                            Pertinent Vitals/ Pain       Pain Assessment: No/denies pain Pain  Intervention(s): Monitored during session                                                          Frequency  Min 2X/week        Progress Toward Goals  OT Goals(current goals can now be found in the care plan section)  Progress towards OT goals: Progressing toward goals  Acute Rehab OT Goals Patient Stated Goal: not stated OT Goal Formulation: With patient Time For Goal Achievement: 04/20/21 Potential to Achieve Goals: [place] Discharge plan remains appropriate    Co-evaluation                  AM-PAC OT "6 Clicks" Daily Activity     Outcome Measure   Help from another person eating meals?: None Help from another person taking care of personal grooming?: None Help from another person toileting, which includes using toliet, bedpan, or urinal?: A Little Help from another person bathing (including washing, rinsing, drying)?: A Lot Help from another person to put on and taking off regular upper body clothing?: A Little Help from another person to put on and taking off regular lower body clothing?: A Lot 6 Click Score: 18    End of Session    OT Visit Diagnosis: Unsteadiness on feet (R26.81);Repeated falls (R29.6);Muscle weakness (generalized) (M62.81);Other symptoms and signs involving cognitive function;Hemiplegia and hemiparesis Hemiplegia - Right/Left: Right Hemiplegia - dominant/non-dominant: Non-Dominant Hemiplegia - caused by: Cerebral infarction   Activity Tolerance Patient tolerated treatment well   Patient Left in bed;with call bell/phone within reach;with bed alarm set   Nurse Communication Other (comment) (patient with loose stool in bed)        Time: 4765-4650 OT Time Calculation (min): 26 min  Charges: OT General Charges $OT Visit: 1 Visit OT Treatments $Self Care/Home Management : 23-37 mins  04/16/2021  Rich, OTR/L  Acute Rehabilitation Services  Office:  [phone]    Metta Clines 04/16/2021, 11:41 AM

## 2021-04-16 NOTE — Progress Notes (Signed)
Physical Therapy Treatment Patient Details Name: David [name] MRN: [id number] DOB: October 09, 1955 Today's Date: 04/16/2021    History of Present Illness Pt is a 65 y.o. male who presented 04/05/21 with AMS and pt falling. MRI revealed acute to subacute small vessel infarcts in the left corona  radiata and right frontal white matter. UDS positive for cocaine. Pt with AKI and sepsis 2/2 COVID infection. Patient was recently admitted from 03/13/21-03/15/21 for new CVA. PMH: CVA, falls, hyperlipidemia, hypertension, prediabetes, BPH, substance use disorder.    PT Comments    Patient continues to be limited by decreased activity tolerance. Patient ambulated 27' with RW and minA, 2/4 DOE at end of ambulation. Patient performed sit to stand x 10 and standing marching with min guard. Patient continues to be disoriented to situation. Continue to recommend SNF for ongoing Physical Therapy.       Follow Up Recommendations  SNF;Supervision/Assistance - 24 hour     Equipment Recommendations  Rolling Abshir Paolini with 5" wheels    Recommendations for Other Services       Precautions / Restrictions Precautions Precautions: Fall Precaution Comments: off airborne Restrictions Weight Bearing Restrictions: No    Mobility  Bed Mobility Overal bed mobility: Needs Assistance Bed Mobility: Supine to Sit;Sit to Supine     Supine to sit: Min assist Sit to supine: Supervision   General bed mobility comments: minA for trunk elevation due to difficulty sequencing    Transfers Overall transfer level: Needs assistance Equipment used: Rolling Shayanna Thatch (2 wheeled) Transfers: Sit to/from Stand Sit to Stand: Min assist;Min guard         General transfer comment: initially requiring minA to rise and steady. During repeated sit to stands, required min guard for safety  Ambulation/Gait Ambulation/Gait assistance: Min assist Gait Distance (Feet): 40 Feet Assistive device: Rolling Gatsby Chismar (2 wheeled) Gait  Pattern/deviations: Step-through pattern;Decreased stance time - right;Decreased stride length;Decreased weight shift to right;Narrow base of support;Trunk flexed Gait velocity: decreased   General Gait Details: decreased stance time on R LE with patient reporting aching pain throughout LE. MinA for balance especially during turns. Cues for RW proximity and increasing step length. 2/4 DOE   Marine scientist Rankin (Stroke Patients Only) Modified Rankin (Stroke Patients Only) Pre-Morbid Rankin Score: Slight disability Modified Rankin: Moderately severe disability     Balance Overall balance assessment: Needs assistance Sitting-balance support: Feet supported Sitting balance-Leahy Scale: Fair     Standing balance support: Bilateral upper extremity supported Standing balance-Leahy Scale: Poor Standing balance comment: reliant on UE support and external assist                            Cognition Arousal/Alertness: Awake/alert Behavior During Therapy: Flat affect Overall Cognitive Status: No family/caregiver present to determine baseline cognitive functioning Area of Impairment: Attention;Following commands;Safety/judgement;Awareness;Problem solving;Orientation                 Orientation Level: Disoriented to;Situation Current Attention Level: Selective   Following Commands: Follows one step commands consistently Safety/Judgement: Decreased awareness of safety;Decreased awareness of deficits Awareness: Emergent Problem Solving: Slow processing;Decreased initiation;Requires verbal cues General Comments: Disoriented to situation and states "I'm here for therapy"      Exercises Other Exercises Other Exercises: sit to stand x 10 from low bed surface Other Exercises: standing marching with B UE support    General Comments  Pertinent Vitals/Pain Pain Assessment: Faces Faces Pain Scale: Hurts little  more Pain Location: R LE Pain Descriptors / Indicators: Aching;Discomfort Pain Intervention(s): Monitored during session;Repositioned    Home Living                      Prior Function            PT Goals (current goals can now be found in the care plan section) Acute Rehab PT Goals Patient Stated Goal: not stated PT Goal Formulation: With patient Time For Goal Achievement: 04/20/21 Potential to Achieve Goals: Good Progress towards PT goals: Progressing toward goals    Frequency    Min 3X/week      PT Plan Current plan remains appropriate    Co-evaluation              AM-PAC PT "6 Clicks" Mobility   Outcome Measure  Help needed turning from your back to your side while in a flat bed without using bedrails?: A Little Help needed moving from lying on your back to sitting on the side of a flat bed without using bedrails?: A Little Help needed moving to and from a bed to a chair (including a wheelchair)?: A Little Help needed standing up from a chair using your arms (e.g., wheelchair or bedside chair)?: A Little Help needed to walk in hospital room?: A Little Help needed climbing 3-5 steps with a railing? : A Little 6 Click Score: 18    End of Session Equipment Utilized During Treatment: Gait belt Activity Tolerance: Patient limited by fatigue Patient left: in bed;with call bell/phone within reach;with bed alarm set Nurse Communication: Mobility status PT Visit Diagnosis: Unsteadiness on feet (R26.81);Repeated falls (R29.6);History of falling (Z91.81);Muscle weakness (generalized) (M62.81);Hemiplegia and hemiparesis;Difficulty in walking, not elsewhere classified (R26.2);Other abnormalities of gait and mobility (R26.89);Other symptoms and signs involving the nervous system (R29.898);Apraxia (R48.2) Hemiplegia - Right/Left: Right Hemiplegia - dominant/non-dominant: Dominant Hemiplegia - caused by: Cerebral infarction     Time: 1147-1203 PT Time  Calculation (min) (ACUTE ONLY): 16 min  Charges:  $Gait Training: 8-22 mins                     Swayze Pries A. Gilford Rile PT, DPT Acute Rehabilitation Services Pager [phone] Office [phone]    Linna Hoff 04/16/2021, 12:18 PM

## 2021-04-16 NOTE — Plan of Care (Signed)
  Problem: Education: Goal: Knowledge of disease or condition will improve Outcome: Progressing Goal: Knowledge of secondary prevention will improve Outcome: Progressing Goal: Knowledge of patient specific risk factors addressed and post discharge goals established will improve Outcome: Progressing   Problem: Coping: Goal: Will verbalize positive feelings about self Outcome: Progressing Goal: Will identify appropriate support needs Outcome: Progressing   Problem: Health Behavior/Discharge Planning: Goal: Ability to manage health-related needs will improve Outcome: Progressing   Problem: Self-Care: Goal: Ability to participate in self-care as condition permits will improve Outcome: Progressing Goal: Verbalization of feelings and concerns over difficulty with self-care will improve Outcome: Progressing Goal: Ability to communicate needs accurately will improve Outcome: Progressing   Problem: Nutrition: Goal: Risk of aspiration will decrease Outcome: Progressing Goal: Dietary intake will improve Outcome: Progressing   Problem: Intracerebral Hemorrhage Tissue Perfusion: Goal: Complications of Intracerebral Hemorrhage will be minimized Outcome: Progressing   Problem: Ischemic Stroke/TIA Tissue Perfusion: Goal: Complications of ischemic stroke/TIA will be minimized Outcome: Progressing   Problem: Spontaneous Subarachnoid Hemorrhage Tissue Perfusion: Goal: Complications of Spontaneous Subarachnoid Hemorrhage will be minimized Outcome: Progressing   Problem: Education: Goal: Knowledge of General Education information will improve Description: Including pain rating scale, medication(s)/side effects and non-pharmacologic comfort measures Outcome: Progressing   Problem: Health Behavior/Discharge Planning: Goal: Ability to manage health-related needs will improve Outcome: Progressing   Problem: Clinical Measurements: Goal: Ability to maintain clinical measurements within  normal limits will improve Outcome: Progressing Goal: Will remain free from infection Outcome: Progressing Goal: Diagnostic test results will improve Outcome: Progressing Goal: Respiratory complications will improve Outcome: Progressing Goal: Cardiovascular complication will be avoided Outcome: Progressing   Problem: Activity: Goal: Risk for activity intolerance will decrease Outcome: Progressing   Problem: Nutrition: Goal: Adequate nutrition will be maintained Outcome: Progressing   Problem: Coping: Goal: Level of anxiety will decrease Outcome: Progressing   Problem: Elimination: Goal: Will not experience complications related to bowel motility Outcome: Progressing Goal: Will not experience complications related to urinary retention Outcome: Progressing   Problem: Pain Managment: Goal: General experience of comfort will improve Outcome: Progressing   Problem: Safety: Goal: Ability to remain free from injury will improve Outcome: Progressing   Problem: Skin Integrity: Goal: Risk for impaired skin integrity will decrease Outcome: Progressing   

## 2021-04-17 DIAGNOSIS — I6330 Cerebral infarction due to thrombosis of unspecified cerebral artery: Secondary | ICD-10-CM | POA: Diagnosis not present

## 2021-04-17 LAB — COMPREHENSIVE METABOLIC PANEL
ALT: 33 U/L (ref 0–44)
AST: 27 U/L (ref 15–41)
Albumin: 3 g/dL — ABNORMAL LOW (ref 3.5–5.0)
Alkaline Phosphatase: 88 U/L (ref 38–126)
Anion gap: 9 (ref 5–15)
BUN: 15 mg/dL (ref 8–23)
CO2: 22 mmol/L (ref 22–32)
Calcium: 8.6 mg/dL — ABNORMAL LOW (ref 8.9–10.3)
Chloride: 102 mmol/L (ref 98–111)
Creatinine, Ser: 0.91 mg/dL (ref 0.61–1.24)
GFR, Estimated: >60 mL/min (ref >60)
Glucose, Bld: 112 mg/dL — ABNORMAL HIGH (ref 70–99)
Potassium: 3.8 mmol/L (ref 3.5–5.1)
Sodium: 133 mmol/L — ABNORMAL LOW (ref 135–145)
Total Bilirubin: 0.6 mg/dL (ref 0.3–1.2)
Total Protein: 8 g/dL (ref 6.5–8.1)

## 2021-04-17 NOTE — Progress Notes (Signed)
FPTS Brief Progress Note  S Saw patient at bedside this evening. Patient denies concerns.  O: BP 126/85 (BP Location: Right Arm)   Pulse 86   Temp 98.7 F (37.1 C) (Oral)   Resp 20   Ht 5\' 10"  (1.778 m)   Wt 68 kg   SpO2 99%   BMI 21.51 kg/m    General: resting comfortably   A/P: Plan per day team  -Pending placement  -Orders reviewed. Labs for AM ordered, which was adjusted as needed.    Lattie Haw, MD 04/17/2021, 9:31 PM PGY-3, [facility] Night Resident  Please page [phone] with questions.

## 2021-04-17 NOTE — Progress Notes (Addendum)
Family Medicine Teaching Service Daily Progress Note Intern Pager: [phone]  Patient name: David [name] Medical record number: [id number] Date of birth: 11-Jun-1956 Age: 65 y.o. Gender: male  Primary Care Provider: Kinnie Feil, MD Consultants: Neuro (s/o), ID (s/o) Code Status: Full  Pt Overview and Major Events to Date:  8/7: Admitted to FPTS    Assessment and Plan:  Mr. Jacober is a 65yo male who presented with AMS and sepsis secondary to COVID-19 infection, now with acute vs subacute frontal CVA. Pt has a hx of CVA in July 2022, HLD, HTN, prediabetes, polysubstance abuse(UDS + for cocaine on this admission). CVA, acute to subacute small vessel infarcts in the left corona radiata and right frontal white matter. Pt is medically stable for discharge.   Difficult Disposition  Pt has been homeless, and per SW, are homeless shelters are full. Difficulty finding a SNF due to positive cocaine on UDS. Pt has children, but none are able to take him for various reasons.  Have been in contact with social work today, still cannot find placement. -SW has been following closely and are currently looking for placement.  -Repeat UDS  Acute to subacute front CVA Returned to baseline  -Continue ASA, Plavix, crestor  -30 day cardiac event monitoring, Dr. Sallyanne Kuster to follow   Acute Hepatitis LFTs have downtrended after switch to Crestor from Lipitor  -AST 56>27, ALT>60>33 -CMP: Ca 8.6, other electrolytes wnl -Labwork every Thursday  Depression, stable -Lexapro 10mg  daily   HTN, stable Normotensive.  -Amlodipine 5mg    BPH, chronic, stable -Tamsulosin 0.4mg  daily   Resolved hospital problems  Acute Encephalopathy, resolved COVID-19, resolved - Pt remains afebrile and stable on RA. Precautions no longer required.     FEN/GI: Heart Healthy PPx: Lovenox Dispo:Pending finding placement   Subjective:  Pt is doing well today and has no complaints.  He understands that he is waiting for  placement with SNF.  Objective: Temp:  [97.7 F (36.5 C)-98.9 F (37.2 C)] 98.5 F (36.9 C) (08/18 0319) Pulse Rate:  [80-98] 90 (08/18 0319) Resp:  [16-21] 21 (08/18 0319) BP: (118-127)/(78-97) 127/84 (08/18 0319) SpO2:  [99 %-100 %] 100 % (08/18 0319) Physical Exam: General: NAD, well appearing, resting in bed  Cardiovascular: RRR, no murmurs Respiratory: Normal WOB, CTAB Abdomen: soft, nontender, nondistended  Extremities: Moving extremities  Laboratory: No results for input(s): WBC, HGB, HCT, PLT in the last 168 hours. Recent Labs  Lab 04/12/21 0714 04/14/21 0349 04/17/21 0530  NA  --   --  133*  K  --   --  3.8  CL  --   --  102  CO2  --   --  22  BUN  --   --  15  CREATININE  --   --  0.91  CALCIUM  --   --  8.6*  PROT 7.6 7.8 8.0  BILITOT 0.7 0.7 0.6  ALKPHOS 80 90 88  ALT 79* 60* 33  AST 108* 56* 27  GLUCOSE  --   --  112*    Imaging/Diagnostic Tests: None   Erskine Emery, MD 04/17/2021, 8:55 AM PGY-1, [facility] Intern pager: [phone], text pages welcome

## 2021-04-18 DIAGNOSIS — I6330 Cerebral infarction due to thrombosis of unspecified cerebral artery: Secondary | ICD-10-CM | POA: Diagnosis not present

## 2021-04-18 LAB — RAPID URINE DRUG SCREEN, HOSP PERFORMED
Amphetamines: NOT DETECTED
Barbiturates: NOT DETECTED
Benzodiazepines: NOT DETECTED
Cocaine: NOT DETECTED
Opiates: NOT DETECTED
Tetrahydrocannabinol: NOT DETECTED

## 2021-04-18 MED ORDER — ESCITALOPRAM OXALATE 10 MG PO TABS
20.0000 mg | ORAL_TABLET | Freq: Every day | ORAL | Status: DC
  Administered 2021-04-19 – 2021-04-25 (×7): 20 mg via ORAL
  Filled 2021-04-18 (×7): qty 2

## 2021-04-18 NOTE — Plan of Care (Signed)
  Problem: Education: Goal: Knowledge of disease or condition will improve Outcome: Progressing Goal: Knowledge of secondary prevention will improve Outcome: Progressing Goal: Knowledge of patient specific risk factors addressed and post discharge goals established will improve Outcome: Progressing   Problem: Coping: Goal: Will verbalize positive feelings about self Outcome: Progressing Goal: Will identify appropriate support needs Outcome: Progressing   Problem: Health Behavior/Discharge Planning: Goal: Ability to manage health-related needs will improve Outcome: Progressing   Problem: Self-Care: Goal: Ability to participate in self-care as condition permits will improve Outcome: Progressing Goal: Verbalization of feelings and concerns over difficulty with self-care will improve Outcome: Progressing Goal: Ability to communicate needs accurately will improve Outcome: Progressing

## 2021-04-18 NOTE — Progress Notes (Signed)
FPTS Brief Progress Note  S Saw patient at bedside this evening. Patient was sleeping comfortably. I did not wake the patient.  O: BP 120/84 (BP Location: Right Arm)   Pulse 81   Temp 98 F (36.7 C) (Oral)   Resp (!) 22   Ht 5\' 10"  (1.778 m)   Wt 64.2 kg   SpO2 99%   BMI 20.31 kg/m    General: Alert, no acute distress  A/P: Plan per day team  Medically stable for discharge, pending placement - Orders reviewed. Labs for AM ordered, which was adjusted as needed.   Lattie Haw, MD 04/18/2021, 10:17 PM PGY-3, [facility] Night Resident  Please page [phone] with questions.

## 2021-04-18 NOTE — Progress Notes (Signed)
Physical Therapy Treatment Patient Details Name: David [name] MRN: [id number] DOB: 1955/12/25 Today's Date: 04/18/2021    History of Present Illness Pt is a 65 y.o. male who presented 04/05/21 with AMS and pt falling. MRI revealed acute to subacute small vessel infarcts in the left corona  radiata and right frontal white matter. UDS positive for cocaine. Pt with AKI and sepsis 2/2 COVID infection. Patient was recently admitted from 03/13/21-03/15/21 for new CVA. PMH: CVA, falls, hyperlipidemia, hypertension, prediabetes, BPH, substance use disorder.    PT Comments    Patient increasing ambulation distance this session with min guard mainly. Patient with LOB x 1 requiring minA to recover. Patient continues to be limited by fatigue with minimal activity. Patient requesting to terminate session following mobility. Continue to recommend SNF for ongoing Physical Therapy.       Follow Up Recommendations  SNF;Supervision/Assistance - 24 hour     Equipment Recommendations  Rolling Dashan Chizmar with 5" wheels    Recommendations for Other Services       Precautions / Restrictions Precautions Precautions: Fall Restrictions Weight Bearing Restrictions: No    Mobility  Bed Mobility Overal bed mobility: Needs Assistance Bed Mobility: Supine to Sit;Sit to Supine     Supine to sit: Supervision Sit to supine: Min assist   General bed mobility comments: supervision for safety. MinA to return R LE to bed    Transfers Overall transfer level: Needs assistance Equipment used: Rolling Donne Baley (2 wheeled) Transfers: Sit to/from Stand Sit to Stand: Min guard         General transfer comment: increased time required. Min guard for safety  Ambulation/Gait Ambulation/Gait assistance: Min guard;Min assist Gait Distance (Feet): 150 Feet Assistive device: Rolling Demaria Deeney (2 wheeled) Gait Pattern/deviations: Step-to pattern;Decreased stride length;Decreased dorsiflexion - right;Decreased weight shift to  right;Decreased stance time - right;Narrow base of support;Trunk flexed Gait velocity: decreased   General Gait Details: min guard for ambulation throughout. Patient with LOB x 1 requiring minA to recover. Cues for increasing step length on R   Stairs             Wheelchair Mobility    Modified Rankin (Stroke Patients Only) Modified Rankin (Stroke Patients Only) Pre-Morbid Rankin Score: Slight disability Modified Rankin: Moderately severe disability     Balance Overall balance assessment: Needs assistance Sitting-balance support: Feet supported Sitting balance-Leahy Scale: Fair     Standing balance support: Bilateral upper extremity supported Standing balance-Leahy Scale: Poor Standing balance comment: reliant on UE support and external assist                            Cognition Arousal/Alertness: Awake/alert Behavior During Therapy: Flat affect Overall Cognitive Status: Impaired/Different from baseline Area of Impairment: Attention;Following commands;Safety/judgement;Awareness;Problem solving;Orientation                 Orientation Level: Disoriented to;Situation Current Attention Level: Sustained Memory: Decreased short-term memory Following Commands: Follows one step commands consistently Safety/Judgement: Decreased awareness of safety;Decreased awareness of deficits Awareness: Emergent Problem Solving: Slow processing;Decreased initiation;Requires verbal cues General Comments: flat affect throughout. Easily fatigue. Limited conversation.      Exercises      General Comments        Pertinent Vitals/Pain Pain Assessment: No/denies pain    Home Living                      Prior Function  PT Goals (current goals can now be found in the care plan section) Acute Rehab PT Goals Patient Stated Goal: not stated PT Goal Formulation: With patient Time For Goal Achievement: 04/20/21 Potential to Achieve Goals:  Good Progress towards PT goals: Progressing toward goals    Frequency    Min 3X/week      PT Plan Current plan remains appropriate    Co-evaluation              AM-PAC PT "6 Clicks" Mobility   Outcome Measure  Help needed turning from your back to your side while in a flat bed without using bedrails?: A Little Help needed moving from lying on your back to sitting on the side of a flat bed without using bedrails?: A Little Help needed moving to and from a bed to a chair (including a wheelchair)?: A Little Help needed standing up from a chair using your arms (e.g., wheelchair or bedside chair)?: A Little Help needed to walk in hospital room?: A Little Help needed climbing 3-5 steps with a railing? : A Little 6 Click Score: 18    End of Session Equipment Utilized During Treatment: Gait belt Activity Tolerance: Patient limited by fatigue Patient left: in bed;with call bell/phone within reach;with bed alarm set Nurse Communication: Mobility status PT Visit Diagnosis: Unsteadiness on feet (R26.81);Repeated falls (R29.6);History of falling (Z91.81);Muscle weakness (generalized) (M62.81);Hemiplegia and hemiparesis;Difficulty in walking, not elsewhere classified (R26.2);Other abnormalities of gait and mobility (R26.89);Other symptoms and signs involving the nervous system (R29.898);Apraxia (R48.2) Hemiplegia - Right/Left: Right Hemiplegia - dominant/non-dominant: Dominant Hemiplegia - caused by: Cerebral infarction     Time: 1557-1611 PT Time Calculation (min) (ACUTE ONLY): 14 min  Charges:  $Gait Training: 8-22 mins                     Atiba Kimberlin A. Gilford Rile PT, DPT Acute Rehabilitation Services Pager [phone] Office [phone]    Linna Hoff 04/18/2021, 5:31 PM

## 2021-04-18 NOTE — Progress Notes (Signed)
Occupational Therapy Treatment Patient Details Name: David [name] MRN: [id number] DOB: 1956-02-09 Today's Date: 04/18/2021    History of present illness Pt is a 65 y.o. male who presented 04/05/21 with AMS and pt falling. MRI revealed acute to subacute small vessel infarcts in the left corona  radiata and right frontal white matter. UDS positive for cocaine. Pt with AKI and sepsis 2/2 COVID infection. Patient was recently admitted from 03/13/21-03/15/21 for new CVA. PMH: CVA, falls, hyperlipidemia, hypertension, prediabetes, BPH, substance use disorder.   OT comments  Pt cooperative, willing to work on grooming at sink, walking in room with RW and remained in chair at end of session. Pt answering questions and following commands, but not conversing. Requiring min assist for all activities.   Follow Up Recommendations  Supervision/Assistance - 24 hour    Equipment Recommendations  3 in 1 bedside commode    Recommendations for Other Services      Precautions / Restrictions Precautions Precautions: Fall       Mobility Bed Mobility Overal bed mobility: Needs Assistance Bed Mobility: Supine to Sit     Supine to sit: Supervision     General bed mobility comments: no physical assist needed    Transfers Overall transfer level: Needs assistance Equipment used: Rolling walker (2 wheeled) Transfers: Sit to/from Stand Sit to Stand: Min guard         General transfer comment: cues for hand placement    Balance Overall balance assessment: Needs assistance Sitting-balance support: Feet supported Sitting balance-Leahy Scale: Fair     Standing balance support: Bilateral upper extremity supported Standing balance-Leahy Scale: Poor                             ADL either performed or assessed with clinical judgement   ADL Overall ADL's : Needs assistance/impaired     Grooming: Oral care;Sitting;Wash/dry face;Min guard           Upper Body Dressing : Minimal  assistance;Sitting                   Functional mobility during ADLs: Minimal assistance;Rolling walker       Vision       Perception     Praxis      Cognition Arousal/Alertness: Awake/alert Behavior During Therapy: WFL for tasks assessed/performed Overall Cognitive Status: Difficult to assess                                 General Comments: pt minimally conversive, followed commands, slow processing, appeared to appreciate humor        Exercises     Shoulder Instructions       General Comments      Pertinent Vitals/ Pain       Pain Assessment: No/denies pain  Home Living                                          Prior Functioning/Environment              Frequency  Min 2X/week        Progress Toward Goals  OT Goals(current goals can now be found in the care plan section)  Progress towards OT goals: Progressing toward goals  Acute Rehab OT Goals Patient Stated Goal:  not stated OT Goal Formulation: With patient Time For Goal Achievement: 04/20/21 Potential to Achieve Goals: [place]  Plan Discharge plan remains appropriate    Co-evaluation                 AM-PAC OT "6 Clicks" Daily Activity     Outcome Measure   Help from another person eating meals?: None Help from another person taking care of personal grooming?: A Little Help from another person toileting, which includes using toliet, bedpan, or urinal?: A Little Help from another person bathing (including washing, rinsing, drying)?: A Lot Help from another person to put on and taking off regular upper body clothing?: A Little Help from another person to put on and taking off regular lower body clothing?: A Lot 6 Click Score: 17    End of Session Equipment Utilized During Treatment: Rolling walker;Gait belt  OT Visit Diagnosis: Unsteadiness on feet (R26.81);Repeated falls (R29.6);Muscle weakness (generalized) (M62.81);Other symptoms and signs  involving cognitive function;Hemiplegia and hemiparesis Hemiplegia - caused by: Cerebral infarction   Activity Tolerance Patient tolerated treatment well   Patient Left in chair;with call bell/phone within reach;with chair alarm set   Nurse Communication          Time: 1505-6979 OT Time Calculation (min): 25 min  Charges: OT General Charges $OT Visit: 1 Visit OT Treatments $Self Care/Home Management : 23-37 mins  Nestor Lewandowsky, OTR/L Acute Rehabilitation Services Pager: [phone] Office: [phone]   Malka So 04/18/2021, 5:11 PM

## 2021-04-18 NOTE — Consult Note (Signed)
   [facility] CM Inpatient Consult   04/18/2021  David [name] 03/22/1956 [id number]  [name] [ACO] Patient: [employer] Medicare  Primary Care Provider:  Kinnie Feil, MD, [facility] is an Embedded provider with a Chronic Care Management program and team  Patient screened for hospital length of stay 12 days with less than 30 days readmission hospitalization noted to assess for post hospital transition.    Plan:  Patient is awaiting a safe disposition and being recommended for a skilled nursing facility. If patient transitions his post hospital needs for transition is recommended for that level of care. Patient can be referred to the Embedded team for chronic care management needs for post facility care.  For questions contact:   Natividad Brood, RN BSN [facility] Liaison  [phone] business mobile phone Toll free office [phone]  Fax number: [phone] [email or web address]

## 2021-04-18 NOTE — Progress Notes (Signed)
Family Medicine Teaching Service Daily Progress Note Intern Pager: [phone]  Patient name: David [name] Medical record number: [id number] Date of birth: 1956-07-03 Age: 65 y.o. Gender: male  Primary Care Provider: Kinnie Feil, MD Consultants: Neuro (s/o), ID (s/o) Code Status: Full   Pt Overview and Major Events to Date:  8/7: Admitted to FPTS  Assessment and Plan:  David [name] is a 65yo male who presented with AMS and sepsis secondary to COVID-19, now with acute vs subacute frontal CVA. PMH significant for CVA in July 2022, HLD, Prediabetes, polysubstance use disorder. Medically Stable for discharge.   Difficult Disposition  Due to unstable housing, pt does not have a home to go to. Homeless shelters have been full. Difficulty finding a bed with SNF due to previous UDS.  -Updated UDS negative  -SW closely following, will contact to see what progress has been made.   Acute to Subacute CVA Currently mentating at baseline, at times seems withdrawn -ASA, Plavix, crestor -30 day cardiac monitoring  -Lexapro for depression (see below)  Acute hepatitis  -LFTs have downtrended to a normal level AST 27, ALT 33 with electrolytes wnl  -Updated labs reassuring   Depression He has been somewhat withdrawn in previous discussion with him. Today, he expresses sadness that he did not want to further discuss.  -Oriented  -Consider increasing dose of lexapro-Increase to 20mg    HTN, stable and normotensive -Amlodipine 5mg   BPH, stable -Tamsulosin 0.4mg  daily   Acute encephalopathy and COVID 19 have resolved   FEN/GI: Heart healthy PPx: Not needed Dispo:Pending SNF placement   Subjective:  Pt reports that he has no new complaints. When asked if he feels sad, he responded "yes." He did not want to discuss the depression further. Denies SI/HI.   Objective: Temp:  [97.7 F (36.5 C)-98.7 F (37.1 C)] 98 F (36.7 C) (08/19 1104) Pulse Rate:  [85-89] 85 (08/19 1104) Resp:   [15-21] 15 (08/19 1104) BP: (111-130)/(76-91) 114/76 (08/19 1104) SpO2:  [98 %-99 %] 98 % (08/19 0358) Weight:  [64.2 kg] 64.2 kg (08/19 0022) Physical Exam: General: NAD, resting in bed  Cardiovascular: RRR, no murmurs Respiratory: CTAB, Normal WOB Abdomen: Nontender, soft, BS present  Extremities: Moving extremities, weakness noted in left upper extremity today   Laboratory: No results for input(s): WBC, HGB, HCT, PLT in the last 168 hours. Recent Labs  Lab 04/12/21 0714 04/14/21 0349 04/17/21 0530  NA  --   --  133*  K  --   --  3.8  CL  --   --  102  CO2  --   --  22  BUN  --   --  15  CREATININE  --   --  0.91  CALCIUM  --   --  8.6*  PROT 7.6 7.8 8.0  BILITOT 0.7 0.7 0.6  ALKPHOS 80 90 88  ALT 79* 60* 33  AST 108* 56* 27  GLUCOSE  --   --  112*    New labs every Thursday   Imaging/Diagnostic Tests: No new   Erskine Emery, MD 04/18/2021, 11:36 AM PGY-1, [place] Intern pager: [phone], text pages welcome

## 2021-04-19 DIAGNOSIS — I6330 Cerebral infarction due to thrombosis of unspecified cerebral artery: Secondary | ICD-10-CM | POA: Diagnosis not present

## 2021-04-19 NOTE — Progress Notes (Addendum)
Family Medicine Teaching Service Daily Progress Note Intern Pager: [phone]  Patient name: David [name] Medical record number: [id number] Date of birth: [birth year] Age: 65 y.o. Gender: male  Primary Care Provider: Kinnie Feil, MD Consultants: Neuro, ID  Code Status: FULL   Pt Overview and Major Events to Date:  8/7: Admitted to FPTS  Assessment and Plan:  David [name] is a 65 year old male who presented with altered mental status, now resolved.  Now pending placement.  Past medical history significant for CVA, hyperlipidemia, hypertension, prediabetes and polysubstance disorder.  Difficult disposition Patient does not have a home to go to and is current homeless. Difficulty placing patient due to previous UDS. UDS on 8/19 negative.  Patient continues to be medically stable for discharge -CSW following for placement  -Continue physical therapy   Acute on subacute CVA -Continue aspirin 81 mg -Plavix 75 mg -Crestor 40 mg  Hypertension BP 120/84 -Continue Norvasc 5 mg  Depression Patient appears socially withdrawn throughout hospital admission.  He has expressed low mood. -Monitor mood -Lexapro increased to 20 mg  BPH -Continue Flomax 0.4 mg  Constipation  -Continue Miralax 17g BID   FEN/GI: Heart healthy PPx: Not needed Dispo:SNF  pending placement .   Subjective:  No acute concerns overnight.   Objective: Temp:  [97.6 F (36.4 C)-98.3 F (36.8 C)] 98.1 F (36.7 C) (08/20 0300) Pulse Rate:  [80-86] 80 (08/20 0300) Resp:  [15-22] 18 (08/20 0300) BP: (106-137)/(76-91) 137/81 (08/20 0300) SpO2:  [99 %-100 %] 100 % (08/20 0300)  Physical Exam: General: comfortable, no acute distress  Cardio: Normal S1 and S2, RRR, no r/m/g Pulm: CTAB, normal work of breathing Extremities: No peripheral edema.  Neuro: Cranial nerves grossly intact   Laboratory: No results for input(s): WBC, HGB, HCT, PLT in the last 168 hours. Recent Labs  Lab 04/12/21 0714  04/14/21 0349 04/17/21 0530  NA  --   --  133*  K  --   --  3.8  CL  --   --  102  CO2  --   --  22  BUN  --   --  15  CREATININE  --   --  0.91  CALCIUM  --   --  8.6*  PROT 7.6 7.8 8.0  BILITOT 0.7 0.7 0.6  ALKPHOS 80 90 88  ALT 79* 60* 33  AST 108* 56* 27  GLUCOSE  --   --  112*     Imaging/Diagnostic Tests: No results found.   Lattie Haw, MD 04/19/2021, 6:22 AM PGY-3, [place] Intern pager: [phone], text pages welcome

## 2021-04-19 NOTE — Plan of Care (Signed)
  Problem: Education: Goal: Knowledge of disease or condition will improve Outcome: Progressing Goal: Knowledge of secondary prevention will improve Outcome: Progressing Goal: Knowledge of patient specific risk factors addressed and post discharge goals established will improve Outcome: Progressing   Problem: Coping: Goal: Will verbalize positive feelings about self Outcome: Progressing   Problem: Health Behavior/Discharge Planning: Goal: Ability to manage health-related needs will improve Outcome: Progressing   Problem: Self-Care: Goal: Ability to participate in self-care as condition permits will improve Outcome: Progressing Goal: Verbalization of feelings and concerns over difficulty with self-care will improve Outcome: Progressing   Problem: Ischemic Stroke/TIA Tissue Perfusion: Goal: Complications of ischemic stroke/TIA will be minimized Outcome: Progressing   Problem: Education: Goal: Knowledge of General Education information will improve Description: Including pain rating scale, medication(s)/side effects and non-pharmacologic comfort measures Outcome: Progressing   Problem: Health Behavior/Discharge Planning: Goal: Ability to manage health-related needs will improve Outcome: Progressing   Problem: Clinical Measurements: Goal: Ability to maintain clinical measurements within normal limits will improve Outcome: Progressing Goal: Will remain free from infection Outcome: Progressing   Problem: Activity: Goal: Risk for activity intolerance will decrease Outcome: Progressing   Problem: Coping: Goal: Level of anxiety will decrease Outcome: Progressing

## 2021-04-20 DIAGNOSIS — I6330 Cerebral infarction due to thrombosis of unspecified cerebral artery: Secondary | ICD-10-CM | POA: Diagnosis not present

## 2021-04-20 NOTE — Progress Notes (Signed)
FPTS Brief Progress Note  S: Saw patient from doorway.  Sleeping comfortably.  Did not wake patient.   O: BP 135/78 (BP Location: Left Arm)   Pulse 79   Temp 98.9 F (37.2 C) (Oral)   Resp 16   Ht 5\' 10"  (1.778 m)   Wt 64.2 kg   SpO2 100%   BMI 20.31 kg/m   General: Sleeping comfortably, no acute distress  A/P: Plan per day team Medically stable for discharge.  Pending placement at this time. - Labs have been ordered every Thursday.  No need for labs tomorrow.   Gifford Shave, MD 04/20/2021, 12:04 AM PGY-3, [facility] Family Medicine Night Resident  Please page [phone] with questions.

## 2021-04-20 NOTE — Progress Notes (Addendum)
FPTS Brief Progress Note  S: Patient sleeping in bed upon entering room.  Did not wake patient.   O: BP 122/82 (BP Location: Right Arm)   Pulse 83   Temp 98.9 F (37.2 C) (Oral)   Resp 20   Ht 5\' 10"  (1.778 m)   Wt 64.2 kg   SpO2 100%   BMI 20.31 kg/m   General: Sleeping comfortably in bed   A/P: Awaiting SNF - Orders reviewed. Labs for AM not ordered, which was adjusted as needed. Labs every Thursday.  - Plan per day team.  Erskine Emery, MD 04/20/2021, 11:25 PM PGY-1, [place] [facility] Night Resident  Please page [phone] with questions.

## 2021-04-20 NOTE — Progress Notes (Signed)
Family Medicine Teaching Service Daily Progress Note Intern Pager: [phone]  Patient name: David [name] Medical record number: [id number] Date of birth: [birth year] Age: 65 y.o. Gender: male  Primary Care Provider: Kinnie Feil, MD Consultants: Neuro, infectious disease Code Status: Full code  Pt Overview and Major Events to Date:  8/7-admitted to F PTS  Assessment and Plan: David [name] is a 65 year old male presenting with altered mental status which has since resolved.  He is currently pending placement.  PMH is significant for CVA, hyperlipidemia, HTN, prediabetes, polysubstance disorder.  Difficult disposition Patient is currently homeless and has nowhere to go.  Due to positive UDS we are having difficulty placing the patient in assisted living/SNF.  UDS on 8/19 was negative for any substances.  Patient currently medically stable for discharge. - Continue to work with social work with placement - Continue physical therapy  Acute on subacute CVA - Continue ASA 81 mg - Continue Plavix 75 mg - Continue Crestor 40 mg  HTN BPs 101/83-1 35/78. - Continue Norvasc 5 mg daily  Depression Patient has been depressed throughout this hospital admission.  He has expressed that he has low mood. - Monitor mood - Lexapro has been increased to 20 mg daily  BPH - Continue Flomax 0.4 mg  Constipation - Continue MiraLAX 17 mg twice daily   FEN/GI: Heart healthy PPx: Not needed Dispo: Pending placement    Subjective:  Patient is sleeping when I enter the room.  Reports he is doing well and has no concerns or complaints at this time.  Objective: Temp:  [97.7 F (36.5 C)-98.9 F (37.2 C)] 97.7 F (36.5 C) (08/21 0451) Pulse Rate:  [76-83] 82 (08/21 0451) Resp:  [16-18] 17 (08/21 0451) BP: (101-135)/(78-88) 116/81 (08/21 0451) SpO2:  [99 %-100 %] 99 % (08/21 0451) Physical Exam: General: Sleeping comfortably, no acute distress Cardiovascular: Regular rate and rhythm, no  murmurs appreciated Respiratory: Normal work of breathing, room air, lungs clear to auscultation bilaterally Abdomen: Soft, nontender, positive bowel sounds Extremities: No gross abnormalities  Laboratory: No results for input(s): WBC, HGB, HCT, PLT in the last 168 hours. Recent Labs  Lab 04/14/21 0349 04/17/21 0530  NA  --  133*  K  --  3.8  CL  --  102  CO2  --  22  BUN  --  15  CREATININE  --  0.91  CALCIUM  --  8.6*  PROT 7.8 8.0  BILITOT 0.7 0.6  ALKPHOS 90 88  ALT 60* 33  AST 56* 27  GLUCOSE  --  112*     Imaging/Diagnostic Tests: No results found.   Gifford Shave, MD 04/20/2021, 6:21 AM PGY-3, [place] Intern pager: [phone], text pages welcome

## 2021-04-20 NOTE — Progress Notes (Signed)
Bath and oral care provided this AM and pt up in chair. Maintain awakefullness with lights on and blind opens. Patient is a/ox4 with delayed clear response w/ flat affect during assessment. Patient denied any pain or discomfort. Will continue to monitor.

## 2021-04-20 NOTE — Plan of Care (Signed)
  Problem: Education: Goal: Knowledge of disease or condition will improve Outcome: Progressing Goal: Knowledge of secondary prevention will improve Outcome: Progressing Goal: Knowledge of patient specific risk factors addressed and post discharge goals established will improve Outcome: Progressing   

## 2021-04-21 DIAGNOSIS — I6330 Cerebral infarction due to thrombosis of unspecified cerebral artery: Secondary | ICD-10-CM | POA: Diagnosis not present

## 2021-04-21 NOTE — Progress Notes (Addendum)
Family Medicine Teaching Service Daily Progress Note Intern Pager: [phone]  Patient name: David [name] Medical record number: [id number] Date of birth: 05/05/1956 Age: 65 y.o. Gender: male  Primary Care Provider: Kinnie Feil, MD Consultants: Neuro, ID Code Status: Full  Pt Overview and Major Events to Date:  8/7: admitted 8/19: negative UDS  Assessment and Plan: David [name] is a 65 y/o M presenting with AMS which is since resolved.  He is currently pending placement to SNF.PMHx significant for CVA, HLD, HTN, prediabetes, polysubstance disorder.  Difficult disposition Patient is currently homeless.  Due to positive UDS, there is difficulty placing patient in assisted living/SNF.  UDS on 8/19 was negative for any substances.  Patient is currently medically stable for discharge.  Advised patient to consider long-term goals for future success.  PT continues to advise SNF which I agree with as patient is at risk of continuous drug abuse should he be released for outpatient rehab. -Social work/TOC to continue search for placement -Continue PT  Acute on subacute CVA -Continue ASA 81 mg, Plavix 75 mg, Crestor 40 mg  HTN: Chronic, stable Blood pressure stable at this time ranging from 100s-130s/70s-80s -Continue amlodipine 5 mg daily  Depression Patient has been depressed throughout hospital admission.  Pt states his mood is fine today. Declined feeling down at this time.  -Continue to monitor mood, continue Lexapro 20 mg daily  BPH: Chronic, stable -Continue Flomax 0.4 mg daily  Constipation: Resolved -Change to MiraLAX once daily and senna once daily  FEN/GI: Heart healthy PPx: Encourage ambulation Dispo:SNF today. Barriers include placement.  Medically stable for discharge.   Subjective:  Patient states he is doing fine today.  Denies any low mood or concerns at this time.  Patient does not have long-term goals in mind.  Objective: Temp:  [97.9 F (36.6 C)-98.8 F  (37.1 C)] 98.4 F (36.9 C) (08/23 1205) Pulse Rate:  [81-88] 87 (08/23 1205) Resp:  [14-18] 14 (08/23 1205) BP: (104-134)/(77-84) 104/77 (08/23 1205) SpO2:  [91 %-100 %] 91 % (08/23 1205) Physical Exam: General: Awake, alert, no acute distress Cardiovascular: RRR, no murmurs appreciated Respiratory: CTA B, no increased work of breathing Abdomen: Soft, nondistended, normoactive bowel sounds  Laboratory: No results for input(s): WBC, HGB, HCT, PLT in the last 168 hours. Recent Labs  Lab 04/17/21 0530  NA 133*  K 3.8  CL 102  CO2 22  BUN 15  CREATININE 0.91  CALCIUM 8.6*  PROT 8.0  BILITOT 0.6  ALKPHOS 88  ALT 33  AST 27  GLUCOSE 112*   Imaging/Diagnostic Tests: No new imaging/diagnostic tests at this time.  Wells Guiles, DO 04/22/2021, 1:37 PM PGY-1, [place] Intern pager: [phone], text pages welcome

## 2021-04-21 NOTE — Progress Notes (Signed)
FPTS Brief Progress Note  S Saw patient at bedside this evening. Patient was watching tv. No concerns at this time.   O: BP 134/84 (BP Location: Left Arm)   Pulse 81   Temp 98.3 F (36.8 C) (Oral)   Resp 18   Ht 5\' 10"  (1.778 m)   Wt 64.2 kg   SpO2 100%   BMI 20.31 kg/m    General: Alert, no acute distress, comfortable  A/P: Plan per day team  -Pending placement  - Orders reviewed. Labs for AM not ordered, which was adjusted as needed.   Lattie Haw, MD 04/21/2021, 9:35 PM PGY-3, [facility] Family Medicine Night Resident  Please page [phone] with questions.

## 2021-04-21 NOTE — Progress Notes (Signed)
Physical Therapy Treatment Patient Details Name: David [name] MRN: [id number] DOB: Oct 09, 1955 Today's Date: 04/21/2021    History of Present Illness Pt is a 65 y.o. male who presented 04/05/21 with AMS and pt falling. MRI revealed acute to subacute small vessel infarcts in the left corona  radiata and right frontal white matter. UDS positive for cocaine. Pt with AKI and sepsis 2/2 COVID infection. Patient was recently admitted from 03/13/21-03/15/21 for new CVA. PMH: CVA, falls, hyperlipidemia, hypertension, prediabetes, BPH, substance use disorder.    PT Comments    Pt making improvements in safety and attention to R side. Begins ambulation with min-guard A and good R foot clearance. By end of ambulation needs min A with decreased clearance. Encouraged him to try [facility] [employer] [facility] after ambulating with RW since he is making progress but he deferred. Pt remains very flat and says that he is depressed. PT will continue to follow.    Follow Up Recommendations  SNF;Supervision/Assistance - 24 hour     Equipment Recommendations  Rolling walker with 5" wheels    Recommendations for Other Services       Precautions / Restrictions Precautions Precautions: Fall Precaution Comments: off airborne Restrictions Weight Bearing Restrictions: No    Mobility  Bed Mobility Overal bed mobility: Needs Assistance Bed Mobility: Supine to Sit;Sit to Supine     Supine to sit: Supervision Sit to supine: Supervision   General bed mobility comments: vc's for positioning in bed but pt did not need physical assist    Transfers Overall transfer level: Needs assistance Equipment used: Rolling walker (2 wheeled) Transfers: Sit to/from Stand Sit to Stand: Min assist         General transfer comment: min A to fwd wt shift and power up from bed in lowest position, has difficult with the sequencing of the task  Ambulation/Gait Ambulation/Gait assistance: Min guard;Min assist Gait Distance (Feet): 200  Feet Assistive device: Rolling walker (2 wheeled) Gait Pattern/deviations: Decreased stride length;Decreased dorsiflexion - right;Decreased weight shift to right;Decreased stance time - right;Narrow base of support;Trunk flexed;Step-through pattern Gait velocity: decreased Gait velocity interpretation: <1.8 ft/sec, indicate of risk for recurrent falls General Gait Details: began with min-guard A and good R foot clearance. Was not as good end of ambulation and needed min A. Pt navigated around all R obstacles which is an improvement for him. Began to discuss training with a less supportive device but once he had walked with RW he was not willing to try.   Stairs             Wheelchair Mobility    Modified Rankin (Stroke Patients Only) Modified Rankin (Stroke Patients Only) Pre-Morbid Rankin Score: Slight disability Modified Rankin: Moderately severe disability     Balance Overall balance assessment: Needs assistance Sitting-balance support: Feet supported Sitting balance-Leahy Scale: Fair     Standing balance support: No upper extremity supported Standing balance-Leahy Scale: Poor Standing balance comment: able to maintain static stance for short periods with close supervision                            Cognition Arousal/Alertness: Awake/alert Behavior During Therapy: Flat affect Overall Cognitive Status: Impaired/Different from baseline Area of Impairment: Attention;Following commands;Safety/judgement;Awareness;Problem solving;Orientation                   Current Attention Level: Selective Memory: Decreased short-term memory Following Commands: Follows one step commands consistently;Follows multi-step commands inconsistently Safety/Judgement: Decreased awareness of safety;Decreased  awareness of deficits Awareness: Emergent Problem Solving: Slow processing;Decreased initiation;Requires verbal cues General Comments: pt relays that he is depressed and  was before he came in      Exercises General Exercises - Lower Extremity Ankle Circles/Pumps: AROM;Right;10 reps;Seated Straight Leg Raises: AROM;Right;10 reps;Supine (with control)    General Comments General comments (skin integrity, edema, etc.): Pt SOB after ambulation, HR 108 bpm, SpO2 98% on RA      Pertinent Vitals/Pain Pain Assessment: No/denies pain    Home Living                      Prior Function            PT Goals (current goals can now be found in the care plan section) Acute Rehab PT Goals Patient Stated Goal: not stated PT Goal Formulation: With patient Time For Goal Achievement: 05/04/21 Potential to Achieve Goals: Good Progress towards PT goals: Progressing toward goals    Frequency    Min 3X/week      PT Plan Current plan remains appropriate    Co-evaluation              AM-PAC PT "6 Clicks" Mobility   Outcome Measure  Help needed turning from your back to your side while in a flat bed without using bedrails?: A Little Help needed moving from lying on your back to sitting on the side of a flat bed without using bedrails?: A Little Help needed moving to and from a bed to a chair (including a wheelchair)?: A Little Help needed standing up from a chair using your arms (e.g., wheelchair or bedside chair)?: A Little Help needed to walk in hospital room?: A Little Help needed climbing 3-5 steps with a railing? : A Little 6 Click Score: 18    End of Session Equipment Utilized During Treatment: Gait belt Activity Tolerance: Patient tolerated treatment well Patient left: in bed;with call bell/phone within reach;with bed alarm set Nurse Communication: Mobility status PT Visit Diagnosis: Unsteadiness on feet (R26.81);Repeated falls (R29.6);History of falling (Z91.81);Muscle weakness (generalized) (M62.81);Hemiplegia and hemiparesis;Difficulty in walking, not elsewhere classified (R26.2);Other abnormalities of gait and mobility  (R26.89);Other symptoms and signs involving the nervous system (R29.898);Apraxia (R48.2) Hemiplegia - Right/Left: Right Hemiplegia - dominant/non-dominant: Dominant Hemiplegia - caused by: Cerebral infarction     Time: 8338-2505 PT Time Calculation (min) (ACUTE ONLY): 10 min  Charges:  $Gait Training: 8-22 mins                     Leighton Roach, PT  Acute Rehab Services  Pager [phone] Office [place] 04/21/2021, 1:53 PM

## 2021-04-21 NOTE — Plan of Care (Signed)
  Problem: Education: Goal: Knowledge of disease or condition will improve Outcome: Progressing Goal: Knowledge of secondary prevention will improve Outcome: Progressing Goal: Knowledge of patient specific risk factors addressed and post discharge goals established will improve Outcome: Progressing   Problem: Ischemic Stroke/TIA Tissue Perfusion: Goal: Complications of ischemic stroke/TIA will be minimized Outcome: Progressing   Problem: Safety: Goal: Ability to remain free from injury will improve Outcome: Progressing

## 2021-04-21 NOTE — Progress Notes (Signed)
Family Medicine Teaching Service Daily Progress Note Intern Pager: [phone]  Patient name: TASMAN ZAPATA Medical record number: [id number] Date of birth: Jun 23, 1956 Age: 65 y.o. Gender: male  Primary Care Provider: Kinnie Feil, MD Consultants: Neuro, ID Code Status: Full  Pt Overview and Major Events to Date:  8/7: admitted  Assessment and Plan: Boleslaw Borghi is a 65 y/o M presenting with altered mental status which is since resolved.  He is currently pending placement to SNF.  Past medical history significant for CVA, HLD, HTN, prediabetes, polysubstance disorder.  Difficult disposition Patient is currently homeless.  Due to positive UDS, we are having difficulty placing the patient in assisted living/SNF.  UDS on 8/19 was negative for any substances.  Patient is currently medically stable for discharge. -Social work to continue search for placement -Continue PT  Acute on subacute CVA -continue ASA 81 mg, Plavix 75 mg, Crestor 40 mg  HTN BPs stable at this time ranging from 110-120s/70-80s. -Continue amlodipine 5 mg daily  Depression Patient been depressed throughout this hospital admission.  He has expressed that he has low mood. Mood seems to have improved today. -Continue to monitor mood, continue Lexapro 20 mg daily  BPH -Continue Flomax 0.4 mg  Constipation -Continue MiraLAX twice daily and senna once daily  FEN/GI: Heart healthy PPx: Not needed Dispo:SNF today. Barriers include pending placement.  Medically stable for discharge.  Subjective:  Patient not have any new concerns today.  He was active in conversation stated he did not have any questions to address.  He is awaiting placement somewhere else.  Objective: Temp:  [97.7 F (36.5 C)-98.9 F (37.2 C)] 98.9 F (37.2 C) (08/22 1214) Pulse Rate:  [83-95] 95 (08/22 1214) Resp:  [18-20] 18 (08/22 1214) BP: (110-128)/(72-85) 110/74 (08/22 1214) SpO2:  [99 %-100 %] 99 % (08/22 1214) Physical  Exam: General: Awake, alert, laying in bed comfortably, no acute distress Cardiovascular: RRR, no murmurs appreciated Respiratory: CTA B, no increased work of breathing Abdomen: Soft, nondistended Extremities: 2+ radial pulses  Laboratory: No results for input(s): WBC, HGB, HCT, PLT in the last 168 hours. Recent Labs  Lab 04/17/21 0530  NA 133*  K 3.8  CL 102  CO2 22  BUN 15  CREATININE 0.91  CALCIUM 8.6*  PROT 8.0  BILITOT 0.6  ALKPHOS 88  ALT 33  AST 27  GLUCOSE 112*   Imaging/Diagnostic Tests: No new imaging/diagnostic tests at this time.  Wells Guiles, DO 04/21/2021, 2:05 PM PGY-1, [place] Intern pager: [phone], text pages welcome

## 2021-04-22 DIAGNOSIS — I6330 Cerebral infarction due to thrombosis of unspecified cerebral artery: Secondary | ICD-10-CM | POA: Diagnosis not present

## 2021-04-22 NOTE — TOC Progression Note (Addendum)
Transition of Care [facility] - Progression Note    Patient Details  Name: David [name] MRN: [id number] Date of Birth: [birth year]  Transition of Care [facility]) CM/SW [place], RN Phone Number: 04/22/2021, 10:10 AM  Clinical Narrative:    Case management met with the patient at the bedside regarding transitions of care.  The patient states that he receives an income check on 05/03/2021 and he is agreeable to having his family assist him with local hotel accommodations.  The patient was homeless prior to admission to the hospital and currently has no bed offers to a SNF facility.  The patient is progressing well with PT at this time.  I spoke with the patient and he was agreeable for me to reach out to his family to set him  up with hotel accommodations and possible home health and/or referral to outpatient rehab for therapy if unable to find accepting home health agency.  CM and MSW will continue to follow the patient for transitions of care.  04/22/2021 1207 - [facility] accepted the patient for admission on Friday, 04/25/2021.  I called and spoke with Monia Pouch, CM at the facility and they are willing to offer the patient a bed once insurance authorization is approved.  I called and spoke with the patient's son, Linton Rump, on the phone and he is aware that the patient was offered a SNF bed with pending insurance authorization.  I explained to the patient's son that if the patient is declined for authorization than the family would need to pick the patient up for discharge and set the patient up with a hotel room of choice and assist with groceries and care.  The patient's son was agreeable and understood that insurance authorization was pending for the patient's admission at this time.  The medical team and therapy was updated on the transitions of care plan.   Expected Discharge Plan: [place] Barriers to Discharge: Homeless with medical needs, No  SNF bed  Expected Discharge Plan and Services Expected Discharge Plan: [place] In-house Referral: Clinical Social Work Discharge Planning Services: CM Consult Post Acute Care Choice: [place] arrangements for the past 2 months: Homeless Shelter                                       Social Determinants of Health (SDOH) Interventions    Readmission Risk Interventions No flowsheet data found.

## 2021-04-22 NOTE — Progress Notes (Signed)
Family Medicine Teaching Service Daily Progress Note Intern Pager: [phone]  Patient name: David [name] Medical record number: [id number] Date of birth: [birth year] Age: 65 y.o. Gender: male  Primary Care Provider: Kinnie Feil, MD Consultants: Neuro, ID Code Status: Full  Pt Overview and Major Events to Date:  8/7: admitted 8/19: negative UDS  Assessment and Plan: David [name] is a 66 y/o M presenting with AMS which is since resolved. He is currently pending placement to SNF. PMHx significant for CVA, HLD, HTN, prediabetes, polysubstance disorder.   Difficult Disposition Patient is currently homeless. Due to positive UDS, there is difficulty placing patient in assisted living/SNF. UDS on 8/19 was negative. Patient is currently medically stable for discharge. TOC found SNF placement and awaiting insurance authorization.  Patient has not thought further on long-term goals for success but is interested in resources. -Social work/TOC to continue pursuit of SNF placement -Continue PT/OT  Acute on subacute CVA -Continue ASA 81 mg, Plavix 75 mg, Crestor 40 mg  HTN: Chronic, stable Blood pressure stable at this time ranging from 100s-120s/70s-80s. -Continue amlodipine 5 mg daily  Depression Patient has been depressed throughout hospital admission.  Patient states his mood is fine today. -Continue to monitor mood, continue Lexapro 20 mg daily  BPH: Chronic, stable -Continue Flomax 0.4 mg daily  CHANGE BOWEL REGIMEN TO ONCE DAILY EACH  FEN/GI: Heart healthy PPx: Encourage ambulation Dispo:SNF today. Barriers include insurance authorization.  Medically stable for discharge.   Subjective:  Patient not have any concerns or questions to address at this time.  Still awaiting SNF placement.  Has not thought further on long-term goals.  Objective: Temp:  [97.8 F (36.6 C)-98.5 F (36.9 C)] 98.2 F (36.8 C) (08/24 1521) Pulse Rate:  [76-85] 76 (08/24 1521) Resp:  [14-18] 18 (08/24  1521) BP: (101-123)/(71-80) 101/75 (08/24 1521) SpO2:  [99 %-100 %] 100 % (08/24 1521) Physical Exam: General: Awake, alert, no acute distress Cardiovascular: RRR, no murmurs auscultated Respiratory: CTA B, no increased work of breathing Abdomen: Soft, nontender, normoactive bowel sounds  Laboratory: No results for input(s): WBC, HGB, HCT, PLT in the last 168 hours. Recent Labs  Lab 04/17/21 0530  NA 133*  K 3.8  CL 102  CO2 22  BUN 15  CREATININE 0.91  CALCIUM 8.6*  PROT 8.0  BILITOT 0.6  ALKPHOS 88  ALT 33  AST 27  GLUCOSE 112*    Imaging/Diagnostic Tests: No new imaging/diagnostic tests at this time.  David Guiles, DO 04/23/2021, 3:51 PM PGY-1, [place] Intern pager: [phone], text pages welcome

## 2021-04-22 NOTE — Progress Notes (Signed)
Occupational Therapy Treatment Patient Details Name: David [name] MRN: [id number] DOB: 12-03-1955 Today's Date: 04/22/2021    History of present illness Pt is a 65 y.o. male who presented 04/05/21 with AMS and pt falling. MRI revealed acute to subacute small vessel infarcts in the left corona  radiata and right frontal white matter. UDS positive for cocaine. Pt with AKI and sepsis 2/2 COVID infection. Patient was recently admitted from 03/13/21-03/15/21 for new CVA. PMH: CVA, falls, hyperlipidemia, hypertension, prediabetes, BPH, substance use disorder.   OT comments  Pt continues to demonstrate decreased cognition, balance, and activity tolerance. Challenging cognition and activity tolerance with trail making task in hallway. Pt requiring Mod cues to recall three different locations. Pt also demonstrating decreased activity tolerance with fatigue and increased dragging of R foot at end of session. Pt requiring Min Guard A and RW for functional mobility. Continue to recommend dc once medically stable and will continue to follow acutely as admitted.    Follow Up Recommendations  Supervision/Assistance - 24 hour    Equipment Recommendations  3 in 1 bedside commode    Recommendations for Other Services      Precautions / Restrictions Precautions Precautions: Fall Precaution Comments: off airborne       Mobility Bed Mobility Overal bed mobility: Needs Assistance Bed Mobility: Supine to Sit;Sit to Supine     Supine to sit: Supervision Sit to supine: Supervision        Transfers Overall transfer level: Needs assistance Equipment used: Rolling walker (2 wheeled) Transfers: Sit to/from Stand Sit to Stand: Min guard         General transfer comment: Min Guard A for safety    Balance Overall balance assessment: Needs assistance Sitting-balance support: No upper extremity supported;Feet supported Sitting balance-Leahy Scale: Good     Standing balance support: Bilateral upper  extremity supported;During functional activity Standing balance-Leahy Scale: Poor                             ADL either performed or assessed with clinical judgement   ADL Overall ADL's : Needs assistance/impaired                   Upper Body Dressing Details (indicate cue type and reason): donned/doffed gown like jacket   Lower Body Dressing Details (indicate cue type and reason): Adjusting socks while sitting at EOB             Functional mobility during ADLs: Min guard;Rolling walker General ADL Comments: Focused session on activity tolerance and cognition to perform three part trail making task.     Vision       Perception     Praxis      Cognition Arousal/Alertness: Awake/alert Behavior During Therapy: Flat affect Overall Cognitive Status: Impaired/Different from baseline Area of Impairment: Attention;Following commands;Safety/judgement;Awareness;Problem solving;Memory                   Current Attention Level: Selective Memory: Decreased short-term memory Following Commands: Follows one step commands with increased time Safety/Judgement: Decreased awareness of safety;Decreased awareness of deficits Awareness: Emergent Problem Solving: Slow processing;Decreased initiation;Requires verbal cues General Comments: Pt performing three part trail making task. Pt demosntrating poor ST memory to recall the three rooms he was to locate. Pt combining numbers such as saying "room 38" when he was asked to locate 36 and 28. Did not recall last room. Demonstrating problem solving to navigate halls when looking for  23.        Exercises     Shoulder Instructions       General Comments      Pertinent Vitals/ Pain       Pain Assessment: No/denies pain  Home Living                                          Prior Functioning/Environment              Frequency  Min 2X/week        Progress Toward Goals  OT  Goals(current goals can now be found in the care plan section)  Progress towards OT goals: Progressing toward goals  Acute Rehab OT Goals Patient Stated Goal: not stated OT Goal Formulation: With patient Time For Goal Achievement: 04/20/21 Potential to Achieve Goals: Fair ADL Goals Pt Will Perform Grooming: with supervision;standing Pt Will Perform Upper Body Bathing: with supervision;sitting;standing Pt Will Perform Lower Body Bathing: with supervision;sit to/from stand Pt Will Perform Upper Body Dressing: with supervision;sitting;standing Pt Will Perform Lower Body Dressing: with supervision;sit to/from stand Pt Will Transfer to Toilet: with supervision;ambulating;bedside commode Pt Will Perform Toileting - Clothing Manipulation and hygiene: with supervision;sit to/from stand Additional ADL Goal #1: Pt will be S in and OOB for basic ADLs Additional ADL Goal #2: Continue to assess vision  Plan Discharge plan remains appropriate    Co-evaluation                 AM-PAC OT "6 Clicks" Daily Activity     Outcome Measure   Help from another person eating meals?: None Help from another person taking care of personal grooming?: A Little Help from another person toileting, which includes using toliet, bedpan, or urinal?: A Little Help from another person bathing (including washing, rinsing, drying)?: A Lot Help from another person to put on and taking off regular upper body clothing?: A Little Help from another person to put on and taking off regular lower body clothing?: A Little 6 Click Score: 18    End of Session Equipment Utilized During Treatment: Gait belt;Rolling walker  OT Visit Diagnosis: Unsteadiness on feet (R26.81);Repeated falls (R29.6);Muscle weakness (generalized) (M62.81);Other symptoms and signs involving cognitive function;Hemiplegia and hemiparesis Hemiplegia - Right/Left: Right Hemiplegia - dominant/non-dominant: Non-Dominant Hemiplegia - caused by: Cerebral  infarction   Activity Tolerance Patient tolerated treatment well   Patient Left with call bell/phone within reach;in bed;with bed alarm set   Nurse Communication Mobility status        Time: 3007-6226 OT Time Calculation (min): 17 min  Charges: OT General Charges $OT Visit: 1 Visit OT Treatments $Therapeutic Activity: 8-22 mins  [place], OTR/L Acute Rehab Pager: [phone] Office: [place] 04/22/2021, 4:42 PM

## 2021-04-22 NOTE — Plan of Care (Signed)
  Problem: Education: Goal: Knowledge of disease or condition will improve Outcome: Progressing Goal: Knowledge of secondary prevention will improve Outcome: Progressing Goal: Knowledge of patient specific risk factors addressed and post discharge goals established will improve Outcome: Progressing   Problem: Coping: Goal: Will verbalize positive feelings about self Outcome: Progressing Goal: Will identify appropriate support needs Outcome: Progressing   Problem: Ischemic Stroke/TIA Tissue Perfusion: Goal: Complications of ischemic stroke/TIA will be minimized Outcome: Progressing   

## 2021-04-22 NOTE — Progress Notes (Signed)
CSW submitted clinical information to [facility] for review to obtain insurance authorization.  Madilyn Fireman, MSW, LCSW Transitions of Care  Clinical Social Worker II [phone]

## 2021-04-23 DIAGNOSIS — I6330 Cerebral infarction due to thrombosis of unspecified cerebral artery: Secondary | ICD-10-CM | POA: Diagnosis not present

## 2021-04-23 LAB — SARS CORONAVIRUS 2 (TAT 6-24 HRS): SARS Coronavirus 2: NEGATIVE

## 2021-04-23 MED ORDER — POLYETHYLENE GLYCOL 3350 17 G PO PACK
17.0000 g | PACK | Freq: Every day | ORAL | Status: DC
  Administered 2021-04-24: 17 g via ORAL
  Filled 2021-04-23: qty 1

## 2021-04-23 NOTE — Progress Notes (Signed)
Physical Therapy Treatment Patient Details Name: David [name] MRN: [id number] DOB: 12/13/1955 Today's Date: 04/23/2021    History of Present Illness Pt is a 65 y.o. male who presented 04/05/21 with AMS and pt falling. MRI revealed acute to subacute small vessel infarcts in the left corona  radiata and right frontal white matter. UDS positive for cocaine. Pt with AKI and sepsis 2/2 COVID infection. Patient was recently admitted from 03/13/21-03/15/21 for new CVA. PMH: CVA, falls, hyperlipidemia, hypertension, prediabetes, BPH, substance use disorder.    PT Comments    Pt progressing well towards all goals however pt remains to have impaired cognition and impaired balance inhibiting safe return home alone. Pt began sequencing with a straight cane for ambulation however is unsafe to do indep. Continue to recommend SNF upon d/c to achieve safe mod I level of function. Acute PT to cont to follow.    Follow Up Recommendations  SNF;Supervision/Assistance - 24 hour     Equipment Recommendations  Rolling walker with 5" wheels    Recommendations for Other Services       Precautions / Restrictions Precautions Precautions: Fall Restrictions Weight Bearing Restrictions: No    Mobility  Bed Mobility Overal bed mobility: Needs Assistance Bed Mobility: Supine to Sit     Supine to sit: Supervision Sit to supine: Supervision   General bed mobility comments: HOB elevated, no physical assist needed    Transfers Overall transfer level: Needs assistance Equipment used: Rolling walker (2 wheeled) Transfers: Sit to/from Stand Sit to Stand: Min guard         General transfer comment: Min Guard A for safety  Ambulation/Gait Ambulation/Gait assistance: Min guard;Min Web designer (Feet): 150 Feet Assistive device: Rolling walker (2 wheeled);Straight cane Gait Pattern/deviations: Decreased stride length;Decreased dorsiflexion - right;Decreased weight shift to right;Decreased stance  time - right;Step-through pattern Gait velocity: decreased Gait velocity interpretation: 1.31 - 2.62 ft/sec, indicative of limited community ambulator General Gait Details: initially amb with RW, verbal cues to stay in walker and concentrate on clearing R LE., pt then trialed straight cane in L hand to sequence with R LE. Pt amb 100' with straight cane prior to beginning to scuff and not clear R foot consistently, pt given back RW for increased support in which pt was able to clear foot the last 22' with RW   Stairs             Wheelchair Mobility    Modified Rankin (Stroke Patients Only) Modified Rankin (Stroke Patients Only) Pre-Morbid Rankin Score: Slight disability Modified Rankin: Moderately severe disability     Balance Overall balance assessment: Needs assistance Sitting-balance support: No upper extremity supported;Feet supported Sitting balance-Leahy Scale: Good     Standing balance support: During functional activity;Single extremity supported Standing balance-Leahy Scale: Fair Standing balance comment: able to maintain static stance for short periods with close supervision                            Cognition Arousal/Alertness: Awake/alert Behavior During Therapy: Flat affect Overall Cognitive Status: Impaired/Different from baseline Area of Impairment: Attention;Following commands;Safety/judgement;Awareness;Problem solving;Memory                   Current Attention Level: Selective Memory: Decreased short-term memory Following Commands: Follows one step commands with increased time Safety/Judgement: Decreased awareness of safety;Decreased awareness of deficits Awareness: Emergent Problem Solving: Slow processing;Decreased initiation;Requires verbal cues General Comments: pt had a hard time with L/R directions  but was able to sequence cane properly with minimal cueing      Exercises General Exercises - Lower Extremity Long Arc Quad:  AROM;Right;10 reps;Seated (with resistance) Heel Slides: AROM;Right;10 reps;Seated (with resistance) Hip ABduction/ADduction: AROM;Right;10 reps;Seated (with resistance) Straight Leg Raises:  (with control) Hip Flexion/Marching: AROM;Right;10 reps;Seated (with resistance) Heel Raises: AROM;Right;10 reps;Seated (with resistance)    General Comments General comments (skin integrity, edema, etc.): VSS      Pertinent Vitals/Pain Pain Assessment: No/denies pain    Home Living                      Prior Function            PT Goals (current goals can now be found in the care plan section) Progress towards PT goals: Progressing toward goals    Frequency    Min 3X/week      PT Plan Current plan remains appropriate    Co-evaluation              AM-PAC PT "6 Clicks" Mobility   Outcome Measure  Help needed turning from your back to your side while in a flat bed without using bedrails?: A Little Help needed moving from lying on your back to sitting on the side of a flat bed without using bedrails?: A Little Help needed moving to and from a bed to a chair (including a wheelchair)?: A Little Help needed standing up from a chair using your arms (e.g., wheelchair or bedside chair)?: A Little Help needed to walk in hospital room?: A Little Help needed climbing 3-5 steps with a railing? : A Little 6 Click Score: 18    End of Session Equipment Utilized During Treatment: Gait belt Activity Tolerance: Patient tolerated treatment well Patient left: in chair;with call bell/phone within reach;with chair alarm set Nurse Communication: Mobility status PT Visit Diagnosis: Unsteadiness on feet (R26.81);Repeated falls (R29.6);History of falling (Z91.81);Muscle weakness (generalized) (M62.81);Hemiplegia and hemiparesis;Difficulty in walking, not elsewhere classified (R26.2);Other abnormalities of gait and mobility (R26.89);Other symptoms and signs involving the nervous system  (R29.898);Apraxia (R48.2) Hemiplegia - Right/Left: Right Hemiplegia - dominant/non-dominant: Dominant Hemiplegia - caused by: Cerebral infarction     Time: 0768-0881 PT Time Calculation (min) (ACUTE ONLY): 23 min  Charges:  $Gait Training: 8-22 mins $Therapeutic Exercise: 8-22 mins                     Kittie Plater, PT, DPT Acute Rehabilitation Services Pager #: [phone] Office #: [phone]    [place] Lopes 04/23/2021, 2:05 PM

## 2021-04-23 NOTE — Progress Notes (Addendum)
FPTS Brief Progress Note  S Saw patient at bedside this evening. Patient was sleeping comfortably. I did not wake the patient.  O: BP 123/78 (BP Location: Left Arm)   Pulse 81   Temp 98.1 F (36.7 C)   Resp 18   Ht 5\' 10"  (1.778 m)   Wt 64.2 kg   SpO2 100%   BMI 20.31 kg/m    General: sleeping comfortably   A/P: Plan per day team  - Orders reviewed. Labs for AM not ordered, which was adjusted as needed.   Lattie Haw, MD 04/23/2021, 2:21 AM PGY-3, [facility] Family Medicine Night Resident  Please page [phone] with questions.

## 2021-04-23 NOTE — Progress Notes (Signed)
Family Medicine Teaching Service Daily Progress Note Intern Pager: [phone]  Patient name: David [name] Medical record number: [id number] Date of birth: December 11, 1955 Age: 65 y.o. Gender: male  Primary Care Provider: Kinnie Feil, MD Consultants: Neuro, ID Code Status: Full  Pt Overview and Major Events to Date:  8/7: admitted 8/19: negative UDS  Assessment and Plan: David [name] is a 65 y/o M presenting with AMS which is since resolved.  He is currently pending insurance authorization for SNF placement.  Past medical history significant for CVA, HLD, HTN, prediabetes, polysubstance disorder.  Difficult disposition Patient is currently homeless.  Has been difficulty placing him in assisted living/SNF due to positive UDS.  UDS on 8/19 was negative.  Patient is currently medically stable for discharge.  He is awaiting insurance authorization for SNF placement.  Patient believes he has been making progress on ambulation -To be transferred to [place] PT/OT  Depression Patient has been depressed throughout hospital admission but has had improved mood since increasing Lexapro.  Patient believes his mood is improved -Continue to monitor mood, continue Lexapro 20 mg daily  Unchanged management Acute on subacute CVA, BPH, HTN  FEN/GI: Heart healthy PPx: Encourage ambulation Dispo:SNF tomorrow. Barriers include none.  Medically stable for discharge.   Subjective:  Patient not have any concerns or questions at this time.  He denies having any thoughts about long-term success.  Believes his ambulation is improving with PT/OT and mood improved on increased Lexapro dosage.  Objective: Temp:  [98 F (36.7 C)-98.5 F (36.9 C)] 98 F (36.7 C) (08/25 0725) Pulse Rate:  [68-79] 74 (08/25 0725) Resp:  [14-18] 18 (08/25 0725) BP: (101-117)/(71-78) 107/77 (08/25 0725) SpO2:  [98 %-100 %] 99 % (08/25 0725) Physical Exam: General: Awake, alert, no acute  distress, calm Cardiovascular: RRR, no murmurs auscultated Respiratory: CTA B, no increased work of breathing Abdomen: Soft, nontender, normoactive bowel sounds  Laboratory: No results for input(s): WBC, HGB, HCT, PLT in the last 168 hours. Recent Labs  Lab 04/24/21 0406  NA 137  K 4.0  CL 103  CO2 27  BUN 20  CREATININE 1.06  CALCIUM 8.7*  PROT 7.9  BILITOT 0.3  ALKPHOS 83  ALT 25  AST 28  GLUCOSE 96    Imaging/Diagnostic Tests: No new imaging/diagnostic tests at this time.  David Guiles, DO 04/24/2021, 9:32 AM PGY-1, [place] Intern pager: [phone], text pages welcome

## 2021-04-23 NOTE — Progress Notes (Signed)
FPTS Brief Progress Note  S Saw patient at bedside this evening. Patient was resting comfortably. No acute concerns.   O: BP 117/78 (BP Location: Right Arm)   Pulse 68   Temp 98.1 F (36.7 C) (Oral)   Resp 18   Ht 5\' 10"  (1.778 m)   Wt 64.2 kg   SpO2 100%   BMI 20.31 kg/m    General: Alert, no acute distress  A/P: Plan per day team  - Orders reviewed. Labs for AM not ordered, which was adjusted as needed.   Lattie Haw, MD 04/23/2021, 10:41 PM PGY-3,  Family Medicine Night Resident  Please page [phone] with questions.

## 2021-04-24 DIAGNOSIS — D709 Neutropenia, unspecified: Secondary | ICD-10-CM

## 2021-04-24 DIAGNOSIS — I6330 Cerebral infarction due to thrombosis of unspecified cerebral artery: Secondary | ICD-10-CM | POA: Diagnosis not present

## 2021-04-24 LAB — COMPREHENSIVE METABOLIC PANEL
ALT: 25 U/L (ref 0–44)
AST: 28 U/L (ref 15–41)
Albumin: 2.9 g/dL — ABNORMAL LOW (ref 3.5–5.0)
Alkaline Phosphatase: 83 U/L (ref 38–126)
Anion gap: 7 (ref 5–15)
BUN: 20 mg/dL (ref 8–23)
CO2: 27 mmol/L (ref 22–32)
Calcium: 8.7 mg/dL — ABNORMAL LOW (ref 8.9–10.3)
Chloride: 103 mmol/L (ref 98–111)
Creatinine, Ser: 1.06 mg/dL (ref 0.61–1.24)
GFR, Estimated: >60 mL/min (ref >60)
Glucose, Bld: 96 mg/dL (ref 70–99)
Potassium: 4 mmol/L (ref 3.5–5.1)
Sodium: 137 mmol/L (ref 135–145)
Total Bilirubin: 0.3 mg/dL (ref 0.3–1.2)
Total Protein: 7.9 g/dL (ref 6.5–8.1)

## 2021-04-24 NOTE — TOC Progression Note (Signed)
Transition of Care [facility]) - Progression Note    Patient Details  Name: ISAIHA ASARE MRN: [id number] Date of Birth: 06/06/1956  Transition of Care [facility]) CM/SW [place], RN Phone Number: 04/24/2021, 1:38 PM  Clinical Narrative:    CM spoke with Madilyn Fireman, MSW and the patient was accepted and insurance authorization was approved for admission to [place] facility.  I called and spoke with the patient's son, Linton Rump, on the phone and he is aware that the patient will be transferred tomorrow by PTAR to the facility.  CM and MSW will continue to follow the patient for admission to [facility] on 04/25/2021.   Expected Discharge Plan: Home Barriers to Discharge: No Barriers Identified  Expected Discharge Plan and Services Expected Discharge Plan: [place] In-house Referral: Clinical Social Work Discharge Planning Services: CM Consult Post Acute Care Choice: [place] arrangements for the past 2 months: Homeless Shelter                                       Social Determinants of Health (SDOH) Interventions    Readmission Risk Interventions No flowsheet data found.

## 2021-04-24 NOTE — Progress Notes (Signed)
FPTS Brief Progress Note  S Saw patient at bedside this evening. Patient was in bed watching tv. No concerns at this time.  O: BP 104/78 (BP Location: Right Arm)   Pulse 84   Temp 97.9 F (36.6 C) (Oral)   Resp 18   Ht 5\' 10"  (1.778 m)   Wt 64.2 kg   SpO2 100%   BMI 20.31 kg/m    General: Alert, no acute distress  A/P: Plan per day team  -Discharge to SNF am  - Orders reviewed. Labs for AM not ordered, which was adjusted as needed.   Lattie Haw, MD 04/24/2021, 8:11 PM PGY-3, [facility] Family Medicine Night Resident  Please page [phone] with questions.

## 2021-04-24 NOTE — Progress Notes (Signed)
Patient's insurance authorization was approved by [facility] [id number]. Next review date is 04/25/21.  Patient can be transferred to [facility].  Madilyn Fireman, MSW, LCSW Transitions of Care  Clinical Social Worker II [phone]

## 2021-04-24 NOTE — Plan of Care (Signed)
  Problem: Education: Goal: Knowledge of disease or condition will improve Outcome: Progressing Goal: Knowledge of secondary prevention will improve Outcome: Progressing Goal: Knowledge of patient specific risk factors addressed and post discharge goals established will improve Outcome: Progressing   Problem: Coping: Goal: Will verbalize positive feelings about self Outcome: Progressing Goal: Will identify appropriate support needs Outcome: Progressing   Problem: Self-Care: Goal: Ability to participate in self-care as condition permits will improve Outcome: Progressing Goal: Verbalization of feelings and concerns over difficulty with self-care will improve Outcome: Progressing Goal: Ability to communicate needs accurately will improve Outcome: Progressing   Problem: Nutrition: Goal: Risk of aspiration will decrease Outcome: Progressing   

## 2021-04-24 NOTE — Discharge Summary (Addendum)
[facility] Discharge Summary  Patient name: David [name] Medical record number: [id number] Date of birth: 08-Jan-1956 Age: 65 y.o. Gender: male Date of Admission: 04/05/2021  Date of Discharge: 04/25/21 Admitting Physician: Kinnie Feil, MD  Primary Care Provider: Kinnie Feil, MD Consultants: Neuro, ID  Indication for Hospitalization: altered mental status  Discharge Diagnoses/Problem List:  Principal Problem:   Cerebral thrombosis with cerebral infarction Active Problems:   Cocaine abuse ([place])   Altered mental status   COVID-19   Housing Insecurity   Encephalopathy acute  Disposition: SNF  Discharge Condition: Stable  Discharge Exam:  Blood pressure 116/78, pulse 69, temperature 98 F (36.7 C), temperature source Oral, resp. rate 16, height _0  (1.778 m), weight 64.2 kg, SpO2 100 %.  General: Awake, alert and appropriately responsive in NAD Chest: CTAB, normal WOB.  Heart: RRR, no murmur appreciated Abdomen: Soft, non-tender, non-distended. Normoactive bowel sounds  Extremities: Moves all extremities equally. MSK: Normal bulk and tone Neuro: Appropriately responsive to stimuli. No gross deficits appreciated. Appropriate in conversation. Skin: No rashes or lesions appreciated.    Brief Hospital Course:  David [name] is a 65yo male who presented with altered mental status and sepsis 2/2 COVID-19 infection, now with ?acute to subacute frontal CVA. PMH significant for CVA (July 2022), falls, HLD, HTN, prediabetes, cocaine abuse.  Acute to Subacute CVA Encephalopathy David [name] presented to the [facility] on [facility]/6 after a witnessed fall in the community. Per EMS, he had a fever to 106.6, though rectal temp in the ED was 102.9. On arrival he was somnolent, only intermittently following commands and had rigors. CT Head showed no acute abnormalities but MRI brain raised concern for acute to subacute small vessel infarcts in the left corona radiata and  right frontal white matter which neurology suspected might represent new vs. evolving strokes but would not explain his altered mental status. Follow-up MRA however showed changes consistent with his prior stroke from July 2022. Neuro exam remained non-focal and neurology only recommended further management with DAPT and cardiac monitoring.  On hospital day 2 he was increasingly obtunded, only responding to painful stimuli. Given his high fever and acutely worsened mental status, neurology was concerned for CNS infection. We consulted Infectious Diseases for guidance regarding antimicrobial coverage until we could obtain an LP. We started him on ampicillin, vancomycin, ceftriaxone, and acyclovir.  CSF labs and culture were not concerning for CNS infection and antibiotic coverage was discontinued on 8/10. His HSV resulted on 8/11 and acyclovir was also discontinued. His mental status improved throughout admission.   Asymptomatic COVID +  He was septic on admission and found to be COVID+, though he never required supplemental O2. We initiated treatment with Remdesivir which he tolerated well. By hospital day 3, his mental status was much improved. He never required supplemental O2. He finished his course of Remdesivir on 8/11 and COVID precautions were discontinued on 8/12.  Patient has no longer had symptoms of COVID-19.  He is asymptomatic and outside of his window. He restested COVID negative at time of discharge to SNF.  Elevated LFTs He was noted to have a slight increase in his AST and ALT (105, 51 respectively). His alk phos and bili were normal. We suspected that this was due to alcoholic hepatitis due to his extensive etOH abuse, but wanted to rule out an acute viral process. Acute hepatitis panel was negative. On chart review, this was a new issue as he had normal LFTs upon  presentation for his stroke in July. At that time, his Lipitor dose was increased from 40 mg to 80 mg daily. We considered this  as a potential cause of his transaminitis and switched him to Crestor 32m daily.  After the switch to Crestor his LFTs decreased to normal levels and are stable.  COVID-negative on day of discharge.  PCP Follow-up Recommendations 1) We started him on amlodipine 553mdaily for blood pressure control.   2) Patient appeared depressed throughout this hospitalization. Discussion with his son revealed that this may be longstanding depression compounded by recent stroke. We started him on escitalopram on 8/12.  Creased his Lexapro amount while in the hospital.  3) He will be on DAPT until August 31st, at that time he can cut back to aspirin alone.  4) He is going out with a 30-day cardiac event monitor. Dr. CrSallyanne Kusterill follow-up on this.   5) We changed his atorvastatin to Crestor due to his transaminitis. His LFTs rapidly improved after making this change.   6) Patient will need CBC to recheck WBC count in one week due to neutropenia.   7) Recommend outpatient gastroenterology consultation for proctitis on CT---thought to be due to acute viral etiology vs. Irritation---consider Colonoscopy    Significant Procedures: None  Significant Labs and Imaging:  No results for input(s): WBC, HGB, HCT, PLT in the last 168 hours. Recent Labs  Lab 04/24/21 0406  NA 137  K 4.0  CL 103  CO2 27  GLUCOSE 96  BUN 20  CREATININE 1.06  CALCIUM 8.7*  ALKPHOS 83  AST 28  ALT 25  ALBUMIN 2.9*    Results/Tests Pending at Time of Discharge: None  Discharge Medications:  Allergies as of 04/25/2021   No Known Allergies      Medication List     STOP taking these medications    atorvastatin 80 MG tablet Commonly known as: LIPITOR   senna-docusate 8.6-50 MG tablet Commonly known as: Senokot-S       TAKE these medications    acetaminophen 325 MG tablet Commonly known as: TYLENOL Take 2 tablets (650 mg total) by mouth every 6 (six) hours as needed for fever. What changed:  when to  take this reasons to take this   amLODipine 5 MG tablet Commonly known as: NORVASC Take 1 tablet (5 mg total) by mouth daily.   aspirin 81 MG EC tablet Take 1 tablet (81 mg total) by mouth daily. Swallow whole.   clopidogrel 75 MG tablet Commonly known as: PLAVIX Take 1 tablet (75 mg total) by mouth daily for 5 days.   diclofenac Sodium 1 % Gel Commonly known as: VOLTAREN Apply to right shoulder area four times per day What changed: additional instructions   escitalopram 20 MG tablet Commonly known as: LEXAPRO Take 1 tablet (20 mg total) by mouth daily.   rosuvastatin 40 MG tablet Commonly known as: CRESTOR Take 1 tablet (40 mg total) by mouth daily.   tamsulosin 0.4 MG Caps capsule Commonly known as: FLOMAX Take 1 capsule (0.4 mg total) by mouth daily.        Discharge Instructions: Please refer to Patient Instructions section of EMR for full details.  Patient was counseled important signs and symptoms that should prompt return to medical care, changes in medications, dietary instructions, activity restrictions, and follow up appointments.   Follow-Up Appointments:  Follow-up Information     [facility]. Schedule an appointment as soon as possible for a visit in 1  month(s).   Specialty: Neurology Why: stroke clinic Contact information: [place], [place] Follow up.   Specialty: [place] Why: You are been accepted to [facility] facility for physical rehabilitation services. Contact information: [place] [address] [phone]         Kinnie Feil, MD. Schedule an appointment as soon as possible for a visit.   Specialty: Family Medicine Why: Please follow up with your primary care physician when you are discharged from the skilled nursing facility. Contact information: [place] [address] [phone]                 Wells Guiles, DO 04/25/2021, 11:21 AM PGY-1, [place] Upper-Level Resident Addendum   I have independently interviewed and examined the patient. I have discussed the above with the original author and agree with their documentation. My edits for correction/addition/clarification are included where appropriate. Please see also any attending notes.   Sharion Settler, DO PGY-2, [facility] 04/25/2021 1:35 PM  FPTS Service pager: [phone] (text pages welcome through [place])

## 2021-04-25 DIAGNOSIS — E785 Hyperlipidemia, unspecified: Secondary | ICD-10-CM | POA: Diagnosis not present

## 2021-04-25 DIAGNOSIS — G9340 Encephalopathy, unspecified: Secondary | ICD-10-CM | POA: Diagnosis not present

## 2021-04-25 DIAGNOSIS — I10 Essential (primary) hypertension: Secondary | ICD-10-CM | POA: Diagnosis not present

## 2021-04-25 DIAGNOSIS — Z7401 Bed confinement status: Secondary | ICD-10-CM | POA: Diagnosis not present

## 2021-04-25 DIAGNOSIS — Z1159 Encounter for screening for other viral diseases: Secondary | ICD-10-CM | POA: Diagnosis not present

## 2021-04-25 DIAGNOSIS — N289 Disorder of kidney and ureter, unspecified: Secondary | ICD-10-CM | POA: Diagnosis not present

## 2021-04-25 DIAGNOSIS — I6330 Cerebral infarction due to thrombosis of unspecified cerebral artery: Secondary | ICD-10-CM | POA: Diagnosis not present

## 2021-04-25 DIAGNOSIS — D649 Anemia, unspecified: Secondary | ICD-10-CM | POA: Diagnosis not present

## 2021-04-25 DIAGNOSIS — F32A Depression, unspecified: Secondary | ICD-10-CM | POA: Diagnosis not present

## 2021-04-25 DIAGNOSIS — G459 Transient cerebral ischemic attack, unspecified: Secondary | ICD-10-CM | POA: Diagnosis not present

## 2021-04-25 DIAGNOSIS — Z5900 Homelessness unspecified: Secondary | ICD-10-CM | POA: Diagnosis not present

## 2021-04-25 DIAGNOSIS — R2681 Unsteadiness on feet: Secondary | ICD-10-CM | POA: Diagnosis not present

## 2021-04-25 DIAGNOSIS — R262 Difficulty in walking, not elsewhere classified: Secondary | ICD-10-CM | POA: Diagnosis not present

## 2021-04-25 DIAGNOSIS — I639 Cerebral infarction, unspecified: Secondary | ICD-10-CM | POA: Diagnosis not present

## 2021-04-25 DIAGNOSIS — U071 COVID-19: Secondary | ICD-10-CM | POA: Diagnosis not present

## 2021-04-25 DIAGNOSIS — H9190 Unspecified hearing loss, unspecified ear: Secondary | ICD-10-CM | POA: Diagnosis not present

## 2021-04-25 LAB — MISC LABCORP TEST (SEND OUT): Labcorp test code: 9985

## 2021-04-25 LAB — SARS CORONAVIRUS 2 (TAT 6-24 HRS): SARS Coronavirus 2: NEGATIVE

## 2021-04-25 MED ORDER — ROSUVASTATIN CALCIUM 40 MG PO TABS: 40.0000 mg | ORAL_TABLET | Freq: Every day | ORAL | 0 refills | Status: DC

## 2021-04-25 MED ORDER — ACETAMINOPHEN 325 MG PO TABS: 650.0000 mg | ORAL_TABLET | Freq: Four times a day (QID) | ORAL | 0 refills | Status: AC | PRN

## 2021-04-25 MED ORDER — TAMSULOSIN HCL 0.4 MG PO CAPS
0.4000 mg | ORAL_CAPSULE | Freq: Every day | ORAL | 1 refills | Status: DC
Start: 2021-04-25 — End: 2024-05-30

## 2021-04-25 MED ORDER — AMLODIPINE BESYLATE 5 MG PO TABS: 5.0000 mg | ORAL_TABLET | Freq: Every day | ORAL | 0 refills | Status: DC

## 2021-04-25 MED ORDER — ESCITALOPRAM OXALATE 20 MG PO TABS: 20.0000 mg | ORAL_TABLET | Freq: Every day | ORAL | 1 refills | Status: DC

## 2021-04-25 MED ORDER — ASPIRIN 81 MG PO TBEC: 81.0000 mg | DELAYED_RELEASE_TABLET | Freq: Every day | ORAL | 0 refills | Status: DC

## 2021-04-25 MED ORDER — CLOPIDOGREL BISULFATE 75 MG PO TABS: 75.0000 mg | ORAL_TABLET | Freq: Every day | ORAL | 0 refills | Status: AC

## 2021-04-25 MED ORDER — DICLOFENAC SODIUM 1 % EX GEL: CUTANEOUS | 1 refills | Status: DC

## 2021-04-25 NOTE — Progress Notes (Signed)
Report called to [place] at [place] at this time.  All questions answered.  Awaiting PTAR for transportation.

## 2021-04-25 NOTE — TOC Transition Note (Deleted)
Transition of Care [facility]) - CM/SW Discharge Note   Patient Details  Name: David [name] MRN: [id number] Date of Birth: 1956/01/07  Transition of Care [facility]) CM/SW Contact:  Curlene Labrum, RN Phone Number: 04/25/2021, 8:23 AM   Clinical Narrative:    CM spoke with Monia Pouch, MSW with [facility] and the facility is expecting the patient for admission to the facility today.  COVID screen is pending and last screen drawn on 04/23/2021 was negative.  Bedside nursing can call nursing report to the facility once PTAR is arranged and bed assignment is confirmed at the facility.  Please call nursing report to [place] facility at [phone].  CM and MSW will continue to follow the patient today for transfer to the skilled nursing facility.   Final next level of care: [place] Barriers to Discharge: No Barriers Identified   Patient Goals and CMS Choice Patient states their goals for this hospitalization and ongoing recovery are:: Patient will be admitted to [facility] skilled nursing facility. CMS Medicare.gov Compare Post Acute Care list provided to:: Patient Choice offered to / list presented to : Adult Children  Discharge Placement                       Discharge Plan and Services In-house Referral: Clinical Social Work Discharge Planning Services: CM Consult Post Acute Care Choice: Home Health                               Social Determinants of Health (SDOH) Interventions     Readmission Risk Interventions No flowsheet data found.

## 2021-04-25 NOTE — Progress Notes (Signed)
Pt discharged via PTAR at this time.  Has all belongings with him.

## 2021-04-25 NOTE — Plan of Care (Signed)
  Problem: Education: Goal: Knowledge of disease or condition will improve Outcome: Progressing Goal: Knowledge of secondary prevention will improve Outcome: Progressing Goal: Knowledge of patient specific risk factors addressed and post discharge goals established will improve Outcome: Progressing   Problem: Coping: Goal: Will verbalize positive feelings about self Outcome: Progressing Goal: Will identify appropriate support needs Outcome: Progressing   Problem: Health Behavior/Discharge Planning: Goal: Ability to manage health-related needs will improve Outcome: Progressing   Problem: Self-Care: Goal: Ability to participate in self-care as condition permits will improve Outcome: Progressing   Problem: Nutrition: Goal: Risk of aspiration will decrease Outcome: Progressing   Problem: Ischemic Stroke/TIA Tissue Perfusion: Goal: Complications of ischemic stroke/TIA will be minimized Outcome: Progressing

## 2021-04-25 NOTE — TOC Transition Note (Addendum)
Transition of Care [facility]) - CM/SW Discharge Note   Patient Details  Name: ZERIC BARANOWSKI MRN: [id number] Date of Birth: 11-Nov-1955  Transition [facility]) CM/SW Contact:  Curlene Labrum, RN Phone Number: 04/25/2021, 10:14 AM   Clinical Narrative:    CM spoke with Monia Pouch, CM and [place] and the patient will be admitted and transferred to the facility by PTAR once discharge summary and orders are complete.  I spoke with the patient and he is aware that he will transfer to the facility in the next couple of hours by PTAR transport.  Bedside nursing can call report to [facility] at [phone] for Room # 21B.  PTAR packet was placed at the secretary's desk and PTAR transport will be called once discharge summary and orders are completed by the attending physician.  04/25/2021  1224 - Discharge summary and orders are noted.  PTAR was called for transport and AVS was placed in the PTAR packet along with facesheet and medical necessity.   Discharge Summary was uploaded in the hub for [place], MSW was notified.  04/25/2021 1338 - New discharge summary was placed in the hub for the facility.  [place] was notified of the pending transport to the facility.   Final next level of care: [place] Barriers to Discharge: No Barriers Identified   Patient Goals and CMS Choice Patient states their goals for this hospitalization and ongoing recovery are:: Patient will be admitted to [facility] skilled nursing facility. CMS Medicare.gov Compare Post Acute Care list provided to:: Patient Choice offered to / list presented to : Adult Children  Discharge Placement                       Discharge Plan and Services In-house Referral: Clinical Social Work Discharge Planning Services: CM Consult Post Acute Care Choice: Home Health                               Social Determinants  of Health (SDOH) Interventions     Readmission Risk Interventions No flowsheet data found.

## 2021-05-02 DIAGNOSIS — D649 Anemia, unspecified: Secondary | ICD-10-CM | POA: Diagnosis not present

## 2021-05-12 DIAGNOSIS — I6930 Unspecified sequelae of cerebral infarction: Secondary | ICD-10-CM | POA: Diagnosis not present

## 2021-05-12 DIAGNOSIS — R262 Difficulty in walking, not elsewhere classified: Secondary | ICD-10-CM | POA: Diagnosis not present

## 2021-05-12 DIAGNOSIS — Z8616 Personal history of COVID-19: Secondary | ICD-10-CM | POA: Diagnosis not present

## 2021-05-12 DIAGNOSIS — G9340 Encephalopathy, unspecified: Secondary | ICD-10-CM | POA: Diagnosis not present

## 2021-05-12 DIAGNOSIS — I10 Essential (primary) hypertension: Secondary | ICD-10-CM | POA: Diagnosis not present

## 2021-05-12 DIAGNOSIS — R2681 Unsteadiness on feet: Secondary | ICD-10-CM | POA: Diagnosis not present

## 2021-06-19 ENCOUNTER — Other Ambulatory Visit: Payer: Self-pay

## 2021-06-19 ENCOUNTER — Ambulatory Visit (INDEPENDENT_AMBULATORY_CARE_PROVIDER_SITE_OTHER): Payer: Medicare Other | Admitting: Adult Health

## 2021-06-19 ENCOUNTER — Encounter: Payer: Self-pay | Admitting: Adult Health

## 2021-06-19 VITALS — BP 126/88 | HR 90 | Ht 70.0 in | Wt 158.0 lb

## 2021-06-19 DIAGNOSIS — R7309 Other abnormal glucose: Secondary | ICD-10-CM

## 2021-06-19 DIAGNOSIS — I10 Essential (primary) hypertension: Secondary | ICD-10-CM | POA: Diagnosis not present

## 2021-06-19 DIAGNOSIS — E785 Hyperlipidemia, unspecified: Secondary | ICD-10-CM

## 2021-06-19 DIAGNOSIS — F1910 Other psychoactive substance abuse, uncomplicated: Secondary | ICD-10-CM

## 2021-06-19 DIAGNOSIS — R7989 Other specified abnormal findings of blood chemistry: Secondary | ICD-10-CM

## 2021-06-19 DIAGNOSIS — I639 Cerebral infarction, unspecified: Secondary | ICD-10-CM | POA: Diagnosis not present

## 2021-06-19 MED ORDER — BACLOFEN 5 MG PO TABS: 5.0000 mg | ORAL_TABLET | Freq: Three times a day (TID) | ORAL | 5 refills | Status: DC

## 2021-06-19 NOTE — Patient Instructions (Signed)
Continue aspirin 81 mg daily  and Cretsor 40mg  daily  for secondary stroke prevention  Check blood work today including lipid panel, A1c and CMP  Recommend starting baclofen 5 mg 3 times daily for right spastic hemiparesis -please monitor for possible side effects or potential need of dosage increase   Continue working with therapies for hopeful ongoing improvement  Continue to follow up with PCP regarding cholesterol and blood pressure management  Maintain strict control of hypertension with blood pressure goal below 130/90, and cholesterol with LDL cholesterol (bad cholesterol) goal below 70 mg/dL.       Followup in the future with me in 4-5 months or call earlier if needed       Thank you for coming to see [place] at [facility]. I hope we have been able to provide you high quality care today.  You may receive a patient satisfaction survey over the next few weeks. We would appreciate your feedback and comments so that we may continue to improve ourselves and the health of our patients.

## 2021-06-19 NOTE — Progress Notes (Signed)
[facility] [address] [place]. [place] [address] [phone]       HOSPITAL FOLLOW UP NOTE  [name] Date of Birth:  08-29-1956 Medical Record Number:  [id number]   Reason for Referral:  hospital stroke follow up    SUBJECTIVE:   CHIEF COMPLAINT:  Chief Complaint  Patient presents with   Follow-up    RM 3 alone Pt is well and stable, no concerns     HPI:   [name] is a 65 y.o. male with history of cocaine use, hypertension, hyperlipidemia, prediabetes, and hx of L BG/CR stroke in 07/2017 with residual mild left-sided weakness who presented on 03/13/2021 s/p fall and altered mental status.  Personally reviewed hospitalization pertinent progress notes, lab work and imaging.  MRI showed left periventricular white matter and left MCA/PCA white matter punctate infarcts secondary to small vessel disease source as well as evidence of old infarcts of bilateral BG/CR and pons.  CTA head/neck left A2 stenosis and possible BA moderate stenosis.  EF 65 to 70%.  LDL 133.  A1c 6.4.  UDS negative but admitted cocaine use 2 months prior.  Recommended DAPT for 3 weeks and aspirin alone as well as initiated atorvastatin 40 mg daily.  Therapy eval's recommended outpatient OT although homeless.   Readmitted 04/05/2021 after a fall witnessed by bystander with confusion and strong smell of urine per EMS. Per note, ED called family and son reports that patient is still homeless.  MRI showed left corona radiata and right frontal white matter infarcts secondary to small vessel disease given risk factors, continue cocaine use and noncompliance with medications.  Also noted encephalopathy likely related to COVID-positive, fever, stroke and substance abuse.  CNS infection ruled out.  Discharged on DAPT but apparently noncompliant.  UDS positive for cocaine.  MRA similar appearance to prior CTA.  LDL 103.  A1c 6.4.  Recommended DAPT for 3 weeks and aspirin alone (as non  compliant after recent discharge).  Previously discharged on atorvastatin 80 mg daily although evidence of elevated LFTs and after switching to Crestor 40 mg daily, LFTs drastically improved. Therapy eval recommended SNF for continued impaired cognition and gait impairment.  He was discharged to [facility] on 04/25/2021  Today, 06/19/2021, David [name] is being seen for hospital follow-up unaccompanied.  He continues to reside at [facility].  Overall stable.  Reports working with therapies for balance which has been improving and current use of cane (not using prior). Denies any recent falls. Denies cognitive difficulties.  Denies new stroke/TIA symptoms.  Per MAR, currently on aspirin and Crestor 40mg  daily. Blood pressure today 126/88. Monitors at SNF which has been stable per patient.  Denies recent lab work.  No concerns at this time.    PERTINENT IMAGING  CT HEAD  03/13/2021 IMPRESSION: 1. Right frontal scalp laceration and crescentic scalp hematoma. No subjacent calvarial fracture. 2. No acute intracranial abnormality. No acute cervical spine fracture or traumatic listhesis. 3. Straightening and reversal the cervical spine may be related to positioning or muscle spasm. 4. Chronic lacunar type infarcts and extensive microvascular angiopathy and features of prior left wallerian degeneration. 5. Multilevel cervical spondylitic changes, as detailed above. 6. Lucency and coarsened trabecular bone at the T2 vertebral body, appearance most consistent with a large vertebral body hemangioma. 7. Arthrosis at the atlantodental interval with some calcific pannus formation posterior to the dens, nonspecific though can be seen as early manifestation of CPPD or rheumatoid  arthropathy.  04/05/2021 IMPRESSION: No acute abnormality noted. Chronic ischemic changes are noted.   MR BRAIN WO CONTRAST 03/13/2021 IMPRESSION: 1. Scattered small areas of acute infarct in the left frontal  deep white matter and left frontal parietal periventricular white matter. 2. Extensive chronic small vessel ischemic changes. 3. No intracranial hemorrhage. Moderately large right frontal scalp contusion with laceration.  04/06/2021 IMPRESSION: 1. Acute to subacute small vessel infarcts in the left corona radiata and right frontal white matter. 2. Background of advanced chronic small vessel disease.   CTA HEAD/NECK 03/13/2021 IMPRESSION: CTA Head: 1. No large vessel occlusion. 2. Moderate left A2 ACA stenosis, possibly multifocal. 3. Motion limited evaluation of basilar artery with suspected moderate stenosis. 4. Small vertebrobasilar system with bilateral prominent posterior communicating arteries and small P1 PCAs, anatomic variant.   CTA Neck: 1. No significant (greater than 50%) stenosis. 2. Mild-to-moderate bilateral carotid bifurcation atherosclerosis. 3. Emphysema.   MRA HEAD  04/06/2021 IMPRESSION: 1. Moderately motion degraded. 2. No emergent finding or convincing change from a CTA 3 weeks ago.      ROS:   14 system review of systems performed and negative with exception of those listed in HPI  PMH:  Past Medical History:  Diagnosis Date   AKI (acute kidney injury) ([place]) 09/01/2017   Benign prostatic hyperplasia    Cerebral infarction ([place]) 08/04/2017   Late acute/ Early subacute infarction centered left putamen,corona radiata, and caudate body measuring 5.1 cc.   Cerebrovascular accident (CVA) ([place]) 03/13/2021   Lacunar infarction ([place])    MRI 08/04/2017: Chronic lacunar infarcts in left pons andright anterior basal ganglia.   Lipoma of scalp    Head CT 02/2021    PSH:  Past Surgical History:  Procedure Laterality Date   SHOULDER ARTHROSCOPY W/ ROTATOR CUFF REPAIR  10/21/10   At [facility], R shoulder    Social History:  Social History   Socioeconomic History   Marital status: Single    Spouse name: Not on file   Number of children: Not on file    Years of education: Not on file   Highest education level: Not on file  Occupational History   Not on file  Tobacco Use   Smoking status: Never   Smokeless tobacco: Never  Substance and Sexual Activity   Alcohol use: No    Comment: occasion   Drug use: No   Sexual activity: Not on file  Other Topics Concern   Not on file  Social History Narrative   Not on file   Social Determinants of Health   Financial Resource Strain: Not on file  Food Insecurity: Not on file  Transportation Needs: Not on file  Physical Activity: Not on file  Stress: Not on file  Social Connections: Not on file  Intimate Partner Violence: Not on file    Family History: No family history on file.  Medications:   Current Outpatient Medications on File Prior to Visit  Medication Sig Dispense Refill   acetaminophen (TYLENOL) 325 MG tablet Take 650 mg by mouth every 6 (six) hours as needed.     amLODipine (NORVASC) 5 MG tablet Take 1 tablet (5 mg total) by mouth daily. 30 tablet 0   aspirin EC 81 MG EC tablet Take 1 tablet (81 mg total) by mouth daily. Swallow whole. 30 tablet 0   escitalopram (LEXAPRO) 20 MG tablet Take 1 tablet (20 mg total) by mouth daily. 30 tablet 1   famotidine (PEPCID) 20 MG tablet Take 20 mg by  mouth 2 (two) times daily.     rosuvastatin (CRESTOR) 40 MG tablet Take 1 tablet (40 mg total) by mouth daily. 30 tablet 0   tamsulosin (FLOMAX) 0.4 MG CAPS capsule Take 1 capsule (0.4 mg total) by mouth daily. 30 capsule 1   No current facility-administered medications on file prior to visit.    Allergies:  No Known Allergies    OBJECTIVE:  Physical Exam  Vitals:   06/19/21 0749  BP: 126/88  Pulse: 90  Weight: 158 lb (71.7 kg)  Height: 5\' 10"  (1.778 m)   Body mass index is 22.67 kg/m. No results found.  General: well developed, well nourished, pleasant middle-aged African-American male, seated, in no evident distress Head: head normocephalic and atraumatic.   Neck: supple  with no carotid or supraclavicular bruits Cardiovascular: regular rate and rhythm, no murmurs Musculoskeletal: no deformity Skin:  no rash/petichiae Vascular:  Normal pulses all extremities   Neurologic Exam Mental Status: Awake and fully alert.  Mild dysarthria (although per pt, baseline).  No evidence of aphasia.  Oriented to place and time. Recent mildly impaired and remote memory intact. Attention span, concentration and fund of knowledge slightly impaired. Mood appropriate although flat affect Cranial Nerves: Fundoscopic exam reveals sharp disc margins. Pupils equal, briskly reactive to light. Extraocular movements full without nystagmus. Visual fields full to confrontation. Hearing intact. Facial sensation intact. Face, tongue, palate moves normally and symmetrically.  Motor: Normal bulk and tone. Normal strength in all tested extremity muscles except increased tone RUE (with limited ROM) and RLE (difficulty fully assessing hip flexor d/t pain) Sensory.: intact to touch , pinprick , position and vibratory sensation.  Coordination: Rapid alternating movements normal in all extremities. Finger-to-nose and heel-to-shin performed accurately bilaterally. Gait and Station: Arises from chair without difficulty. Stance is normal. Gait demonstrates decreased stride length and step height with mild unsteadiness and use of cane.  Tandem walk and heel toe not attempted.  Reflexes: 1+ and symmetric. Toes downgoing.     NIHSS  1 Modified Rankin  3      ASSESSMENT: David [name] is a 65 y.o. year old male left periventricular white matter and left MCA/PCA white matter punctate infarcts on 03/13/2021 and left corona radiata and right frontal white matter infarcts on 04/05/2021 likely secondary to small vessel disease given risk factors, continued cocaine use and medication noncompliance. Vascular risk factors include history of prior strokes, HTN, HLD, DM, cocaine use, EtOH use, COVID-positive 04/05/2021.       PLAN:  Recurrent strokes:  Residual deficit: Right-sided spasticity, gait impairment and likely cognitive impairment. Start baclofen 5 mg 3 times daily for spasticity.  Discussed potential side effects and requested SNF monitor for potential side effects or possible need of dosage increase.  Continue working with therapies at [facility].  Continue use of cane at all times unless otherwise instructed Continue aspirin 81 mg daily  and Crestor 40 mg daily for secondary stroke prevention.   Discussed secondary stroke prevention measures and importance of close PCP follow up for aggressive stroke risk factor management. I have gone over the pathophysiology of stroke, warning signs and symptoms, risk factors and their management in some detail with instructions to go to the closest emergency room for symptoms of concern. HTN: BP goal <130/90.  Stable on amlodipine per facility HLD: LDL goal <70. Recent LDL 103.  Continue Crestor 40 mg daily.  Repeat lipid panel today Elevated A1c: A1c goal<7.0. Recent A1c 6.4.  Repeat A1c today Elevated LFTs: During  recent admission stabilized at discharge after switching from atorvastatin to Crestor.  Repeat LFTs today.   Polysubstance abuse: Currently not using as in SNF.  Discussed importance of refraining if/when he returns home as continued use greatly increases risk of recurrent strokes    Follow up in 4-5 months or call earlier if needed   CC:  [facility] provider: Dr. Leonie Man PCP: Kinnie Feil, MD    I spent 54 minutes of face-to-face and non-face-to-face time with patient.  This included previsit chart review including review of recent hospitalization, lab review, study review, order entry, electronic health record documentation, patient education regarding recent stroke including etiology, secondary stroke prevention measures and importance of managing stroke risk factors, residual deficits and typical recovery time and answered all other questions to patient  satisfaction  Frann Rider, AGNP-BC  [facility] [address] [place], [place] [address]  Phone [phone] Fax [phone] Note: This document was prepared with digital dictation and possible smart phrase technology. Any transcriptional errors that result from this process are unintentional.

## 2021-06-19 NOTE — Progress Notes (Signed)
I agree with the above plan 

## 2021-06-20 LAB — COMPREHENSIVE METABOLIC PANEL
ALT: 20 IU/L (ref 0–44)
AST: 25 IU/L (ref 0–40)
Albumin/Globulin Ratio: 1.1 — ABNORMAL LOW (ref 1.2–2.2)
Albumin: 4.4 g/dL (ref 3.8–4.8)
Alkaline Phosphatase: 123 IU/L — ABNORMAL HIGH (ref 44–121)
BUN/Creatinine Ratio: 14 (ref 10–24)
BUN: 15 mg/dL (ref 8–27)
Bilirubin Total: <0.2 mg/dL (ref 0.0–1.2)
CO2: 23 mmol/L (ref 20–29)
Calcium: 9.1 mg/dL (ref 8.6–10.2)
Chloride: 102 mmol/L (ref 96–106)
Creatinine, Ser: 1.04 mg/dL (ref 0.76–1.27)
Globulin, Total: 4 g/dL (ref 1.5–4.5)
Glucose: 99 mg/dL (ref 70–99)
Potassium: 4.4 mmol/L (ref 3.5–5.2)
Sodium: 140 mmol/L (ref 134–144)
Total Protein: 8.4 g/dL (ref 6.0–8.5)
eGFR: 79 mL/min/{1.73_m2} (ref >59)

## 2021-06-20 LAB — LIPID PANEL
Chol/HDL Ratio: 2.6 ratio (ref 0.0–5.0)
Cholesterol, Total: 159 mg/dL (ref 100–199)
HDL: 61 mg/dL (ref >39)
LDL Chol Calc (NIH): 78 mg/dL (ref 0–99)
Triglycerides: 110 mg/dL (ref 0–149)
VLDL Cholesterol Cal: 20 mg/dL (ref 5–40)

## 2021-06-20 LAB — HEMOGLOBIN A1C
Est. average glucose Bld gHb Est-mCnc: 137 mg/dL
Hgb A1c MFr Bld: 6.4 % — ABNORMAL HIGH (ref 4.8–5.6)

## 2021-06-23 ENCOUNTER — Telehealth: Payer: Self-pay | Admitting: *Deleted

## 2021-06-23 NOTE — Telephone Encounter (Signed)
called # listed as home, reached his SNF , [facility]. Spoke with his nurse, Janett Billow and advised her of lab results. She asked for a copy to be faxed for his records. Copy of recent labs faxed to facility. Janett Billow will inform patient, verbalized understanding, appreciation.

## 2021-07-04 ENCOUNTER — Ambulatory Visit (INDEPENDENT_AMBULATORY_CARE_PROVIDER_SITE_OTHER): Payer: Medicare Other | Admitting: Cardiovascular Disease

## 2021-07-04 ENCOUNTER — Other Ambulatory Visit: Payer: Self-pay

## 2021-07-04 ENCOUNTER — Encounter: Payer: Self-pay | Admitting: Radiology

## 2021-07-04 ENCOUNTER — Encounter: Payer: Self-pay | Admitting: Cardiovascular Disease

## 2021-07-04 VITALS — BP 116/64 | HR 77 | Ht 70.0 in | Wt 158.0 lb

## 2021-07-04 DIAGNOSIS — R7303 Prediabetes: Secondary | ICD-10-CM | POA: Diagnosis not present

## 2021-07-04 DIAGNOSIS — I639 Cerebral infarction, unspecified: Secondary | ICD-10-CM

## 2021-07-04 DIAGNOSIS — I10 Essential (primary) hypertension: Secondary | ICD-10-CM | POA: Diagnosis not present

## 2021-07-04 DIAGNOSIS — I700 Atherosclerosis of aorta: Secondary | ICD-10-CM

## 2021-07-04 DIAGNOSIS — E7800 Pure hypercholesterolemia, unspecified: Secondary | ICD-10-CM

## 2021-07-04 DIAGNOSIS — I6523 Occlusion and stenosis of bilateral carotid arteries: Secondary | ICD-10-CM

## 2021-07-04 DIAGNOSIS — F1411 Cocaine abuse, in remission: Secondary | ICD-10-CM

## 2021-07-04 NOTE — Progress Notes (Signed)
Cardiology Office Note:    Date:  07/06/2021   ID:  David [name], DOB [birth year], MRN [id number]  PCP:  Kinnie Feil, MD   [facility] Providers Cardiologist:  None     Referring MD: Kinnie Feil, MD   No chief complaint on file. David [name] is a 65 y.o. male who is being seen today for the evaluation of cryptogenic stroke at the request of Kinnie Feil, MD.   History of Present Illness:    [name] is a 65 y.o. male with a hx of recurrent stroke in 2018 (left basal ganglia/corona radiata) and July 2022  (left frontal and parietal white matter) and August 2022 (acute to subacute small vessel infarct in the left corona radiata and right frontal white matter on a "background of advanced chronic small vessel disease").    The most recent stroke occurred after recent cocaine use. He was hospitalized with sepsis and encephalopathy in the setting of COVID-19 infection.  The viral infection was felt most likely to be coincidental and asymptomatic.  The exact cause of sepsis was not clear.  Urine drug screen was positive for cocaine in August, but not in July.  CT angiogram of the head and neck shows small vessel intracranial stenoses.  He has atherosclerosis in the carotid arteries and in the visualized portions of the aorta, but he does not have any significant carotid stenosis.  He was supposed to have an event monitor to screen for atrial fibrillation before this appointment, but it looks like this has not yet been performed.  He has a history of cholesterol elevation.  His dose of atorvastatin was increased from 40 to 80 mg after his stroke in July and he had elevated liver function tests when he presented with sepsis in August.  It is unclear whether the transaminase elevation was due to the statin or sepsis but he was switched to rosuvastatin and his labs improved.  Additional medical problems include treated hypertension and hypercholesterolemia.  He does not have  diabetes mellitus, but hemoglobin A1c is borderline at 6.4%.  He has a longstanding history of polysubstance abuse, including alcohol and cocaine.  He is currently living in a facility and has been clean and sober since hospital discharge.  The echocardiogram performed during his July hospitalization was a normal study.  He is very quiet and volunteers little information except when asked directly.  He has no cardiovascular complaints.  He reports good recovery from his stroke.  The patient specifically denies any chest pain at rest exertion, dyspnea at rest or with exertion, orthopnea, paroxysmal nocturnal dyspnea, syncope, palpitations, new focal neurological deficits, intermittent claudication, lower extremity edema, unexplained weight gain, cough, hemoptysis or wheezing.   Past Medical History:  Diagnosis Date   AKI (acute kidney injury) ([place]) 09/01/2017   Benign prostatic hyperplasia    Cerebral infarction ([place]) 08/04/2017   Late acute/ Early subacute infarction centered left putamen,corona radiata, and caudate body measuring 5.1 cc.   Cerebrovascular accident (CVA) ([place]) 03/13/2021   Lacunar infarction ([place])    MRI 08/04/2017: Chronic lacunar infarcts in left pons andright anterior basal ganglia.   Lipoma of scalp    Head CT 02/2021    Past Surgical History:  Procedure Laterality Date   SHOULDER ARTHROSCOPY W/ ROTATOR CUFF REPAIR  10/21/10   At [facility], R shoulder    Current Medications: Current Meds  Medication Sig   acetaminophen (TYLENOL) 325 MG tablet Take 650 mg by mouth every  6 (six) hours as needed.   amLODipine (NORVASC) 5 MG tablet Take 1 tablet (5 mg total) by mouth daily.   aspirin EC 81 MG EC tablet Take 1 tablet (81 mg total) by mouth daily. Swallow whole.   Baclofen 5 MG TABS Take 5 mg by mouth in the morning, at noon, and at bedtime.   escitalopram (LEXAPRO) 20 MG tablet Take 1 tablet (20 mg total) by mouth daily.   famotidine (PEPCID) 20 MG tablet Take 20 mg by mouth  2 (two) times daily.   rosuvastatin (CRESTOR) 40 MG tablet Take 1 tablet (40 mg total) by mouth daily.   tamsulosin (FLOMAX) 0.4 MG CAPS capsule Take 1 capsule (0.4 mg total) by mouth daily.     Allergies:   Patient has no known allergies.   Social History   Socioeconomic History   Marital status: Single    Spouse name: Not on file   Number of children: Not on file   Years of education: Not on file   Highest education level: Not on file  Occupational History   Not on file  Tobacco Use   Smoking status: Never   Smokeless tobacco: Never  Substance and Sexual Activity   Alcohol use: No    Comment: occasion   Drug use: No   Sexual activity: Not on file  Other Topics Concern   Not on file  Social History Narrative   Not on file   Social Determinants of Health   Financial Resource Strain: Not on file  Food Insecurity: Not on file  Transportation Needs: Not on file  Physical Activity: Not on file  Stress: Not on file  Social Connections: Not on file     Family History: The patient's family history significantly negative for known premature onset CAD or other heart disease.  ROS:   Please see the history of present illness.     All other systems reviewed and are negative.  EKGs/Labs/Other Studies Reviewed:    The following studies were reviewed today: Echocardiogram 03/13/2021   1. Left ventricular ejection fraction, by estimation, is 65 to 70%. The  left ventricle has hyperdynamic function. The left ventricle has no  regional wall motion abnormalities. Left ventricular diastolic parameters  are consistent with Grade I diastolic  dysfunction (impaired relaxation).   2. Right ventricular systolic function is normal. The right ventricular  size is normal. Tricuspid regurgitation signal is inadequate for assessing  PA pressure.   3. The mitral valve is normal in structure. No evidence of mitral valve  regurgitation. No evidence of mitral stenosis.   4. The aortic  valve is tricuspid. Aortic valve regurgitation is not  visualized. No aortic stenosis is present.   5. The inferior vena cava is normal in size with <50% respiratory  variability, suggesting right atrial pressure of 8 mmHg.   CT angiogram of the head and neck 03/13/2021  CTA Head:   1. No large vessel occlusion. 2. Moderate left A2 ACA stenosis, possibly multifocal. 3. Motion limited evaluation of basilar artery with suspected moderate stenosis. 4. Small vertebrobasilar system with bilateral prominent posterior communicating arteries and small P1 PCAs, anatomic variant.   CTA Neck:   1. No significant (greater than 50%) stenosis. 2. Mild-to-moderate bilateral carotid bifurcation atherosclerosis. 3. Emphysema.  EKG:  EKG is not ordered today.  The ekg ordered 04/05/2021 demonstrates sinus tachycardia but otherwise a normal tracing.  Multiple other ECGs are also reviewed and show similar findings  Recent Labs: 03/13/2021: TSH 1.928  04/10/2021: Hemoglobin 11.6; Platelets 178 06/19/2021: ALT 20; BUN 15; Creatinine, Ser 1.04; Potassium 4.4; Sodium 140  Recent Lipid Panel    Component Value Date/Time   CHOL 159 06/19/2021 0820   TRIG 110 06/19/2021 0820   HDL 61 06/19/2021 0820   CHOLHDL 2.6 06/19/2021 0820   CHOLHDL 3.7 04/06/2021 0800   VLDL 11 04/06/2021 0800   LDLCALC 78 06/19/2021 0820     Risk Assessment/Calculations:           Physical Exam:    VS:  BP 116/64   Pulse 77   Ht 5\' 10"  (1.778 m)   Wt 158 lb (71.7 kg)   SpO2 98%   BMI 22.67 kg/m     Wt Readings from Last 3 Encounters:  07/04/21 158 lb (71.7 kg)  06/19/21 158 lb (71.7 kg)  04/18/21 141 lb 8.6 oz (64.2 kg)     GEN: Lean, well nourished, well developed in no acute distress HEENT: Normal NECK: No JVD; No carotid bruits LYMPHATICS: No lymphadenopathy CARDIAC: RRR, no murmurs, rubs, gallops RESPIRATORY:  Clear to auscultation without rales, wheezing or rhonchi  ABDOMEN: Soft, non-tender,  non-distended MUSCULOSKELETAL:  No edema; No deformity  SKIN: Warm and dry NEUROLOGIC:  Alert and oriented x 3 PSYCHIATRIC:  Normal affect   ASSESSMENT:    1. Ischemic stroke ([place])   2. Essential hypertension   3. Hypercholesterolemia   4. Prediabetes   5. Atherosclerosis of both carotid arteries   6. Atherosclerosis of aorta ([place])   7. History of cocaine abuse ([place])    PLAN:    In order of problems listed above:  CVA: Mr. Eakle has had multiple strokes and has a background suggestive of severe small vessel cerebrovascular disease.  He has a history of intermittently treated hypertension and hyperlipidemia and polysubstance use including cocaine.  He does not have evidence of significant carotid stenoses, but he does have carotid and aortic aortic atherosclerosis.  His transthoracic echocardiogram does not show meaningful structural abnormalities and his atria are not dilated.  Recommend a wearable arrhythmia monitor to screen for atrial fibrillation.  Continue antihypertensive and lipid-lowering therapy.  Avoid cocaine. HTN: Well-controlled HLP: LDL 78, close to target of less than 70.  Has an excellent HDL. PreDM: Despite being lean, hemoglobin A1c is 6.4%.  Avoid weight gain, alcohol, important to be physically active. Carotid/aortic atherosclerosis: No flow-limiting carotid stenoses.  Diseases in the small intracranial vessels.  Normal caliber aorta.  No symptoms of PAD.  Lipid-lowering therapy.  Avoid excessive blood pressure lowering.           Medication Adjustments/Labs and Tests Ordered: Current medicines are reviewed at length with the patient today.  Concerns regarding medicines are outlined above.  Orders Placed This Encounter  Procedures   Cardiac event monitor   EKG 12-Lead   No orders of the defined types were placed in this encounter.   Patient Instructions  Medication Instructions:  No changes *If you need a refill on your cardiac medications before your  next appointment, please call your pharmacy*   Lab Work: None ordered If you have labs (blood work) drawn today and your tests are completely normal, you will receive your results only by: [place] (if you have MyChart) OR A paper copy in the mail If you have any lab test that is abnormal or we need to change your treatment, we will call you to review the results.   Testing/Procedures: [employer] Cardiac Event Monitor Instructions Your physician has requested  you wear your cardiac event monitor for __30___ days, (1-30). [employer] may call or text to confirm a shipping address. The monitor will be sent to a land address via UPS. [employer] will not ship a monitor to a PO BOX. It typically takes 3-5 days to receive your monitor after it has been enrolled. [employer] will assist with USPS tracking if your package is delayed. The telephone number for [employer] is [phone]. Once you have received your monitor, please review the enclosed instructions. Instruction tutorials can also be viewed under help and settings on the enclosed cell phone. Your monitor has already been registered assigning a specific monitor serial # to you.  Applying the monitor Remove cell phone from case and turn it on. The cell phone works as Dealer and needs to be within [employer] of you at all times. The cell phone will need to be charged on a daily basis. We recommend you plug the cell phone into the enclosed charger at your bedside table every night.  Monitor batteries: You will receive two monitor batteries labelled #1 and #2. These are your recorders. Plug battery #2 onto the second connection on the enclosed charger. Keep one battery on the charger at all times. This will keep the monitor battery deactivated. It will also keep it fully charged for when you need to switch your monitor batteries. A small light will be blinking on the battery emblem when it is charging. The light on  the battery emblem will remain on when the battery is fully charged.  Open package of a Monitor strip. Insert battery #1 into black hood on strip and gently squeeze monitor battery onto connection as indicated in instruction booklet. Set aside while preparing skin.  Choose location for your strip, vertical or horizontal, as indicated in the instruction booklet. Shave to remove all hair from location. There cannot be any lotions, oils, powders, or colognes on skin where monitor is to be applied. Wipe skin clean with enclosed Saline wipe. Dry skin completely.  Peel paper labeled #1 off the back of the Monitor strip exposing the adhesive. Place the monitor on the chest in the vertical or horizontal position shown in the instruction booklet. One arrow on the monitor strip must be pointing upward. Carefully remove paper labeled #2, attaching remainder of strip to your skin. Try not to create any folds or wrinkles in the strip as you apply it.  Firmly press and release the circle in the center of the monitor battery. You will hear a small beep. This is turning the monitor battery on. The heart emblem on the monitor battery will light up every 5 seconds if the monitor battery in turned on and connected to the patient securely. Do not push and hold the circle down as this turns the monitor battery off. The cell phone will locate the monitor battery. A screen will appear on the cell phone checking the connection of your monitor strip. This may read poor connection initially but change to good connection within the next minute. Once your monitor accepts the connection you will hear a series of 3 beeps followed by a climbing crescendo of beeps. A screen will appear on the cell phone showing the two monitor strip placement options. Touch the picture that demonstrates where you applied the monitor strip.  Your monitor strip and battery are waterproof. You are able to shower, bathe, or swim with the  monitor on. They just ask you do not submerge deeper than 3 feet underwater.  We recommend removing the monitor if you are swimming in a lake, river, or ocean.  Your monitor battery will need to be switched to a fully charged monitor battery approximately once a week. The cell phone will alert you of an action which needs to be made.  On the cell phone, tap for details to reveal connection status, monitor battery status, and cell phone battery status. The green dots indicates your monitor is in good status. A red dot indicates there is something that needs your attention.  To record a symptom, click the circle on the monitor battery. In 30-60 seconds a list of symptoms will appear on the cell phone. Select your symptom and tap save. Your monitor will record a sustained or significant arrhythmia regardless of you clicking the button. Some patients do not feel the heart rhythm irregularities. [employer] will notify us of any serious or critical events.  Refer to instruction booklet for instructions on switching batteries, changing strips, the Do not disturb or Pause features, or any additional questions.  Call [employer] at [phone], to confirm your monitor is transmitting and record your baseline. They will answer any questions you may have regarding the monitor instructions at that time.  Returning the monitor to [place] all equipment back into blue box. Peel off strip of paper to expose adhesive and close box securely. There is a prepaid UPS shipping label on this box. Drop in a UPS drop box, or at a UPS facility like [employer]. You may also contact [employer] to arrange UPS to pick up monitor package at your home.    Follow-Up: At [facility], you and your health needs are our priority.  As part of our continuing mission to provide you with exceptional heart care, we have created designated Provider Care Teams.  These Care Teams include your primary Cardiologist  (physician) and Advanced Practice Providers (APPs -  Physician Assistants and Nurse Practitioners) who all work together to provide you with the care you need, when you need it.  We recommend signing up for the patient portal called "MyChart".  Sign up information is provided on this After Visit Summary.  MyChart is used to connect with patients for Virtual Visits (Telemedicine).  Patients are able to view lab/test results, encounter notes, upcoming appointments, etc.  Non-urgent messages can be sent to your provider as well.   To learn more about what you can do with MyChart, go to [email or web address].    Your next appointment:   Follow up as needed with Dr. Sallyanne Kuster   Signed, Sanda Klein, MD  07/06/2021 11:16 AM    [place]

## 2021-07-04 NOTE — Patient Instructions (Signed)
Medication Instructions:  No changes *If you need a refill on your cardiac medications before your next appointment, please call your pharmacy*   Lab Work: None ordered If you have labs (blood work) drawn today and your tests are completely normal, you will receive your results only by: [place] (if you have MyChart) OR A paper copy in the mail If you have any lab test that is abnormal or we need to change your treatment, we will call you to review the results.   Testing/Procedures: [employer] Cardiac Event Monitor Instructions Your physician has requested you wear your cardiac event monitor for __30___ days, (1-30). [employer] may call or text to confirm a shipping address. The monitor will be sent to a land address via UPS. [employer] will not ship a monitor to a PO BOX. It typically takes 3-5 days to receive your monitor after it has been enrolled. [employer] will assist with USPS tracking if your package is delayed. The telephone number for [employer] is [phone]. Once you have received your monitor, please review the enclosed instructions. Instruction tutorials can also be viewed under help and settings on the enclosed cell phone. Your monitor has already been registered assigning a specific monitor serial # to you.  Applying the monitor Remove cell phone from case and turn it on. The cell phone works as Dealer and needs to be within [employer] of you at all times. The cell phone will need to be charged on a daily basis. We recommend you plug the cell phone into the enclosed charger at your bedside table every night.  Monitor batteries: You will receive two monitor batteries labelled #1 and #2. These are your recorders. Plug battery #2 onto the second connection on the enclosed charger. Keep one battery on the charger at all times. This will keep the monitor battery deactivated. It will also keep it fully charged for when you need to switch your monitor  batteries. A small light will be blinking on the battery emblem when it is charging. The light on the battery emblem will remain on when the battery is fully charged.  Open package of a Monitor strip. Insert battery #1 into black hood on strip and gently squeeze monitor battery onto connection as indicated in instruction booklet. Set aside while preparing skin.  Choose location for your strip, vertical or horizontal, as indicated in the instruction booklet. Shave to remove all hair from location. There cannot be any lotions, oils, powders, or colognes on skin where monitor is to be applied. Wipe skin clean with enclosed Saline wipe. Dry skin completely.  Peel paper labeled #1 off the back of the Monitor strip exposing the adhesive. Place the monitor on the chest in the vertical or horizontal position shown in the instruction booklet. One arrow on the monitor strip must be pointing upward. Carefully remove paper labeled #2, attaching remainder of strip to your skin. Try not to create any folds or wrinkles in the strip as you apply it.  Firmly press and release the circle in the center of the monitor battery. You will hear a small beep. This is turning the monitor battery on. The heart emblem on the monitor battery will light up every 5 seconds if the monitor battery in turned on and connected to the patient securely. Do not push and hold the circle down as this turns the monitor battery off. The cell phone will locate the monitor battery. A screen will appear on the cell phone checking the connection of  your monitor strip. This may read poor connection initially but change to good connection within the next minute. Once your monitor accepts the connection you will hear a series of 3 beeps followed by a climbing crescendo of beeps. A screen will appear on the cell phone showing the two monitor strip placement options. Touch the picture that demonstrates where you applied the monitor  strip.  Your monitor strip and battery are waterproof. You are able to shower, bathe, or swim with the monitor on. They just ask you do not submerge deeper than 3 feet underwater. We recommend removing the monitor if you are swimming in a lake, river, or ocean.  Your monitor battery will need to be switched to a fully charged monitor battery approximately once a week. The cell phone will alert you of an action which needs to be made.  On the cell phone, tap for details to reveal connection status, monitor battery status, and cell phone battery status. The green dots indicates your monitor is in good status. A red dot indicates there is something that needs your attention.  To record a symptom, click the circle on the monitor battery. In 30-60 seconds a list of symptoms will appear on the cell phone. Select your symptom and tap save. Your monitor will record a sustained or significant arrhythmia regardless of you clicking the button. Some patients do not feel the heart rhythm irregularities. [employer] will notify us of any serious or critical events.  Refer to instruction booklet for instructions on switching batteries, changing strips, the Do not disturb or Pause features, or any additional questions.  Call [employer] at [phone], to confirm your monitor is transmitting and record your baseline. They will answer any questions you may have regarding the monitor instructions at that time.  Returning the monitor to [place] all equipment back into blue box. Peel off strip of paper to expose adhesive and close box securely. There is a prepaid UPS shipping label on this box. Drop in a UPS drop box, or at a UPS facility like [employer]. You may also contact [employer] to arrange UPS to pick up monitor package at your home.    Follow-Up: At [facility], you and your health needs are our priority.  As part of our continuing mission to provide you with exceptional heart care, we  have created designated Provider Care Teams.  These Care Teams include your primary Cardiologist (physician) and Advanced Practice Providers (APPs -  Physician Assistants and Nurse Practitioners) who all work together to provide you with the care you need, when you need it.  We recommend signing up for the patient portal called "MyChart".  Sign up information is provided on this After Visit Summary.  MyChart is used to connect with patients for Virtual Visits (Telemedicine).  Patients are able to view lab/test results, encounter notes, upcoming appointments, etc.  Non-urgent messages can be sent to your provider as well.   To learn more about what you can do with MyChart, go to [email or web address].    Your next appointment:   Follow up as needed with Dr. Sallyanne Kuster

## 2021-07-04 NOTE — Progress Notes (Signed)
Enrolled patient for a 30 day [employer] Event monitor to be mailed to patients rehab facility  [place], [place] [address]

## 2021-07-16 ENCOUNTER — Ambulatory Visit (INDEPENDENT_AMBULATORY_CARE_PROVIDER_SITE_OTHER): Payer: Medicare Other

## 2021-07-16 DIAGNOSIS — I639 Cerebral infarction, unspecified: Secondary | ICD-10-CM | POA: Diagnosis not present

## 2021-07-16 DIAGNOSIS — I4891 Unspecified atrial fibrillation: Secondary | ICD-10-CM

## 2021-08-18 ENCOUNTER — Other Ambulatory Visit: Payer: Self-pay | Admitting: Cardiovascular Disease

## 2021-08-18 DIAGNOSIS — I4891 Unspecified atrial fibrillation: Secondary | ICD-10-CM

## 2021-08-18 DIAGNOSIS — I639 Cerebral infarction, unspecified: Secondary | ICD-10-CM

## 2021-09-01 DIAGNOSIS — R2681 Unsteadiness on feet: Secondary | ICD-10-CM | POA: Diagnosis not present

## 2021-09-01 DIAGNOSIS — I639 Cerebral infarction, unspecified: Secondary | ICD-10-CM | POA: Diagnosis not present

## 2021-09-08 ENCOUNTER — Telehealth: Payer: Self-pay

## 2021-09-08 NOTE — Telephone Encounter (Signed)
CALLED PATIENT HE IS IN A REHAB CENTER SOMEONE ANSWERED AND PUT ME THROUGH TO A NURSE LEFT VOICEMAIL ON HER MACHINE TO CALL us BACK TO SCHEDULE PATIENT

## 2021-09-09 ENCOUNTER — Telehealth: Payer: Self-pay

## 2021-09-09 NOTE — Telephone Encounter (Signed)
Called got transferred and hung up on again

## 2021-09-29 DIAGNOSIS — W19XXXA Unspecified fall, initial encounter: Secondary | ICD-10-CM | POA: Diagnosis not present

## 2021-09-29 DIAGNOSIS — R252 Cramp and spasm: Secondary | ICD-10-CM | POA: Diagnosis not present

## 2021-09-29 DIAGNOSIS — Y92129 Unspecified place in nursing home as the place of occurrence of the external cause: Secondary | ICD-10-CM | POA: Diagnosis not present

## 2021-09-29 DIAGNOSIS — R262 Difficulty in walking, not elsewhere classified: Secondary | ICD-10-CM | POA: Diagnosis not present

## 2021-09-29 DIAGNOSIS — I639 Cerebral infarction, unspecified: Secondary | ICD-10-CM | POA: Diagnosis not present

## 2021-09-30 DIAGNOSIS — M25571 Pain in right ankle and joints of right foot: Secondary | ICD-10-CM | POA: Diagnosis not present

## 2021-09-30 DIAGNOSIS — I10 Essential (primary) hypertension: Secondary | ICD-10-CM | POA: Diagnosis not present

## 2021-09-30 DIAGNOSIS — I6330 Cerebral infarction due to thrombosis of unspecified cerebral artery: Secondary | ICD-10-CM | POA: Diagnosis not present

## 2021-09-30 DIAGNOSIS — E785 Hyperlipidemia, unspecified: Secondary | ICD-10-CM | POA: Diagnosis not present

## 2021-09-30 DIAGNOSIS — F32A Depression, unspecified: Secondary | ICD-10-CM | POA: Diagnosis not present

## 2021-10-07 DIAGNOSIS — I10 Essential (primary) hypertension: Secondary | ICD-10-CM | POA: Diagnosis not present

## 2021-10-07 DIAGNOSIS — E785 Hyperlipidemia, unspecified: Secondary | ICD-10-CM | POA: Diagnosis not present

## 2021-10-07 DIAGNOSIS — M79671 Pain in right foot: Secondary | ICD-10-CM | POA: Diagnosis not present

## 2021-10-07 DIAGNOSIS — F32A Depression, unspecified: Secondary | ICD-10-CM | POA: Diagnosis not present

## 2021-10-07 DIAGNOSIS — M25571 Pain in right ankle and joints of right foot: Secondary | ICD-10-CM | POA: Diagnosis not present

## 2021-10-08 DIAGNOSIS — E785 Hyperlipidemia, unspecified: Secondary | ICD-10-CM | POA: Diagnosis not present

## 2021-10-08 DIAGNOSIS — S93409A Sprain of unspecified ligament of unspecified ankle, initial encounter: Secondary | ICD-10-CM | POA: Diagnosis not present

## 2021-10-08 DIAGNOSIS — F32A Depression, unspecified: Secondary | ICD-10-CM | POA: Diagnosis not present

## 2021-10-08 DIAGNOSIS — I10 Essential (primary) hypertension: Secondary | ICD-10-CM | POA: Diagnosis not present

## 2021-10-08 DIAGNOSIS — M25571 Pain in right ankle and joints of right foot: Secondary | ICD-10-CM | POA: Diagnosis not present

## 2021-10-13 DIAGNOSIS — M25571 Pain in right ankle and joints of right foot: Secondary | ICD-10-CM | POA: Diagnosis not present

## 2021-10-13 DIAGNOSIS — I639 Cerebral infarction, unspecified: Secondary | ICD-10-CM | POA: Diagnosis not present

## 2021-10-13 DIAGNOSIS — M6281 Muscle weakness (generalized): Secondary | ICD-10-CM | POA: Diagnosis not present

## 2021-10-15 ENCOUNTER — Ambulatory Visit: Payer: Medicare Other | Admitting: Adult Health

## 2021-10-15 DIAGNOSIS — M6281 Muscle weakness (generalized): Secondary | ICD-10-CM | POA: Diagnosis not present

## 2021-10-15 DIAGNOSIS — M25571 Pain in right ankle and joints of right foot: Secondary | ICD-10-CM | POA: Diagnosis not present

## 2021-10-15 DIAGNOSIS — I639 Cerebral infarction, unspecified: Secondary | ICD-10-CM | POA: Diagnosis not present

## 2021-10-15 NOTE — Progress Notes (Unsigned)
[facility] [address] [place]. [place] [address] [phone]       STROKE FOLLOW UP NOTE  [name] Date of Birth:  May 24, 1956 Medical Record Number:  [id number]   Reason for Referral: stroke follow up    SUBJECTIVE:   CHIEF COMPLAINT:  No chief complaint on file.   HPI:   Update 10/15/2021 JM: Patient returns for 74-month stroke follow-up unaccompanied.  Continues to reside at [facility].  Overall doing well without new stroke/TIA symptoms.  Reports residual ***.  Per MAR, remains on aspirin and Crestor -denies side effects.  Blood pressure today ***.  No new concerns at this time.       History provided for reference purposes only Initial visit 06/19/2021 JM: David [name] is being seen for hospital follow-up unaccompanied.  He continues to reside at [facility].  Overall stable.  Reports working with therapies for balance which has been improving and current use of cane (not using prior). Denies any recent falls. Denies cognitive difficulties.  Denies new stroke/TIA symptoms.  Per MAR, currently on aspirin and Crestor 40mg  daily. Blood pressure today 126/88. Monitors at SNF which has been stable per patient.  Denies recent lab work.  No concerns at this time.  Stroke admission David [name] is a 66 y.o. male with history of cocaine use, hypertension, hyperlipidemia, prediabetes, and hx of L BG/CR stroke in 07/2017 with residual mild left-sided weakness who presented on 03/13/2021 s/p fall and altered mental status.  Personally reviewed hospitalization pertinent progress notes, lab work and imaging.  MRI showed left periventricular white matter and left MCA/PCA white matter punctate infarcts secondary to small vessel disease source as well as evidence of old infarcts of bilateral BG/CR and pons.  CTA head/neck left A2 stenosis and possible BA moderate stenosis.  EF 65 to 70%.  LDL 133.  A1c 6.4.  UDS negative but admitted cocaine use 2  months prior.  Recommended DAPT for 3 weeks and aspirin alone as well as initiated atorvastatin 40 mg daily.  Therapy eval's recommended outpatient OT although homeless.   Readmitted 04/05/2021 after a fall witnessed by bystander with confusion and strong smell of urine per EMS. Per note, ED called family and son reports that patient is still homeless.  MRI showed left corona radiata and right frontal white matter infarcts secondary to small vessel disease given risk factors, continue cocaine use and noncompliance with medications.  Also noted encephalopathy likely related to COVID-positive, fever, stroke and substance abuse.  CNS infection ruled out.  Discharged on DAPT but apparently noncompliant.  UDS positive for cocaine.  MRA similar appearance to prior CTA.  LDL 103.  A1c 6.4.  Recommended DAPT for 3 weeks and aspirin alone (as non compliant after recent discharge).  Previously discharged on atorvastatin 80 mg daily although evidence of elevated LFTs and after switching to Crestor 40 mg daily, LFTs drastically improved. Therapy eval recommended SNF for continued impaired cognition and gait impairment.  He was discharged to [facility] on 04/25/2021    PERTINENT IMAGING  CT HEAD  03/13/2021 IMPRESSION: 1. Right frontal scalp laceration and crescentic scalp hematoma. No subjacent calvarial fracture. 2. No acute intracranial abnormality. No acute cervical spine fracture or traumatic listhesis. 3. Straightening and reversal the cervical spine may be related to positioning or muscle spasm. 4. Chronic lacunar type infarcts and extensive microvascular angiopathy and features of prior left wallerian degeneration. 5. Multilevel cervical spondylitic changes, as detailed  above. 6. Lucency and coarsened trabecular bone at the T2 vertebral body, appearance most consistent with a large vertebral body hemangioma. 7. Arthrosis at the atlantodental interval with some calcific pannus formation  posterior to the dens, nonspecific though can be seen as early manifestation of CPPD or rheumatoid arthropathy.  04/05/2021 IMPRESSION: No acute abnormality noted. Chronic ischemic changes are noted.   MR BRAIN WO CONTRAST 03/13/2021 IMPRESSION: 1. Scattered small areas of acute infarct in the left frontal deep white matter and left frontal parietal periventricular white matter. 2. Extensive chronic small vessel ischemic changes. 3. No intracranial hemorrhage. Moderately large right frontal scalp contusion with laceration.  04/06/2021 IMPRESSION: 1. Acute to subacute small vessel infarcts in the left corona radiata and right frontal white matter. 2. Background of advanced chronic small vessel disease.   CTA HEAD/NECK 03/13/2021 IMPRESSION: CTA Head: 1. No large vessel occlusion. 2. Moderate left A2 ACA stenosis, possibly multifocal. 3. Motion limited evaluation of basilar artery with suspected moderate stenosis. 4. Small vertebrobasilar system with bilateral prominent posterior communicating arteries and small P1 PCAs, anatomic variant.   CTA Neck: 1. No significant (greater than 50%) stenosis. 2. Mild-to-moderate bilateral carotid bifurcation atherosclerosis. 3. Emphysema.   MRA HEAD  04/06/2021 IMPRESSION: 1. Moderately motion degraded. 2. No emergent finding or convincing change from a CTA 3 weeks ago.      ROS:   14 system review of systems performed and negative with exception of those listed in HPI  PMH:  Past Medical History:  Diagnosis Date   AKI (acute kidney injury) ([place]) 09/01/2017   Benign prostatic hyperplasia    Cerebral infarction ([place]) 08/04/2017   Late acute/ Early subacute infarction centered left putamen,corona radiata, and caudate body measuring 5.1 cc.   Cerebrovascular accident (CVA) ([place]) 03/13/2021   Lacunar infarction ([place])    MRI 08/04/2017: Chronic lacunar infarcts in left pons andright anterior basal ganglia.   Lipoma of scalp     Head CT 02/2021    PSH:  Past Surgical History:  Procedure Laterality Date   SHOULDER ARTHROSCOPY W/ ROTATOR CUFF REPAIR  10/21/10   At [facility], R shoulder    Social History:  Social History   Socioeconomic History   Marital status: Single    Spouse name: Not on file   Number of children: Not on file   Years of education: Not on file   Highest education level: Not on file  Occupational History   Not on file  Tobacco Use   Smoking status: Never   Smokeless tobacco: Never  Substance and Sexual Activity   Alcohol use: No    Comment: occasion   Drug use: No   Sexual activity: Not on file  Other Topics Concern   Not on file  Social History Narrative   Not on file   Social Determinants of Health   Financial Resource Strain: Not on file  Food Insecurity: Not on file  Transportation Needs: Not on file  Physical Activity: Not on file  Stress: Not on file  Social Connections: Not on file  Intimate Partner Violence: Not on file    Family History: No family history on file.  Medications:   Current Outpatient Medications on File Prior to Visit  Medication Sig Dispense Refill   acetaminophen (TYLENOL) 325 MG tablet Take 650 mg by mouth every 6 (six) hours as needed.     amLODipine (NORVASC) 5 MG tablet Take 1 tablet (5 mg total) by mouth daily. 30 tablet 0   aspirin EC  81 MG EC tablet Take 1 tablet (81 mg total) by mouth daily. Swallow whole. 30 tablet 0   Baclofen 5 MG TABS Take 5 mg by mouth in the morning, at noon, and at bedtime. 90 tablet 5   escitalopram (LEXAPRO) 20 MG tablet Take 1 tablet (20 mg total) by mouth daily. 30 tablet 1   famotidine (PEPCID) 20 MG tablet Take 20 mg by mouth 2 (two) times daily.     rosuvastatin (CRESTOR) 40 MG tablet Take 1 tablet (40 mg total) by mouth daily. 30 tablet 0   tamsulosin (FLOMAX) 0.4 MG CAPS capsule Take 1 capsule (0.4 mg total) by mouth daily. 30 capsule 1   No current facility-administered medications on file prior to visit.     Allergies:  No Known Allergies    OBJECTIVE:  Physical Exam  There were no vitals filed for this visit.  There is no height or weight on file to calculate BMI. No results found.  General: well developed, well nourished, pleasant middle-aged African-American male, seated, in no evident distress Head: head normocephalic and atraumatic.   Neck: supple with no carotid or supraclavicular bruits Cardiovascular: regular rate and rhythm, no murmurs Musculoskeletal: no deformity Skin:  no rash/petichiae Vascular:  Normal pulses all extremities   Neurologic Exam Mental Status: Awake and fully alert.  Mild dysarthria (although per pt, baseline).  No evidence of aphasia.  Oriented to place and time. Recent mildly impaired and remote memory intact. Attention span, concentration and fund of knowledge slightly impaired. Mood appropriate although flat affect Cranial Nerves: Pupils equal, briskly reactive to light. Extraocular movements full without nystagmus. Visual fields full to confrontation. Hearing intact. Facial sensation intact. Face, tongue, palate moves normally and symmetrically.  Motor: Normal bulk and tone. Normal strength in all tested extremity muscles except increased tone RUE (with limited ROM) and RLE (difficulty fully assessing hip flexor d/t pain) Sensory.: intact to touch , pinprick , position and vibratory sensation.  Coordination: Rapid alternating movements normal in all extremities. Finger-to-nose and heel-to-shin performed accurately bilaterally. Gait and Station: Arises from chair without difficulty. Stance is normal. Gait demonstrates decreased stride length and step height with mild unsteadiness and use of cane.  Tandem walk and heel toe not attempted.  Reflexes: 1+ and symmetric. Toes downgoing.         ASSESSMENT: David [name] is a 67 y.o. year old male left periventricular white matter and left MCA/PCA white matter punctate infarcts on 03/13/2021 and left  corona radiata and right frontal white matter infarcts on 04/05/2021 likely secondary to small vessel disease given risk factors, continued cocaine use and medication noncompliance. Vascular risk factors include history of prior strokes, HTN, HLD, DM, cocaine use, EtOH use, COVID-positive 04/05/2021.      PLAN:  Recurrent strokes:  Residual deficit: Right-sided spasticity, gait impairment and likely cognitive impairment. Start baclofen 5 mg 3 times daily for spasticity.  Discussed potential side effects and requested SNF monitor for potential side effects or possible need of dosage increase.  Continue working with therapies at [facility].  Continue use of cane at all times unless otherwise instructed Continue aspirin 81 mg daily  and Crestor 40 mg daily for secondary stroke prevention.   Discussed secondary stroke prevention measures and importance of close PCP follow up for aggressive stroke risk factor management. I have gone over the pathophysiology of stroke, warning signs and symptoms, risk factors and their management in some detail with instructions to go to the closest emergency room for  symptoms of concern. HTN: BP goal <130/90.  Stable on amlodipine per facility HLD: LDL goal <70.  Prior LDL 78 down from 103.  Continue Crestor 40 mg daily.  Request ongoing monitoring and management by SNF/PCP Elevated A1c: A1c goal<7.0.  Prior A1c 6.5 up from 6.4.  Request routine monitoring by SNF/PCP Elevated LFTs: Resolved Polysubstance abuse: Currently not using as in SNF.  Discussed importance of refraining if/when he returns home as continued use greatly increases risk of recurrent strokes    Follow up in 4-5 months or call earlier if needed   CC:  PCP: Kinnie Feil, MD    I spent 54 minutes of face-to-face and non-face-to-face time with patient.  This included previsit chart review, lab review, study review, order entry, electronic health record documentation, patient education regarding prior  stroke including etiology, secondary stroke prevention measures and importance of managing stroke risk factors, residual deficits and typical recovery time and answered all other questions to patient satisfaction  Frann Rider, AGNP-BC  [facility] [address] [place],  [address]  Phone [phone] Fax [phone] Note: This document was prepared with digital dictation and possible smart phrase technology. Any transcriptional errors that result from this process are unintentional.

## 2021-10-16 DIAGNOSIS — I639 Cerebral infarction, unspecified: Secondary | ICD-10-CM | POA: Diagnosis not present

## 2021-10-16 DIAGNOSIS — M25571 Pain in right ankle and joints of right foot: Secondary | ICD-10-CM | POA: Diagnosis not present

## 2021-10-16 DIAGNOSIS — M6281 Muscle weakness (generalized): Secondary | ICD-10-CM | POA: Diagnosis not present

## 2021-10-17 DIAGNOSIS — I639 Cerebral infarction, unspecified: Secondary | ICD-10-CM | POA: Diagnosis not present

## 2021-10-17 DIAGNOSIS — M25571 Pain in right ankle and joints of right foot: Secondary | ICD-10-CM | POA: Diagnosis not present

## 2021-10-17 DIAGNOSIS — M6281 Muscle weakness (generalized): Secondary | ICD-10-CM | POA: Diagnosis not present

## 2021-10-18 DIAGNOSIS — M6281 Muscle weakness (generalized): Secondary | ICD-10-CM | POA: Diagnosis not present

## 2021-10-18 DIAGNOSIS — I639 Cerebral infarction, unspecified: Secondary | ICD-10-CM | POA: Diagnosis not present

## 2021-10-18 DIAGNOSIS — M25571 Pain in right ankle and joints of right foot: Secondary | ICD-10-CM | POA: Diagnosis not present

## 2021-10-20 DIAGNOSIS — M25571 Pain in right ankle and joints of right foot: Secondary | ICD-10-CM | POA: Diagnosis not present

## 2021-10-20 DIAGNOSIS — M6281 Muscle weakness (generalized): Secondary | ICD-10-CM | POA: Diagnosis not present

## 2021-10-20 DIAGNOSIS — I639 Cerebral infarction, unspecified: Secondary | ICD-10-CM | POA: Diagnosis not present

## 2021-10-21 DIAGNOSIS — M6281 Muscle weakness (generalized): Secondary | ICD-10-CM | POA: Diagnosis not present

## 2021-10-21 DIAGNOSIS — M25571 Pain in right ankle and joints of right foot: Secondary | ICD-10-CM | POA: Diagnosis not present

## 2021-10-21 DIAGNOSIS — I639 Cerebral infarction, unspecified: Secondary | ICD-10-CM | POA: Diagnosis not present

## 2021-10-22 DIAGNOSIS — M6281 Muscle weakness (generalized): Secondary | ICD-10-CM | POA: Diagnosis not present

## 2021-10-22 DIAGNOSIS — M25571 Pain in right ankle and joints of right foot: Secondary | ICD-10-CM | POA: Diagnosis not present

## 2021-10-22 DIAGNOSIS — I639 Cerebral infarction, unspecified: Secondary | ICD-10-CM | POA: Diagnosis not present

## 2021-10-23 ENCOUNTER — Ambulatory Visit: Payer: Medicare Other | Admitting: Adult Health

## 2021-10-23 DIAGNOSIS — I639 Cerebral infarction, unspecified: Secondary | ICD-10-CM | POA: Diagnosis not present

## 2021-10-23 DIAGNOSIS — M6281 Muscle weakness (generalized): Secondary | ICD-10-CM | POA: Diagnosis not present

## 2021-10-23 DIAGNOSIS — M25571 Pain in right ankle and joints of right foot: Secondary | ICD-10-CM | POA: Diagnosis not present

## 2021-10-24 DIAGNOSIS — I639 Cerebral infarction, unspecified: Secondary | ICD-10-CM | POA: Diagnosis not present

## 2021-10-24 DIAGNOSIS — M25571 Pain in right ankle and joints of right foot: Secondary | ICD-10-CM | POA: Diagnosis not present

## 2021-10-24 DIAGNOSIS — M6281 Muscle weakness (generalized): Secondary | ICD-10-CM | POA: Diagnosis not present

## 2021-10-28 DIAGNOSIS — I639 Cerebral infarction, unspecified: Secondary | ICD-10-CM | POA: Diagnosis not present

## 2021-10-28 DIAGNOSIS — M25571 Pain in right ankle and joints of right foot: Secondary | ICD-10-CM | POA: Diagnosis not present

## 2021-10-28 DIAGNOSIS — I10 Essential (primary) hypertension: Secondary | ICD-10-CM | POA: Diagnosis not present

## 2021-10-28 DIAGNOSIS — E785 Hyperlipidemia, unspecified: Secondary | ICD-10-CM | POA: Diagnosis not present

## 2021-10-28 DIAGNOSIS — F32A Depression, unspecified: Secondary | ICD-10-CM | POA: Diagnosis not present

## 2021-10-28 DIAGNOSIS — I739 Peripheral vascular disease, unspecified: Secondary | ICD-10-CM | POA: Diagnosis not present

## 2021-10-28 DIAGNOSIS — M62838 Other muscle spasm: Secondary | ICD-10-CM | POA: Diagnosis not present

## 2021-10-28 DIAGNOSIS — B351 Tinea unguium: Secondary | ICD-10-CM | POA: Diagnosis not present

## 2021-10-28 DIAGNOSIS — M6281 Muscle weakness (generalized): Secondary | ICD-10-CM | POA: Diagnosis not present

## 2021-10-29 DIAGNOSIS — I639 Cerebral infarction, unspecified: Secondary | ICD-10-CM | POA: Diagnosis not present

## 2021-10-29 DIAGNOSIS — M25571 Pain in right ankle and joints of right foot: Secondary | ICD-10-CM | POA: Diagnosis not present

## 2021-10-29 DIAGNOSIS — M6281 Muscle weakness (generalized): Secondary | ICD-10-CM | POA: Diagnosis not present

## 2021-10-30 DIAGNOSIS — M25571 Pain in right ankle and joints of right foot: Secondary | ICD-10-CM | POA: Diagnosis not present

## 2021-10-30 DIAGNOSIS — I639 Cerebral infarction, unspecified: Secondary | ICD-10-CM | POA: Diagnosis not present

## 2021-10-30 DIAGNOSIS — M6281 Muscle weakness (generalized): Secondary | ICD-10-CM | POA: Diagnosis not present

## 2021-10-31 DIAGNOSIS — M6281 Muscle weakness (generalized): Secondary | ICD-10-CM | POA: Diagnosis not present

## 2021-10-31 DIAGNOSIS — I639 Cerebral infarction, unspecified: Secondary | ICD-10-CM | POA: Diagnosis not present

## 2021-10-31 DIAGNOSIS — M25571 Pain in right ankle and joints of right foot: Secondary | ICD-10-CM | POA: Diagnosis not present

## 2021-11-01 DIAGNOSIS — M6281 Muscle weakness (generalized): Secondary | ICD-10-CM | POA: Diagnosis not present

## 2021-11-01 DIAGNOSIS — I639 Cerebral infarction, unspecified: Secondary | ICD-10-CM | POA: Diagnosis not present

## 2021-11-01 DIAGNOSIS — M25571 Pain in right ankle and joints of right foot: Secondary | ICD-10-CM | POA: Diagnosis not present

## 2021-11-03 DIAGNOSIS — M6281 Muscle weakness (generalized): Secondary | ICD-10-CM | POA: Diagnosis not present

## 2021-11-03 DIAGNOSIS — I639 Cerebral infarction, unspecified: Secondary | ICD-10-CM | POA: Diagnosis not present

## 2021-11-03 DIAGNOSIS — M25571 Pain in right ankle and joints of right foot: Secondary | ICD-10-CM | POA: Diagnosis not present

## 2021-11-05 DIAGNOSIS — M6281 Muscle weakness (generalized): Secondary | ICD-10-CM | POA: Diagnosis not present

## 2021-11-05 DIAGNOSIS — M25571 Pain in right ankle and joints of right foot: Secondary | ICD-10-CM | POA: Diagnosis not present

## 2021-11-05 DIAGNOSIS — I639 Cerebral infarction, unspecified: Secondary | ICD-10-CM | POA: Diagnosis not present

## 2021-11-06 DIAGNOSIS — M6281 Muscle weakness (generalized): Secondary | ICD-10-CM | POA: Diagnosis not present

## 2021-11-06 DIAGNOSIS — M25571 Pain in right ankle and joints of right foot: Secondary | ICD-10-CM | POA: Diagnosis not present

## 2021-11-06 DIAGNOSIS — I639 Cerebral infarction, unspecified: Secondary | ICD-10-CM | POA: Diagnosis not present

## 2021-11-07 DIAGNOSIS — M6281 Muscle weakness (generalized): Secondary | ICD-10-CM | POA: Diagnosis not present

## 2021-11-07 DIAGNOSIS — I639 Cerebral infarction, unspecified: Secondary | ICD-10-CM | POA: Diagnosis not present

## 2021-11-07 DIAGNOSIS — M25571 Pain in right ankle and joints of right foot: Secondary | ICD-10-CM | POA: Diagnosis not present

## 2021-11-10 DIAGNOSIS — M25571 Pain in right ankle and joints of right foot: Secondary | ICD-10-CM | POA: Diagnosis not present

## 2021-11-10 DIAGNOSIS — I639 Cerebral infarction, unspecified: Secondary | ICD-10-CM | POA: Diagnosis not present

## 2021-11-10 DIAGNOSIS — M6281 Muscle weakness (generalized): Secondary | ICD-10-CM | POA: Diagnosis not present

## 2021-11-11 DIAGNOSIS — I639 Cerebral infarction, unspecified: Secondary | ICD-10-CM | POA: Diagnosis not present

## 2021-11-11 DIAGNOSIS — M25571 Pain in right ankle and joints of right foot: Secondary | ICD-10-CM | POA: Diagnosis not present

## 2021-11-11 DIAGNOSIS — M6281 Muscle weakness (generalized): Secondary | ICD-10-CM | POA: Diagnosis not present

## 2021-12-04 ENCOUNTER — Ambulatory Visit (INDEPENDENT_AMBULATORY_CARE_PROVIDER_SITE_OTHER): Payer: Medicare Other | Admitting: Adult Health

## 2021-12-04 ENCOUNTER — Encounter: Payer: Self-pay | Admitting: Adult Health

## 2021-12-04 VITALS — BP 135/83 | HR 58 | Ht 70.0 in | Wt 173.0 lb

## 2021-12-04 DIAGNOSIS — I10 Essential (primary) hypertension: Secondary | ICD-10-CM

## 2021-12-04 DIAGNOSIS — E785 Hyperlipidemia, unspecified: Secondary | ICD-10-CM | POA: Diagnosis not present

## 2021-12-04 DIAGNOSIS — R7989 Other specified abnormal findings of blood chemistry: Secondary | ICD-10-CM | POA: Diagnosis not present

## 2021-12-04 DIAGNOSIS — R7309 Other abnormal glucose: Secondary | ICD-10-CM

## 2021-12-04 DIAGNOSIS — I639 Cerebral infarction, unspecified: Secondary | ICD-10-CM

## 2021-12-04 NOTE — Patient Instructions (Addendum)
Overall stable from stroke standpoint, no changes in treatment plan today ? ?Spasticity okay today although some increased tone noted in right shoulder - would recommend use of baclofen PRN - request ongoing management by SNF ? ?We will check CBC, CMP, lipids and A1c today - please ensure this is routinely monitored at SNF ? ?Continue aspirin 81 mg daily  and Crestor for secondary stroke prevention ? ?Continue to follow up with PCP/SNF regarding cholesterol, blood pressure and pre-DM management  ?Maintain strict control of hypertension with blood pressure goal below 130/90, pre-diabetes with hemoglobin A1c goal below 7.0 % and cholesterol with LDL cholesterol (bad cholesterol) goal below 70 mg/dL.  ? ?Signs of a Stroke? Follow the BEFAST method:  ?Balance Watch for a sudden loss of balance, trouble with coordination or vertigo ?Eyes Is there a sudden loss of vision in one or both eyes? Or double vision?  ?Face: Ask the person to smile. Does one side of the face droop or is it numb?  ?Arms: Ask the person to raise both arms. Does one arm drift downward? Is there weakness or numbness of a leg? ?Speech: Ask the person to repeat a simple phrase. Does the speech sound slurred/strange? Is the person confused ? ?Time: If you observe any of these signs, call 911. ? ? ? ? ?Overall stable from stroke standpoint and recommend follow-up on an as-needed basis for stroke related concerns.  If any new neurological issues should arise, please submit official referral for further evaluation. ? ? ? ? ?Thank you for coming to see [place] at [facility]. I hope we have been able to provide you high quality care today. ? ?You may receive a patient satisfaction survey over the next few weeks. We would appreciate your feedback and comments so that we may continue to improve ourselves and the health of our patients. ? ?

## 2021-12-04 NOTE — Progress Notes (Signed)
?[facility] ?[address] ?[place]. [place] [address] ?[phone] ? ?     STROKE FOLLOW UP NOTE ? ?[name] ?Date of Birth:  1956-01-22 ?Medical Record Number:  [id number]  ? ?Reason for Referral: stroke follow up ? ? ? ?SUBJECTIVE: ? ? ?CHIEF COMPLAINT:  ?Chief Complaint  ?Patient presents with  ? Follow-up  ?  RM 2 Alone  ?Pt is well and stable, no new concerns   ? ? ?HPI:  ? ?Update 12/04/2021 JM: Patient returns for 39-monthstroke follow-up unaccompanied.  [facility]  Reports residual mild imbalance, no longer using cane, denies any recent falls. Completed therapies. Chronic right sided deficits stable.  Previously reported pain on right side but denies this still being an issue.  He does have baclofen '5mg'$  TID on his list but does not believe he has been receiving this.  Denies new stroke/TIA symptoms.  Per SNF MAR, remains on aspirin and Crestor, denies side effects.  Blood pressure today 135/83.  Seen by cardiology 07/2021, completed cardiac monitor which was negative for A-fib.  Denies any recent lab work through SNF since our prior visit.  No new concerns at this time. ? ? ?History provided for reference purposes only ?Initial visit 06/19/2021 JM: [name] being seen for hospital follow-up unaccompanied.  [facility]  Overall stable.  Reports working with therapies for balance which has been improving and current use of cane (not using prior). Denies any recent falls. Denies cognitive difficulties.  Denies new stroke/TIA symptoms.  Per MAR, currently on aspirin and Crestor '40mg'$  daily. Blood pressure today 126/88. Monitors at SNF which has been stable per patient.  Denies recent lab work.  No concerns at this time. ? ?Stroke admission 03/13/2021 ?Mr. David ZEIDMANis a 66y.o. male with history of cocaine use, hypertension, hyperlipidemia, prediabetes, and hx of L BG/CR stroke in 07/2017 with residual mild  left-sided weakness who presented on 03/13/2021 s/p fall and altered mental status.  Personally reviewed hospitalization pertinent progress notes, lab work and imaging.  MRI showed left periventricular white matter and left MCA/PCA white matter punctate infarcts secondary to small vessel disease source as well as evidence of old infarcts of bilateral BG/CR and pons.  CTA head/neck left A2 stenosis and possible BA moderate stenosis.  EF 65 to 70%.  LDL 133.  A1c 6.4.  UDS negative but admitted cocaine use 2 months prior.  Recommended DAPT for 3 weeks and aspirin alone as well as initiated atorvastatin 40 mg daily.  Therapy eval's recommended outpatient OT although homeless.  ? ?Readmitted 04/05/2021 after a fall witnessed by bystander with confusion and strong smell of urine per EMS. Per note, ED called family and son reports that patient is still homeless.  MRI showed left corona radiata and right frontal white matter infarcts secondary to small vessel disease given risk factors, continue cocaine use and noncompliance with medications.  Also noted encephalopathy likely related to COVID-positive, fever, stroke and substance abuse.  CNS infection ruled out.  Discharged on DAPT but apparently noncompliant.  UDS positive for cocaine.  MRA similar appearance to prior CTA.  LDL 103.  A1c 6.4.  Recommended DAPT for 3 weeks and aspirin alone (as non compliant after recent discharge).  Previously discharged on atorvastatin 80 mg daily although evidence of elevated LFTs and after switching to Crestor 40 mg daily, LFTs drastically improved. Therapy eval recommended SNF for continued  impaired cognition and gait impairment.  He was discharged to [facility] on 04/25/2021 ? ? ? ?PERTINENT IMAGING ? ?CT HEAD  ?03/13/2021 ?IMPRESSION: ?1. Right frontal scalp laceration and crescentic scalp hematoma. No ?subjacent calvarial fracture. ?2. No acute intracranial abnormality. No acute cervical spine ?fracture or traumatic  listhesis. ?3. Straightening and reversal the cervical spine may be related to ?positioning or muscle spasm. ?4. Chronic lacunar type infarcts and extensive microvascular ?angiopathy and features of prior left wallerian degeneration. ?5. Multilevel cervical spondylitic changes, as detailed above. ?6. Lucency and coarsened trabecular bone at the T2 vertebral body, ?appearance most consistent with a large vertebral body hemangioma. ?7. Arthrosis at the atlantodental interval with some calcific pannus ?formation posterior to the dens, nonspecific though can be seen as ?early manifestation of CPPD or rheumatoid arthropathy. ? ?04/05/2021 ?IMPRESSION: ?No acute abnormality noted. ?Chronic ischemic changes are noted. ? ? ?MR BRAIN WO CONTRAST ?03/13/2021 ?IMPRESSION: ?1. Scattered small areas of acute infarct in the left frontal deep ?white matter and left frontal parietal periventricular white matter. ?2. Extensive chronic small vessel ischemic changes. ?3. No intracranial hemorrhage. Moderately large right frontal scalp ?contusion with laceration. ? ?04/06/2021 ?IMPRESSION: ?1. Acute to subacute small vessel infarcts in the left corona ?radiata and right frontal white matter. ?2. Background of advanced chronic small vessel disease. ? ? ?CTA HEAD/NECK 03/13/2021 ?IMPRESSION: ?CTA Head: ?1. No large vessel occlusion. ?2. Moderate left A2 ACA stenosis, possibly multifocal. ?3. Motion limited evaluation of basilar artery with suspected ?moderate stenosis. ?4. Small vertebrobasilar system with bilateral prominent posterior ?communicating arteries and small P1 PCAs, anatomic variant. ?  ?CTA Neck: ?1. No significant (greater than 50%) stenosis. ?2. Mild-to-moderate bilateral carotid bifurcation atherosclerosis. ?3. Emphysema. ? ? ?MRA HEAD  ?04/06/2021 ?IMPRESSION: ?1. Moderately motion degraded. ?2. No emergent finding or convincing change from a CTA 3 weeks ago. ? ? ? ? ? ?ROS:   ?14 system review of systems performed and negative  with exception of those listed in HPI ? ?PMH:  ?Past Medical History:  ?Diagnosis Date  ? AKI (acute kidney injury) ([place]) 09/01/2017  ? Benign prostatic hyperplasia   ? Cerebral infarction ([place]) 08/04/2017  ? Late acute/ Early subacute infarction centered left putamen,corona radiata, and caudate body measuring 5.1 cc.  ? Cerebrovascular accident (CVA) ([place]) 03/13/2021  ? Lacunar infarction [facility])   ? MRI 08/04/2017: Chronic lacunar infarcts in left pons andright anterior basal ganglia.  ? Lipoma of scalp   ? Head CT 02/2021  ? ? ?PSH:  ?Past Surgical History:  ?Procedure Laterality Date  ? SHOULDER ARTHROSCOPY W/ ROTATOR CUFF REPAIR  10/21/10  ? At [facility], R shoulder  ? ? ?Social History:  ?Social History  ? ?Socioeconomic History  ? Marital status: Single  ?  Spouse name: Not on file  ? Number of children: Not on file  ? Years of education: Not on file  ? Highest education level: Not on file  ?Occupational History  ? Not on file  ?Tobacco Use  ? Smoking status: Never  ? Smokeless tobacco: Never  ?Substance and Sexual Activity  ? Alcohol use: No  ?  Comment: occasion  ? Drug use: No  ? Sexual activity: Not on file  ?Other Topics Concern  ? Not on file  ?Social History Narrative  ? Not on file  ? ?Social Determinants of Health  ? ?Financial Resource Strain: Not on file  ?Food Insecurity: Not on file  ?Transportation Needs: Not on file  ?Physical Activity:  Not on file  ?Stress: Not on file  ?Social Connections: Not on file  ?Intimate Partner Violence: Not on file  ? ? ?Family History: History reviewed. No pertinent family history. ? ?Medications:   ?Current Outpatient Medications on File Prior to Visit  ?Medication Sig Dispense Refill  ? amLODipine (NORVASC) 5 MG tablet Take 1 tablet (5 mg total) by mouth daily. 30 tablet 0  ? aspirin EC 81 MG EC tablet Take 1 tablet (81 mg total) by mouth daily. Swallow whole. 30 tablet 0  ? Baclofen 5 MG TABS Take 5 mg by mouth in the morning, at noon, and at bedtime. 90 tablet 5  ?  escitalopram (LEXAPRO) 20 MG tablet Take 1 tablet (20 mg total) by mouth daily. 30 tablet 1  ? rosuvastatin (CRESTOR) 40 MG tablet Take 1 tablet (40 mg total) by mouth daily. 30 tablet 0  ? tamsulosin (FLOMAX) 0.4 M

## 2021-12-05 LAB — CBC
Hematocrit: 40.3 % (ref 37.5–51.0)
Hemoglobin: 13 g/dL (ref 13.0–17.7)
MCH: 27.4 pg (ref 26.6–33.0)
MCHC: 32.3 g/dL (ref 31.5–35.7)
MCV: 85 fL (ref 79–97)
Platelets: 235 10*3/uL (ref 150–450)
RBC: 4.75 x10E6/uL (ref 4.14–5.80)
RDW: 14 % (ref 11.6–15.4)
WBC: 4.3 10*3/uL (ref 3.4–10.8)

## 2021-12-05 LAB — COMPREHENSIVE METABOLIC PANEL
ALT: 17 IU/L (ref 0–44)
AST: 28 IU/L (ref 0–40)
Albumin/Globulin Ratio: 1.3 (ref 1.2–2.2)
Albumin: 4.7 g/dL (ref 3.8–4.8)
Alkaline Phosphatase: 126 IU/L — ABNORMAL HIGH (ref 44–121)
BUN/Creatinine Ratio: 12 (ref 10–24)
BUN: 14 mg/dL (ref 8–27)
Bilirubin Total: 0.3 mg/dL (ref 0.0–1.2)
CO2: 23 mmol/L (ref 20–29)
Calcium: 9.1 mg/dL (ref 8.6–10.2)
Chloride: 104 mmol/L (ref 96–106)
Creatinine, Ser: 1.2 mg/dL (ref 0.76–1.27)
Globulin, Total: 3.6 g/dL (ref 1.5–4.5)
Glucose: 94 mg/dL (ref 70–99)
Potassium: 4.1 mmol/L (ref 3.5–5.2)
Sodium: 143 mmol/L (ref 134–144)
Total Protein: 8.3 g/dL (ref 6.0–8.5)
eGFR: 66 mL/min/{1.73_m2} (ref >59)

## 2021-12-05 LAB — HEMOGLOBIN A1C
Est. average glucose Bld gHb Est-mCnc: 140 mg/dL
Hgb A1c MFr Bld: 6.5 % — ABNORMAL HIGH (ref 4.8–5.6)

## 2021-12-05 LAB — LIPID PANEL
Chol/HDL Ratio: 2.6 ratio (ref 0.0–5.0)
Cholesterol, Total: 121 mg/dL (ref 100–199)
HDL: 47 mg/dL (ref >39)
LDL Chol Calc (NIH): 57 mg/dL (ref 0–99)
Triglycerides: 87 mg/dL (ref 0–149)
VLDL Cholesterol Cal: 17 mg/dL (ref 5–40)

## 2021-12-08 ENCOUNTER — Telehealth: Payer: Self-pay

## 2021-12-08 NOTE — Telephone Encounter (Signed)
Contacted facility regarding results. Results faxed to [facility] as rq.  ?

## 2021-12-08 NOTE — Telephone Encounter (Signed)
-----   Message from Frann Rider, NP sent at 12/08/2021  1:09 PM EDT ----- ?Please contact facility regarding recent lab work.  His A1c increased to 6.5 from 6.4, they may need to consider further management as current level is within diabetic range.  His cholesterol levels are stable with LDL or bad cholesterol at 57 with goal less than 70, CMP and CBC stable. If unable to contact, please fax to SNF. Thank you.  ?

## 2021-12-16 DIAGNOSIS — E119 Type 2 diabetes mellitus without complications: Secondary | ICD-10-CM | POA: Diagnosis not present

## 2021-12-23 DIAGNOSIS — F32A Depression, unspecified: Secondary | ICD-10-CM | POA: Diagnosis not present

## 2021-12-23 DIAGNOSIS — E785 Hyperlipidemia, unspecified: Secondary | ICD-10-CM | POA: Diagnosis not present

## 2021-12-23 DIAGNOSIS — I10 Essential (primary) hypertension: Secondary | ICD-10-CM | POA: Diagnosis not present

## 2022-01-16 DIAGNOSIS — K5900 Constipation, unspecified: Secondary | ICD-10-CM | POA: Diagnosis not present

## 2022-01-21 DIAGNOSIS — E785 Hyperlipidemia, unspecified: Secondary | ICD-10-CM | POA: Diagnosis not present

## 2022-01-21 DIAGNOSIS — W19XXXA Unspecified fall, initial encounter: Secondary | ICD-10-CM | POA: Diagnosis not present

## 2022-01-21 DIAGNOSIS — I10 Essential (primary) hypertension: Secondary | ICD-10-CM | POA: Diagnosis not present

## 2022-01-22 DIAGNOSIS — Z23 Encounter for immunization: Secondary | ICD-10-CM | POA: Diagnosis not present

## 2022-02-02 DIAGNOSIS — H524 Presbyopia: Secondary | ICD-10-CM | POA: Diagnosis not present

## 2022-02-02 DIAGNOSIS — H2511 Age-related nuclear cataract, right eye: Secondary | ICD-10-CM | POA: Diagnosis not present

## 2022-02-02 DIAGNOSIS — E119 Type 2 diabetes mellitus without complications: Secondary | ICD-10-CM | POA: Diagnosis not present

## 2022-02-04 DIAGNOSIS — I639 Cerebral infarction, unspecified: Secondary | ICD-10-CM | POA: Diagnosis not present

## 2022-02-04 DIAGNOSIS — M6281 Muscle weakness (generalized): Secondary | ICD-10-CM | POA: Diagnosis not present

## 2022-02-05 DIAGNOSIS — I639 Cerebral infarction, unspecified: Secondary | ICD-10-CM | POA: Diagnosis not present

## 2022-02-05 DIAGNOSIS — M6281 Muscle weakness (generalized): Secondary | ICD-10-CM | POA: Diagnosis not present

## 2022-02-06 DIAGNOSIS — M6281 Muscle weakness (generalized): Secondary | ICD-10-CM | POA: Diagnosis not present

## 2022-02-06 DIAGNOSIS — I639 Cerebral infarction, unspecified: Secondary | ICD-10-CM | POA: Diagnosis not present

## 2022-02-07 DIAGNOSIS — I639 Cerebral infarction, unspecified: Secondary | ICD-10-CM | POA: Diagnosis not present

## 2022-02-07 DIAGNOSIS — M6281 Muscle weakness (generalized): Secondary | ICD-10-CM | POA: Diagnosis not present

## 2022-02-08 DIAGNOSIS — M6281 Muscle weakness (generalized): Secondary | ICD-10-CM | POA: Diagnosis not present

## 2022-02-08 DIAGNOSIS — I639 Cerebral infarction, unspecified: Secondary | ICD-10-CM | POA: Diagnosis not present

## 2022-02-10 DIAGNOSIS — M6281 Muscle weakness (generalized): Secondary | ICD-10-CM | POA: Diagnosis not present

## 2022-02-10 DIAGNOSIS — I639 Cerebral infarction, unspecified: Secondary | ICD-10-CM | POA: Diagnosis not present

## 2022-02-12 DIAGNOSIS — M6281 Muscle weakness (generalized): Secondary | ICD-10-CM | POA: Diagnosis not present

## 2022-02-12 DIAGNOSIS — I639 Cerebral infarction, unspecified: Secondary | ICD-10-CM | POA: Diagnosis not present

## 2022-02-13 DIAGNOSIS — I639 Cerebral infarction, unspecified: Secondary | ICD-10-CM | POA: Diagnosis not present

## 2022-02-13 DIAGNOSIS — M6281 Muscle weakness (generalized): Secondary | ICD-10-CM | POA: Diagnosis not present

## 2022-02-17 DIAGNOSIS — I639 Cerebral infarction, unspecified: Secondary | ICD-10-CM | POA: Diagnosis not present

## 2022-02-17 DIAGNOSIS — M6281 Muscle weakness (generalized): Secondary | ICD-10-CM | POA: Diagnosis not present

## 2022-02-18 DIAGNOSIS — I639 Cerebral infarction, unspecified: Secondary | ICD-10-CM | POA: Diagnosis not present

## 2022-02-18 DIAGNOSIS — M6281 Muscle weakness (generalized): Secondary | ICD-10-CM | POA: Diagnosis not present

## 2022-02-20 DIAGNOSIS — I639 Cerebral infarction, unspecified: Secondary | ICD-10-CM | POA: Diagnosis not present

## 2022-02-20 DIAGNOSIS — M6281 Muscle weakness (generalized): Secondary | ICD-10-CM | POA: Diagnosis not present

## 2022-02-24 DIAGNOSIS — M6281 Muscle weakness (generalized): Secondary | ICD-10-CM | POA: Diagnosis not present

## 2022-02-24 DIAGNOSIS — I639 Cerebral infarction, unspecified: Secondary | ICD-10-CM | POA: Diagnosis not present

## 2022-02-25 DIAGNOSIS — M6281 Muscle weakness (generalized): Secondary | ICD-10-CM | POA: Diagnosis not present

## 2022-02-25 DIAGNOSIS — I639 Cerebral infarction, unspecified: Secondary | ICD-10-CM | POA: Diagnosis not present

## 2022-02-26 DIAGNOSIS — M6281 Muscle weakness (generalized): Secondary | ICD-10-CM | POA: Diagnosis not present

## 2022-02-26 DIAGNOSIS — I639 Cerebral infarction, unspecified: Secondary | ICD-10-CM | POA: Diagnosis not present

## 2022-02-27 DIAGNOSIS — I639 Cerebral infarction, unspecified: Secondary | ICD-10-CM | POA: Diagnosis not present

## 2022-02-27 DIAGNOSIS — M6281 Muscle weakness (generalized): Secondary | ICD-10-CM | POA: Diagnosis not present

## 2022-03-02 DIAGNOSIS — I639 Cerebral infarction, unspecified: Secondary | ICD-10-CM | POA: Diagnosis not present

## 2022-03-02 DIAGNOSIS — M6281 Muscle weakness (generalized): Secondary | ICD-10-CM | POA: Diagnosis not present

## 2022-03-03 DIAGNOSIS — I639 Cerebral infarction, unspecified: Secondary | ICD-10-CM | POA: Diagnosis not present

## 2022-03-03 DIAGNOSIS — M6281 Muscle weakness (generalized): Secondary | ICD-10-CM | POA: Diagnosis not present

## 2022-03-04 DIAGNOSIS — M6281 Muscle weakness (generalized): Secondary | ICD-10-CM | POA: Diagnosis not present

## 2022-03-04 DIAGNOSIS — I639 Cerebral infarction, unspecified: Secondary | ICD-10-CM | POA: Diagnosis not present

## 2022-03-05 DIAGNOSIS — I639 Cerebral infarction, unspecified: Secondary | ICD-10-CM | POA: Diagnosis not present

## 2022-03-05 DIAGNOSIS — K5900 Constipation, unspecified: Secondary | ICD-10-CM | POA: Diagnosis not present

## 2022-03-05 DIAGNOSIS — M6281 Muscle weakness (generalized): Secondary | ICD-10-CM | POA: Diagnosis not present

## 2022-03-23 DIAGNOSIS — E785 Hyperlipidemia, unspecified: Secondary | ICD-10-CM | POA: Diagnosis not present

## 2022-03-23 DIAGNOSIS — E119 Type 2 diabetes mellitus without complications: Secondary | ICD-10-CM | POA: Diagnosis not present

## 2022-03-23 DIAGNOSIS — F32A Depression, unspecified: Secondary | ICD-10-CM | POA: Diagnosis not present

## 2022-03-26 DIAGNOSIS — E119 Type 2 diabetes mellitus without complications: Secondary | ICD-10-CM | POA: Diagnosis not present

## 2022-04-07 DIAGNOSIS — L603 Nail dystrophy: Secondary | ICD-10-CM | POA: Diagnosis not present

## 2022-04-07 DIAGNOSIS — I739 Peripheral vascular disease, unspecified: Secondary | ICD-10-CM | POA: Diagnosis not present

## 2022-08-24 ENCOUNTER — Emergency Department (HOSPITAL_COMMUNITY): Payer: Medicare Other

## 2022-08-24 ENCOUNTER — Other Ambulatory Visit: Payer: Self-pay

## 2022-08-24 ENCOUNTER — Emergency Department (HOSPITAL_COMMUNITY)
Admission: EM | Admit: 2022-08-24 | Discharge: 2022-08-24 | Disposition: A | Discharge status: Home / Self Care | Payer: Medicare Other | Attending: Emergency Medicine | Admitting: Emergency Medicine

## 2022-08-24 DIAGNOSIS — R0789 Other chest pain: Secondary | ICD-10-CM | POA: Diagnosis present

## 2022-08-24 DIAGNOSIS — Z7982 Long term (current) use of aspirin: Secondary | ICD-10-CM | POA: Diagnosis not present

## 2022-08-24 DIAGNOSIS — R079 Chest pain, unspecified: Secondary | ICD-10-CM

## 2022-08-24 LAB — CBC
HCT: 36.3 % — ABNORMAL LOW (ref 39.0–52.0)
Hemoglobin: 11.5 g/dL — ABNORMAL LOW (ref 13.0–17.0)
MCH: 26.5 pg (ref 26.0–34.0)
MCHC: 31.7 g/dL (ref 30.0–36.0)
MCV: 83.6 fL (ref 80.0–100.0)
Platelets: 257 10*3/uL (ref 150–400)
RBC: 4.34 MIL/uL (ref 4.22–5.81)
RDW: 15.8 % — ABNORMAL HIGH (ref 11.5–15.5)
WBC: 6.1 10*3/uL (ref 4.0–10.5)
nRBC: 0 % (ref 0.0–0.2)

## 2022-08-24 LAB — BASIC METABOLIC PANEL
Anion gap: 7 (ref 5–15)
BUN: 16 mg/dL (ref 8–23)
CO2: 22 mmol/L (ref 22–32)
Calcium: 8.6 mg/dL — ABNORMAL LOW (ref 8.9–10.3)
Chloride: 110 mmol/L (ref 98–111)
Creatinine, Ser: 1.13 mg/dL (ref 0.61–1.24)
GFR, Estimated: >60 mL/min (ref >60)
Glucose, Bld: 110 mg/dL — ABNORMAL HIGH (ref 70–99)
Potassium: 3.7 mmol/L (ref 3.5–5.1)
Sodium: 139 mmol/L (ref 135–145)

## 2022-08-24 LAB — TROPONIN I (HIGH SENSITIVITY)
Troponin I (High Sensitivity): 4 ng/L (ref <18)
Troponin I (High Sensitivity): 6 ng/L (ref <18)

## 2022-08-24 MED ORDER — IOHEXOL 350 MG/ML SOLN
75.0000 mL | Freq: Once | INTRAVENOUS | Status: AC | PRN
  Administered 2022-08-24: 75 mL via INTRAVENOUS

## 2022-08-24 NOTE — ED Triage Notes (Signed)
Ems called out for chest pain and SOB. CP 7/10 to a 3/10 after calming down. Hr 94 bp 160/100 rr 22. Chest pain has no aggravating or alleviating factors. Chest pain has resolved but was central and dull. SOB has also resolved.

## 2022-08-24 NOTE — ED Provider Notes (Signed)
[facility] EMERGENCY DEPARTMENT Provider Note   CSN: [id number] Arrival date & time: 08/24/22  0030     History  Chief Complaint  Patient presents with   Shortness of Breath   Chest Pain    David [name] is a 66 y.o. male.  Patient presents to the emergency department for evaluation of chest pain and shortness of breath.  Patient reports that symptoms began this evening.  He initially had sharp pains in his chest that was 7 out of 10, felt very anxious.  Patient reports that symptoms are improving at time of arrival to the emergency department.       Home Medications Prior to Admission medications   Medication Sig Start Date End Date Taking? Authorizing Provider  amLODipine (NORVASC) 5 MG tablet Take 1 tablet (5 mg total) by mouth daily. 04/25/21   Simmons-Robinson, Riki Sheer, MD  aspirin EC 81 MG EC tablet Take 1 tablet (81 mg total) by mouth daily. Swallow whole. 04/25/21   Simmons-Robinson, Riki Sheer, MD  Baclofen 5 MG TABS Take 5 mg by mouth in the morning, at noon, and at bedtime. 06/19/21   Frann Rider, NP  escitalopram (LEXAPRO) 20 MG tablet Take 1 tablet (20 mg total) by mouth daily. 04/25/21   Simmons-Robinson, Riki Sheer, MD  rosuvastatin (CRESTOR) 40 MG tablet Take 1 tablet (40 mg total) by mouth daily. 04/25/21   Simmons-Robinson, Riki Sheer, MD  tamsulosin (FLOMAX) 0.4 MG CAPS capsule Take 1 capsule (0.4 mg total) by mouth daily. 04/25/21   Simmons-Robinson, Riki Sheer, MD      Allergies    Patient has no known allergies.    Review of Systems   Review of Systems  Physical Exam Updated Vital Signs BP (!) 153/109   Pulse 97   Temp 98.6 F (37 C) (Oral)   Resp (!) 24   Ht '5\' 10"'$  (1.778 m)   Wt 78.5 kg   SpO2 98%   BMI 24.83 kg/m  Physical Exam Vitals and nursing note reviewed.  Constitutional:      General: He is not in acute distress.    Appearance: He is well-developed.  HENT:     Head: Normocephalic and atraumatic.     Mouth/Throat:      Mouth: Mucous membranes are moist.  Eyes:     General: Vision grossly intact. Gaze aligned appropriately.     Extraocular Movements: Extraocular movements intact.     Conjunctiva/sclera: Conjunctivae normal.  Cardiovascular:     Rate and Rhythm: Normal rate and regular rhythm.     Pulses: Normal pulses.     Heart sounds: Normal heart sounds, S1 normal and S2 normal. No murmur heard.    No friction rub. No gallop.  Pulmonary:     Effort: Pulmonary effort is normal. No respiratory distress.     Breath sounds: Normal breath sounds.  Abdominal:     Palpations: Abdomen is soft.     Tenderness: There is no abdominal tenderness. There is no guarding or rebound.     Hernia: No hernia is present.  Musculoskeletal:        General: No swelling.     Cervical back: Full passive range of motion without pain, normal range of motion and neck supple. No pain with movement, spinous process tenderness or muscular tenderness. Normal range of motion.     Right lower leg: No edema.     Left lower leg: No edema.  Skin:    General: Skin is warm and dry.  Capillary Refill: Capillary refill takes less than 2 seconds.     Findings: No ecchymosis, erythema, lesion or wound.  Neurological:     Mental Status: He is alert and oriented to person, place, and time.     GCS: GCS eye subscore is 4. GCS verbal subscore is 5. GCS motor subscore is 6.     Cranial Nerves: Cranial nerves 2-12 are intact.     Sensory: Sensation is intact.     Motor: Motor function is intact. No weakness or abnormal muscle tone.     Coordination: Coordination is intact.  Psychiatric:        Mood and Affect: Mood normal.        Speech: Speech normal.        Behavior: Behavior normal.     ED Results / Procedures / Treatments   Labs (all labs ordered are listed, but only abnormal results are displayed) Labs Reviewed  BASIC METABOLIC PANEL - Abnormal; Notable for the following components:      Result Value   Glucose, Bld 110 (*)     Calcium 8.6 (*)    All other components within normal limits  CBC - Abnormal; Notable for the following components:   Hemoglobin 11.5 (*)    HCT 36.3 (*)    RDW 15.8 (*)    All other components within normal limits  TROPONIN I (HIGH SENSITIVITY)  TROPONIN I (HIGH SENSITIVITY)    EKG EKG Interpretation  Date/Time:  Monday August 24 2022 01:27:44 EST Ventricular Rate:  90 PR Interval:  132 QRS Duration: 72 QT Interval:  388 QTC Calculation: 474 R Axis:   41 Text Interpretation: Normal sinus rhythm Normal ECG When compared with ECG of 24-Aug-2022 00:51, No significant change was found Confirmed by Delora Fuel ([id number]) on 08/24/2022 1:56:17 AM  Radiology CT Angio Chest Pulmonary Embolism (PE) W or WO Contrast  Result Date: 08/24/2022 CLINICAL DATA:  Pulmonary embolism suspected EXAM: CT ANGIOGRAPHY CHEST WITH CONTRAST TECHNIQUE: Multidetector CT imaging of the chest was performed using the standard protocol during bolus administration of intravenous contrast. Multiplanar CT image reconstructions and MIPs were obtained to evaluate the vascular anatomy. RADIATION DOSE REDUCTION: This exam was performed according to the departmental dose-optimization program which includes automated exposure control, adjustment of the mA and/or kV according to patient size and/or use of iterative reconstruction technique. CONTRAST:  24m OMNIPAQUE IOHEXOL 350 MG/ML SOLN COMPARISON:  None Available. FINDINGS: Cardiovascular: Satisfactory opacification of the pulmonary arteries to the segmental level. No evidence of pulmonary embolism. Normal heart size. No pericardial effusion. Mediastinum/Nodes: No hematoma or pneumomediastinum. Lungs/Pleura: Dependent atelectasis. Mild centrilobular emphysema at the apices. Upper Abdomen: No acute finding Musculoskeletal: Hemangioma appearance replacing most of the T2 vertebral body, stable from 2022 neck CTA. No acute or aggressive finding. No invasive features the level  of hemangioma. Review of the MIP images confirms the above findings. IMPRESSION: 1. Negative for pulmo negative nary embolism or other acute finding. 2. Mild atelectasis. 3. Mild emphysema. Electronically Signed   By: JJorje GuildM.D.   On: 08/24/2022 07:00   DG Chest 2 View  Result Date: 08/24/2022 CLINICAL DATA:  Chest pain, weakness, and shortness of breath EXAM: CHEST - 2 VIEW COMPARISON:  04/05/2021 FINDINGS: Stable cardiomediastinal silhouette. Bibasilar atelectasis/scarring. No focal consolidation, pleural effusion, or pneumothorax. No acute osseous abnormality. IMPRESSION: No active cardiopulmonary disease. Electronically Signed   By: TPlacido SouM.D.   On: 08/24/2022 01:23    Procedures Procedures  Medications Ordered in ED Medications  iohexol (OMNIPAQUE) 350 MG/ML injection 75 mL (75 mLs Intravenous Contrast Given 08/24/22 9150)    ED Course/ Medical Decision Making/ A&P                           Medical Decision Making Amount and/or Complexity of Data Reviewed Labs: ordered. Radiology: ordered.  Risk Prescription drug management.   Presents to the emergency department for evaluation of chest pain and shortness of breath.  Patient seems somewhat anxious on arrival.  He was noted to have some elevation of his respiratory rate as well as blood pressure.  He does have a history of hypertension.  Patient with multiple cardiac risk factors but no known history of cardiac disease.  Patient's EKG does not show ischemia or infarct at arrival.  He has had 2 negative troponins.  He continues to do well here without further intervention.  CT angiography was performed.  No obvious aortic abnormalities, no PE, no lung pathology.  Etiology of pain is unclear but it does not seem cardiac at this time.  Patient has been appropriately screened with high-sensitivity troponins, EKG and has done well.  He has been seen in cardiology previously, will refer back to cardiology for  further outpatient evaluation of chest pain.  Return to the ER for worsening symptoms.        Final Clinical Impression(s) / ED Diagnoses Final diagnoses:  Chest pain, unspecified type    Rx / DC Orders ED Discharge Orders          Ordered    Ambulatory referral to Cardiology       Comments: If you have not heard from the Cardiology office within the next 72 hours please call [phone].   08/24/22 9396              Orpah Greek, MD 08/24/22 [phone]

## 2022-10-01 ENCOUNTER — Encounter: Payer: Self-pay | Admitting: Cardiology

## 2022-10-01 ENCOUNTER — Ambulatory Visit: Payer: Medicaid Other | Attending: Cardiology | Admitting: Cardiology

## 2023-02-13 IMAGING — CT CT CERVICAL SPINE W/O CM
3 series · 13 of 29 positions shown, 16 images · non-contrast
Comparison: MRI 04/23/2019, CT 04/23/2019, cervical spine
radiograph 07/07/2005

CLINICAL DATA: Multiple falls, head injury

EXAM:
CT HEAD WITHOUT CONTRAST
CT CERVICAL SPINE WITHOUT CONTRAST
TECHNIQUE: Multidetector CT imaging of the head and cervical spine was
performed following the standard protocol without intravenous
contrast. Multiplanar CT image reconstructions of the cervical spine
were also generated.

[Series 5: c spine soft · axial · 0.34mm/px · z∈[-260,-202]mm · 3 of 88 slices shown]
[im 15/88  soft-tissue]
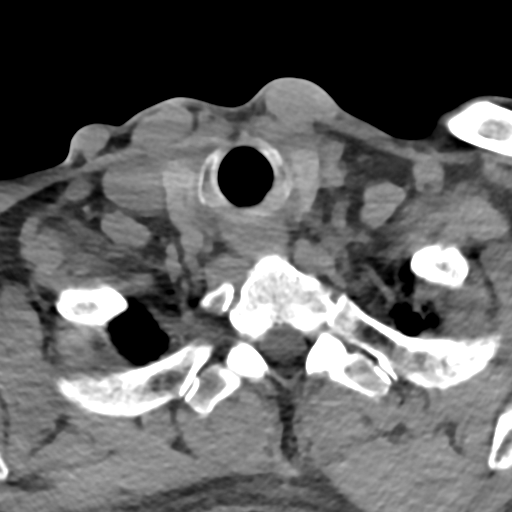
[im 30/88  soft-tissue]
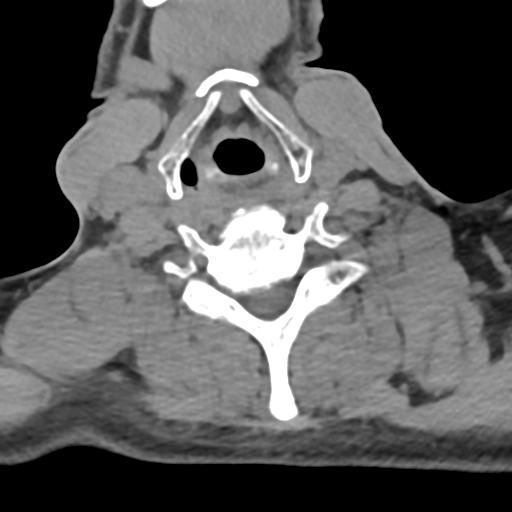
[im 44/88  soft-tissue]
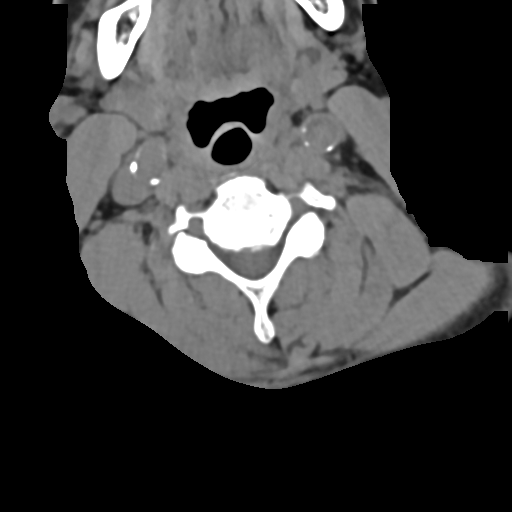

[Series 8: sag bone · sagittal · 0.43mm/px · 5 of 119 slices shown, 6 images]
[im 40/119  bone]
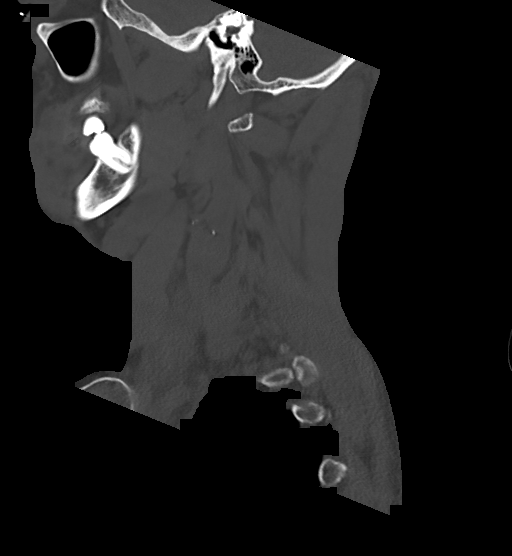
[im 50/119  bone]
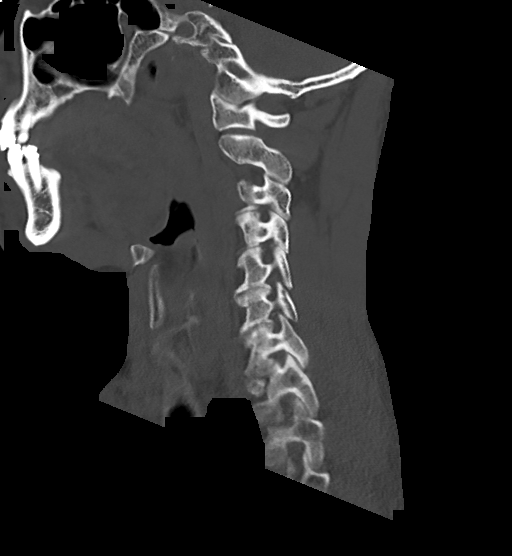
[im 60/119  soft-tissue]
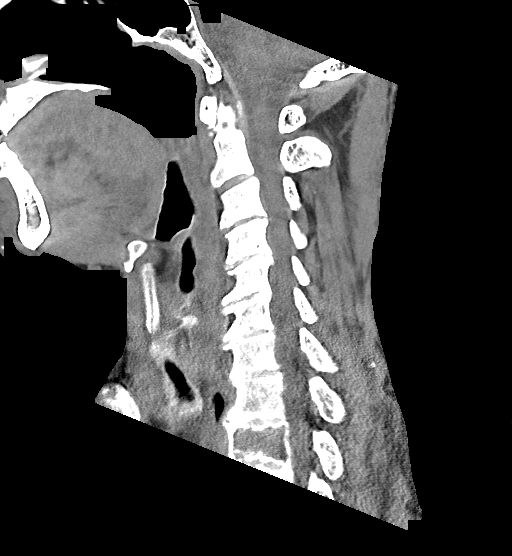
[im 60/119  bone]
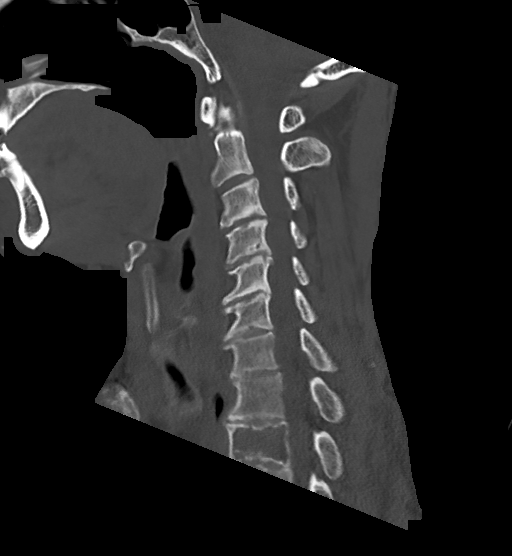
[im 69/119  bone]
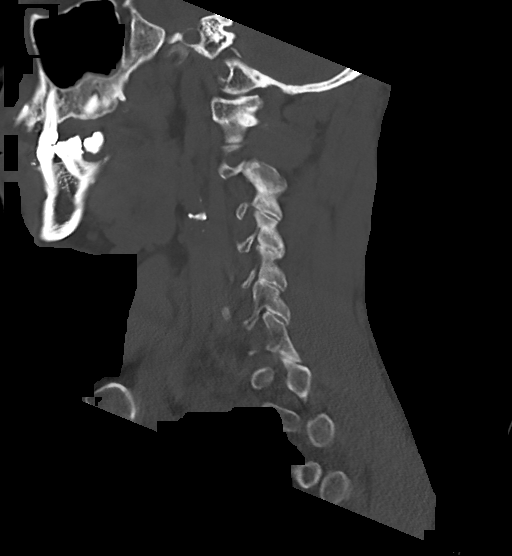
[im 79/119  bone]
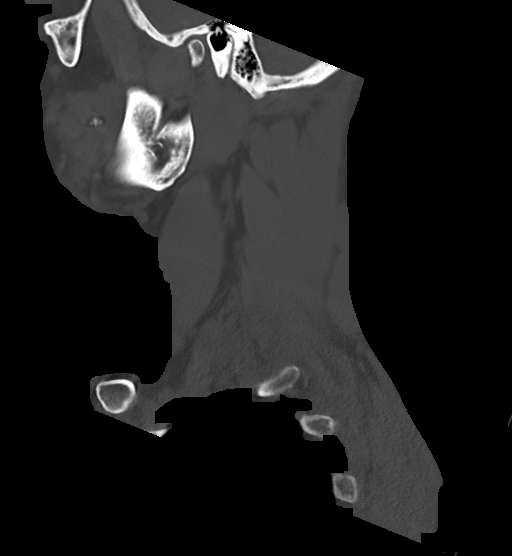

[Series 10: orthogonal axials · axial · 0.21mm/px · z∈[-283,-186]mm · 5 of 90 slices shown, 7 images]
[im 15/90  soft-tissue]
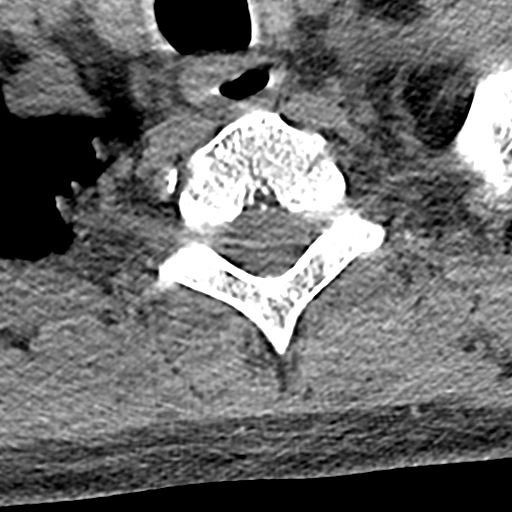
[im 15/90  bone]
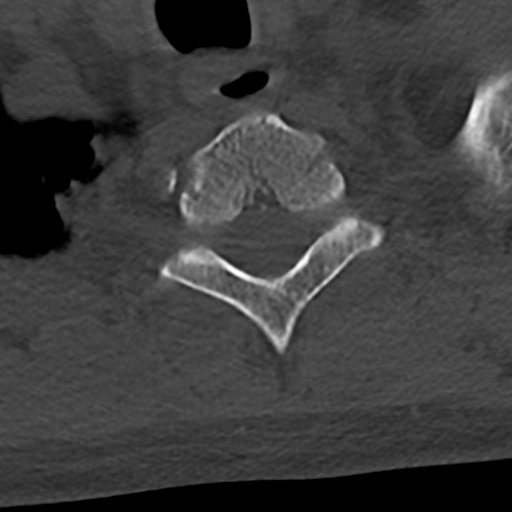
[im 30/90  bone]
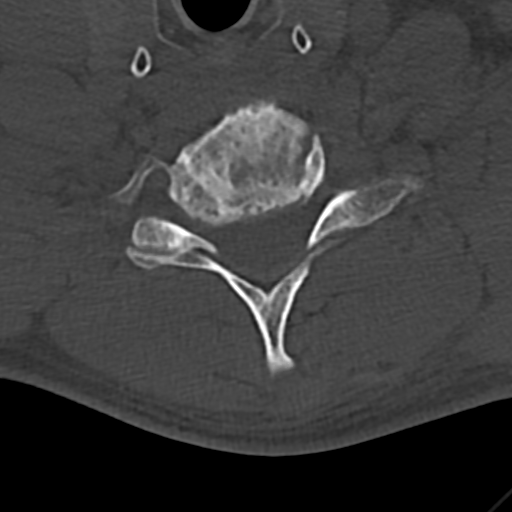
[im 45/90  bone]
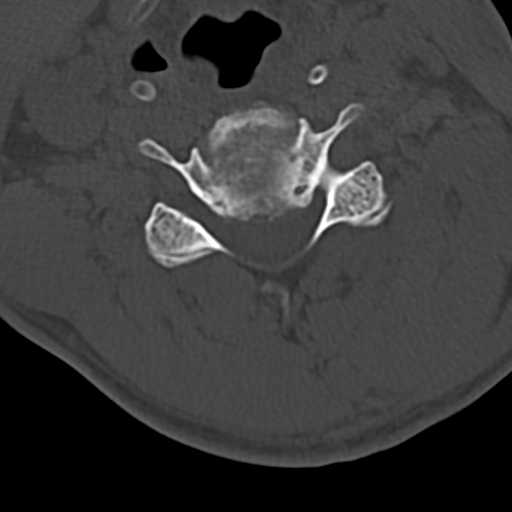
[im 60/90  bone]
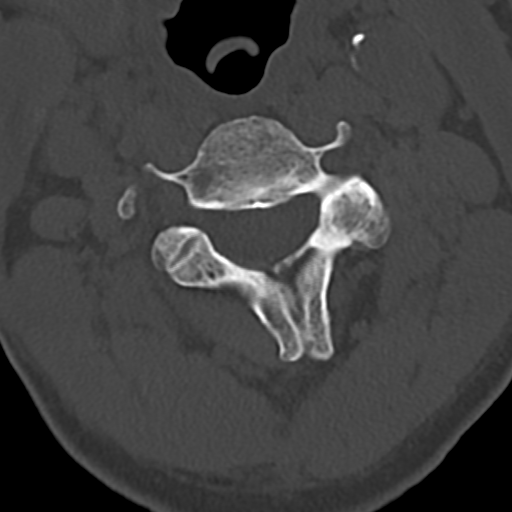
[im 75/90  soft-tissue]
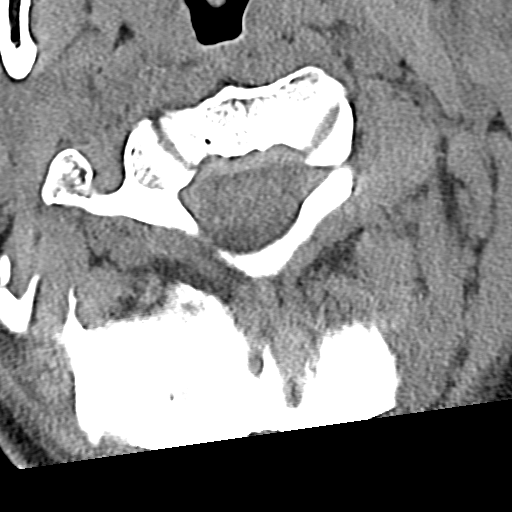
[im 75/90  bone]
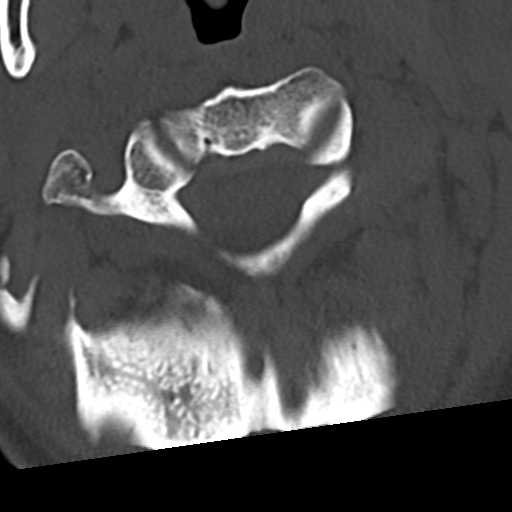

[13 of 29 positions shown; findings below may reference images not displayed]

FINDINGS: CT HEAD FINDINGS

Brain: Remote bilateral lacunar infarcts in the basal ganglia and
corona radiata as well as in the left pons with additional
background of diffuse patchy and confluent regions of white matter
hypoattenuation suggesting extensive underlying microvascular
angiopathy. Evidence of wallerian degeneration on the left as well
corresponding well with findings on prior MR imaging. Benign likely
senescent mineralization of the basal ganglia as well. No evidence
of acute infarction, hemorrhage, hydrocephalus, extra-axial
collection, visible mass lesion or mass effect. Symmetric prominence
of the ventricles, cisterns and sulci compatible with parenchymal
volume loss and ex vacuo dilatation.

Vascular: Atherosclerotic calcification of the carotid siphons. No
hyperdense vessel.

Skull: Right frontal scalp swelling and laceration with soft tissue
gas and thin crescentic scalp hematoma measuring up to 5 mm in
maximal thickness. No calvarial fracture or visible facial bone
fracture within the margins of imaging. Chronic left parietal fatty
scalp lesion most consistent with a posterior convexity lipoma.

Sinuses/Orbits: Paranasal sinuses and mastoid air cells are
predominantly clear. Asymmetric right palpebral thickening and
periorbital swelling extending from the scalp laceration. No retro
septal gas, stranding or hemorrhage. Orbits are otherwise
unremarkable.

Other: None.

CT CERVICAL SPINE FINDINGS

Alignment: Straightening and slight reversal the normal cervical
lordosis. No evidence of traumatic listhesis. Bony fusion of the
left C2-3 articular facet. No abnormally widened, perched or jumped
facets. Normal alignment of the craniocervical and atlantoaxial
articulations.

Skull base and vertebrae: No acute skull base fracture. No vertebral
body fracture or height loss. Lucent lesion with some coarse
trabeculation in the T2 vertebral body extending into the left
pedicle has an appearance most compatible with a large vertebral
body hemangioma. No other conspicuous osseous lesion. Multilevel
Schmorl's node formations. Cervical spondylitic changes as detailed
below. Additional arthrosis at the atlantodental interval with some
mild calcific pannus formation.

Soft tissues and spinal canal: No pre or paravertebral fluid or
swelling. No visible canal hematoma. Airways patent. Cervical
carotid atherosclerosis. No suspicious adenopathy.

Disc levels: Multilevel intervertebral disc height loss with
spondylitic endplate changes. Multilevel posterior disc osteophyte
complexes C3-4, C4-5, C6-7, C7-T1 resulting in mild canal stenosis,
partial effacement of the ventral thecal sac at the remaining
levels. Uncinate spurring and facet hypertrophic changes are noted
throughout the cervical levels resulting in mild-to-moderate
multilevel neural foraminal narrowing as well.

Upper chest: Apical centrilobular emphysema. No acute abnormality in
the upper chest or imaged lung apices.

Other: Normal thyroid.
IMPRESSION: 1. Right frontal scalp laceration and crescentic scalp hematoma. No
subjacent calvarial fracture.
2. No acute intracranial abnormality. No acute cervical spine
fracture or traumatic listhesis.
3. Straightening and reversal the cervical spine may be related to
positioning or muscle spasm.
4. Chronic lacunar type infarcts and extensive microvascular
angiopathy and features of prior left wallerian degeneration.
5. Multilevel cervical spondylitic changes, as detailed above.
6. Lucency and coarsened trabecular bone at the T2 vertebral body,
appearance most consistent with a large vertebral body hemangioma.
7. Arthrosis at the atlantodental interval with some calcific pannus
formation posterior to the dens, nonspecific though can be seen as
early manifestation of CPPD or rheumatoid arthropathy.

## 2023-11-13 ENCOUNTER — Other Ambulatory Visit: Payer: Self-pay

## 2023-11-13 ENCOUNTER — Emergency Department (HOSPITAL_COMMUNITY)
Admission: EM | Admit: 2023-11-13 | Discharge: 2023-11-13 | Disposition: A | Discharge status: Home / Self Care | Payer: Self-pay | Attending: Emergency Medicine | Admitting: Emergency Medicine

## 2023-11-13 ENCOUNTER — Encounter (HOSPITAL_COMMUNITY): Payer: Self-pay

## 2023-11-13 ENCOUNTER — Emergency Department (HOSPITAL_COMMUNITY): Payer: Self-pay

## 2023-11-13 DIAGNOSIS — I10 Essential (primary) hypertension: Secondary | ICD-10-CM | POA: Insufficient documentation

## 2023-11-13 DIAGNOSIS — S0181XA Laceration without foreign body of other part of head, initial encounter: Secondary | ICD-10-CM | POA: Diagnosis not present

## 2023-11-13 DIAGNOSIS — Z79899 Other long term (current) drug therapy: Secondary | ICD-10-CM | POA: Diagnosis not present

## 2023-11-13 DIAGNOSIS — S0990XA Unspecified injury of head, initial encounter: Secondary | ICD-10-CM | POA: Diagnosis present

## 2023-11-13 DIAGNOSIS — Z7982 Long term (current) use of aspirin: Secondary | ICD-10-CM | POA: Insufficient documentation

## 2023-11-13 DIAGNOSIS — W010XXA Fall on same level from slipping, tripping and stumbling without subsequent striking against object, initial encounter: Secondary | ICD-10-CM | POA: Insufficient documentation

## 2023-11-13 DIAGNOSIS — W19XXXA Unspecified fall, initial encounter: Secondary | ICD-10-CM

## 2023-11-13 LAB — BASIC METABOLIC PANEL
Anion gap: 12 (ref 5–15)
BUN: 10 mg/dL (ref 8–23)
CO2: 23 mmol/L (ref 22–32)
Calcium: 9.1 mg/dL (ref 8.9–10.3)
Chloride: 106 mmol/L (ref 98–111)
Creatinine, Ser: 0.94 mg/dL (ref 0.61–1.24)
GFR, Estimated: >60 mL/min (ref >60)
Glucose, Bld: 107 mg/dL — ABNORMAL HIGH (ref 70–99)
Potassium: 3.8 mmol/L (ref 3.5–5.1)
Sodium: 141 mmol/L (ref 135–145)

## 2023-11-13 LAB — CBC
HCT: 41.3 % (ref 39.0–52.0)
Hemoglobin: 13.8 g/dL (ref 13.0–17.0)
MCH: 28.1 pg (ref 26.0–34.0)
MCHC: 33.4 g/dL (ref 30.0–36.0)
MCV: 84.1 fL (ref 80.0–100.0)
Platelets: 220 10*3/uL (ref 150–400)
RBC: 4.91 MIL/uL (ref 4.22–5.81)
RDW: 15.6 % — ABNORMAL HIGH (ref 11.5–15.5)
WBC: 5.6 10*3/uL (ref 4.0–10.5)
nRBC: 0 % (ref 0.0–0.2)

## 2023-11-13 MED ORDER — AMLODIPINE BESYLATE 5 MG PO TABS
10.0000 mg | ORAL_TABLET | Freq: Once | ORAL | Status: AC
  Administered 2023-11-13: 10 mg via ORAL
  Filled 2023-11-13: qty 2

## 2023-11-13 MED ORDER — AMLODIPINE BESYLATE 10 MG PO TABS: 10.0000 mg | ORAL_TABLET | Freq: Every day | ORAL | 0 refills | Status: DC

## 2023-11-13 MED ORDER — LIDOCAINE-EPINEPHRINE (PF) 2 %-1:200000 IJ SOLN
10.0000 mL | Freq: Once | INTRAMUSCULAR | Status: AC
  Administered 2023-11-13: 10 mL
  Filled 2023-11-13: qty 20

## 2023-11-13 NOTE — ED Triage Notes (Signed)
 PT BIB [facility] after falling from standing height off of [address]. PT hit his head when falling, has a bandaged laceration roughly 1 inch long above the L eyebrow. PT denies LOC or dizziness now or during incident. PT states he takes no medication currently, has prev Hx of CVA 3 years ago with RS weakness and speech delays as baseline.  [facility] vitals BP 164/100, HR 77, CBG 108. PT endorses 8/10 pain. C collar in place.

## 2023-11-13 NOTE — ED Provider Notes (Signed)
 [place] EMERGENCY DEPARTMENT AT [facility] Provider Note   CSN: [id number] Arrival date & time: 11/13/23  1603     History No chief complaint on file.   David [name] is a 68 y.o. male with medical history of CVA, AKI, cerebral infarction, coronary infarction.  Patient presents to ED for evaluation of fall and head injury.  States that he slipped today on a curb.  Reports that he fell face forward striking his head.  Denies blood thinners or loss of consciousness.  Denies headache, nausea, vomiting, photophobia.  Denies blood thinners.  States last tetanus update was in 2022.  Denies any other concerns.  HPI     Home Medications Prior to Admission medications   Medication Sig Start Date End Date Taking? Authorizing Provider  amLODipine (NORVASC) 10 MG tablet Take 1 tablet (10 mg total) by mouth daily. 11/13/23  Yes Al Decant, PA-C  aspirin EC 81 MG EC tablet Take 1 tablet (81 mg total) by mouth daily. Swallow whole. 04/25/21   Simmons-Robinson, Tawanna Cooler, MD  Baclofen 5 MG TABS Take 5 mg by mouth in the morning, at noon, and at bedtime. 06/19/21   Ihor Austin, NP  escitalopram (LEXAPRO) 20 MG tablet Take 1 tablet (20 mg total) by mouth daily. 04/25/21   Simmons-Robinson, Tawanna Cooler, MD  rosuvastatin (CRESTOR) 40 MG tablet Take 1 tablet (40 mg total) by mouth daily. 04/25/21   Simmons-Robinson, Tawanna Cooler, MD  tamsulosin (FLOMAX) 0.4 MG CAPS capsule Take 1 capsule (0.4 mg total) by mouth daily. 04/25/21   Simmons-Robinson, Tawanna Cooler, MD      Allergies    Patient has no known allergies.    Review of Systems   Review of Systems  Musculoskeletal:  Negative for neck pain.  Skin:  Positive for wound.  Neurological:  Negative for syncope and headaches.  All other systems reviewed and are negative.   Physical Exam Updated Vital Signs BP (!) 154/138   Pulse 79   Temp 98.5 F (36.9 C) (Oral)   Resp 20   Ht 5\' 10"  (1.778 m)   Wt 68 kg   SpO2 100%   BMI 21.52 kg/m   Physical Exam Vitals and nursing note reviewed.  Constitutional:      General: He is not in acute distress.    Appearance: He is well-developed.  HENT:     Head: Normocephalic.   Eyes:     Conjunctiva/sclera: Conjunctivae normal.  Cardiovascular:     Rate and Rhythm: Normal rate and regular rhythm.     Heart sounds: No murmur heard. Pulmonary:     Effort: Pulmonary effort is normal. No respiratory distress.     Breath sounds: Normal breath sounds.  Abdominal:     Palpations: Abdomen is soft.     Tenderness: There is no abdominal tenderness.  Musculoskeletal:        General: No swelling.     Cervical back: Neck supple.  Skin:    General: Skin is warm and dry.     Capillary Refill: Capillary refill takes less than 2 seconds.  Neurological:     Mental Status: He is alert. Mental status is at baseline.  Psychiatric:        Mood and Affect: Mood normal.     ED Results / Procedures / Treatments   Labs (all labs ordered are listed, but only abnormal results are displayed) Labs Reviewed  CBC - Abnormal; Notable for the following components:      Result Value  RDW 15.6 (*)    All other components within normal limits  BASIC METABOLIC PANEL - Abnormal; Notable for the following components:   Glucose, Bld 107 (*)    All other components within normal limits    EKG None  Radiology CT Head Wo Contrast Result Date: 11/13/2023 CLINICAL DATA:  Trauma EXAM: CT HEAD WITHOUT CONTRAST CT CERVICAL SPINE WITHOUT CONTRAST TECHNIQUE: Multidetector CT imaging of the head and cervical spine was performed following the standard protocol without intravenous contrast. Multiplanar CT image reconstructions of the cervical spine were also generated. RADIATION DOSE REDUCTION: This exam was performed according to the departmental dose-optimization program which includes automated exposure control, adjustment of the mA and/or kV according to patient size and/or use of iterative reconstruction  technique. COMPARISON:  04/05/2021 FINDINGS: CT HEAD FINDINGS Brain: There is no mass, hemorrhage or extra-axial collection. There is generalized atrophy without lobar predilection. Hypodensity of the white matter is most commonly associated with chronic microvascular disease. Vascular: No hyperdense vessel or unexpected vascular calcification. Skull: The visualized skull base, calvarium and extracranial soft tissues are normal. Sinuses/Orbits: No fluid levels or advanced mucosal thickening of the visualized paranasal sinuses. No mastoid or middle ear effusion. Normal orbits. Other: None. CT CERVICAL SPINE FINDINGS Alignment: No static subluxation. Facets are aligned. Occipital condyles are normally positioned. Skull base and vertebrae: No acute fracture. Soft tissues and spinal canal: No prevertebral fluid or swelling. No visible canal hematoma. Disc levels: No advanced spinal canal or neural foraminal stenosis. Upper chest: No pneumothorax, pulmonary nodule or pleural effusion. Other: Normal visualized paraspinal cervical soft tissues. IMPRESSION: 1. No acute intracranial abnormality. 2. No acute fracture or static subluxation of the cervical spine. Electronically Signed   By: Deatra Robinson M.D.   On: 11/13/2023 19:11   CT Cervical Spine Wo Contrast Result Date: 11/13/2023 CLINICAL DATA:  Trauma EXAM: CT HEAD WITHOUT CONTRAST CT CERVICAL SPINE WITHOUT CONTRAST TECHNIQUE: Multidetector CT imaging of the head and cervical spine was performed following the standard protocol without intravenous contrast. Multiplanar CT image reconstructions of the cervical spine were also generated. RADIATION DOSE REDUCTION: This exam was performed according to the departmental dose-optimization program which includes automated exposure control, adjustment of the mA and/or kV according to patient size and/or use of iterative reconstruction technique. COMPARISON:  04/05/2021 FINDINGS: CT HEAD FINDINGS Brain: There is no mass,  hemorrhage or extra-axial collection. There is generalized atrophy without lobar predilection. Hypodensity of the white matter is most commonly associated with chronic microvascular disease. Vascular: No hyperdense vessel or unexpected vascular calcification. Skull: The visualized skull base, calvarium and extracranial soft tissues are normal. Sinuses/Orbits: No fluid levels or advanced mucosal thickening of the visualized paranasal sinuses. No mastoid or middle ear effusion. Normal orbits. Other: None. CT CERVICAL SPINE FINDINGS Alignment: No static subluxation. Facets are aligned. Occipital condyles are normally positioned. Skull base and vertebrae: No acute fracture. Soft tissues and spinal canal: No prevertebral fluid or swelling. No visible canal hematoma. Disc levels: No advanced spinal canal or neural foraminal stenosis. Upper chest: No pneumothorax, pulmonary nodule or pleural effusion. Other: Normal visualized paraspinal cervical soft tissues. IMPRESSION: 1. No acute intracranial abnormality. 2. No acute fracture or static subluxation of the cervical spine. Electronically Signed   By: Deatra Robinson M.D.   On: 11/13/2023 19:11    Procedures .Laceration Repair  Date/Time: 11/13/2023 8:50 PM  Performed by: Al Decant, PA-C Authorized by: Al Decant, PA-C   Consent:    Consent obtained:  Verbal  Consent given by:  Patient   Risks discussed:  Infection, need for additional repair, nerve damage, poor wound healing, poor cosmetic result, pain, retained foreign body, tendon damage and vascular damage Universal protocol:    Patient identity confirmed:  Verbally with patient and arm band Anesthesia:    Anesthesia method:  Local infiltration   Local anesthetic:  Lidocaine 2% WITH epi Laceration details:    Location:  Face   Face location:  L eyebrow   Length (cm):  2 Treatment:    Area cleansed with:  Saline   Amount of cleaning:  Extensive   Irrigation solution:  Sterile  saline   Irrigation method:  Pressure wash   Visualized foreign bodies/material removed: no   Skin repair:    Repair method:  Sutures   Suture size:  4-0   Suture material:  Prolene   Suture technique:  Simple interrupted   Number of sutures:  3 Approximation:    Approximation:  Close Repair type:    Repair type:  Simple Post-procedure details:    Dressing:  Antibiotic ointment and bulky dressing   Procedure completion:  Tolerated well, no immediate complications    Medications Ordered in ED Medications  lidocaine-EPINEPHrine (XYLOCAINE W/EPI) 2 %-1:200000 (PF) injection 10 mL (has no administration in time range)  amLODipine (NORVASC) tablet 10 mg (10 mg Oral Given 11/13/23 1850)    ED Course/ Medical Decision Making/ A&P  Medical Decision Making Amount and/or Complexity of Data Reviewed Labs: ordered. Radiology: ordered.  Risk Prescription drug management.   68 year old male presents for evaluation.  Please see HPI for further details.  On exam patient is afebrile and nontachycardic.  His lung sounds are clear bilaterally, he is not hypoxic.  Abdomen is soft and compressible throughout.  Neurological examinations at baseline.  Patient does have a 2 cm laceration above his left eyebrow.  No tenderness to cervical spine.  Will collect labs, CT head, CT cervical spine.  Chart reviewed, patient had tetanus updated in 2022  Patient wound repaired per procedure note.  CT head, cervical spine unremarkable for acute injuries.  Lab work unremarkable, reassuring.  Patient was provided with 10 mg amlodipine due to high blood pressure.  He denies chest pain, shortness of breath, blurred vision or headache.  He has asymptomatic hypertension.  He has a history of hypertension, has not been taking his high blood pressure medication.  Will restart him on this today.  I have advised him to follow-up with his PCP and he voiced understanding.  He had all of his questions answered to his  satisfaction.  He is stable to discharge.  Final Clinical Impression(s) / ED Diagnoses Final diagnoses:  Fall, initial encounter  Facial laceration, initial encounter  Asymptomatic hypertension    Rx / DC Orders ED Discharge Orders          Ordered    amLODipine (NORVASC) 10 MG tablet  Daily        11/13/23 2055              Clent Ridges 11/13/23 2055    Benjiman Core, MD 11/13/23 2146

## 2023-11-13 NOTE — ED Notes (Signed)
 Daughter David [name] called and stated she would come pick patient up. Patient placed in waiting room.

## 2023-11-13 NOTE — Discharge Instructions (Addendum)
 It was a pleasure taking part in your care.  As we discussed, you will need to return in 7 to 10 days to have sutures removed.  You may return to the urgent care setting, PCP or this ED for removal of sutures.  Please begin taking 10 mg amlodipine once a day for high blood pressure.  Please return to the ED with any new or worsening symptoms such as chest pain, shortness of breath, blurred vision or headache.  Please begin taking your blood pressure twice a day and recording this in a log to provide to your PCP.

## 2023-11-19 ENCOUNTER — Emergency Department (HOSPITAL_COMMUNITY)
Admission: EM | Admit: 2023-11-19 | Discharge: 2023-11-19 | Disposition: A | Discharge status: Home / Self Care | Attending: Emergency Medicine | Admitting: Emergency Medicine

## 2023-11-19 ENCOUNTER — Encounter (HOSPITAL_COMMUNITY): Payer: Self-pay

## 2023-11-19 ENCOUNTER — Other Ambulatory Visit: Payer: Self-pay

## 2023-11-19 DIAGNOSIS — W010XXA Fall on same level from slipping, tripping and stumbling without subsequent striking against object, initial encounter: Secondary | ICD-10-CM | POA: Insufficient documentation

## 2023-11-19 DIAGNOSIS — Y92480 Sidewalk as the place of occurrence of the external cause: Secondary | ICD-10-CM | POA: Insufficient documentation

## 2023-11-19 DIAGNOSIS — S0181XA Laceration without foreign body of other part of head, initial encounter: Secondary | ICD-10-CM | POA: Insufficient documentation

## 2023-11-19 DIAGNOSIS — S0993XA Unspecified injury of face, initial encounter: Secondary | ICD-10-CM | POA: Diagnosis present

## 2023-11-19 DIAGNOSIS — W19XXXA Unspecified fall, initial encounter: Secondary | ICD-10-CM

## 2023-11-19 NOTE — ED Provider Notes (Signed)
 [place] EMERGENCY DEPARTMENT AT [facility] Provider Note   CSN: [id number] Arrival date & time: 11/19/23  1011     History  Chief Complaint  Patient presents with   David [name] is a 68 y.o. male.  HPI 68 year old male with a prior history of a stroke as well as hypertension presents with a fall.  Patient states he tripped and fell.  Bystanders called EMS and he states he did not want to come to the hospital but they convinced him to do so.  He denies any injuries.  He denies any preceding dizziness or current dizziness.  No headache, chest pain, shortness of breath, or focal weakness.  He does not feel weaker than normal and feels like he is at his baseline.  Home Medications Prior to Admission medications   Medication Sig Start Date End Date Taking? Authorizing Provider  amLODipine (NORVASC) 10 MG tablet Take 1 tablet (10 mg total) by mouth daily. 11/13/23   Al Decant, PA-C  aspirin EC 81 MG EC tablet Take 1 tablet (81 mg total) by mouth daily. Swallow whole. 04/25/21   Simmons-Robinson, Tawanna Cooler, MD  Baclofen 5 MG TABS Take 5 mg by mouth in the morning, at noon, and at bedtime. 06/19/21   Ihor Austin, NP  escitalopram (LEXAPRO) 20 MG tablet Take 1 tablet (20 mg total) by mouth daily. 04/25/21   Simmons-Robinson, Tawanna Cooler, MD  rosuvastatin (CRESTOR) 40 MG tablet Take 1 tablet (40 mg total) by mouth daily. 04/25/21   Simmons-Robinson, Tawanna Cooler, MD  tamsulosin (FLOMAX) 0.4 MG CAPS capsule Take 1 capsule (0.4 mg total) by mouth daily. 04/25/21   Simmons-Robinson, Tawanna Cooler, MD      Allergies    Patient has no known allergies.    Review of Systems   Review of Systems  Respiratory:  Negative for shortness of breath.   Cardiovascular:  Negative for chest pain.  Gastrointestinal:  Negative for abdominal pain.  Neurological:  Negative for dizziness, weakness and headaches.    Physical Exam Updated Vital Signs BP (!) 152/87 (BP Location: Left Arm)    Pulse 81   Temp 97.6 F (36.4 C) (Oral)   Resp 17   Ht 5\' 10"  (1.778 m)   Wt 68 kg   SpO2 100%   BMI 21.51 kg/m  Physical Exam Vitals and nursing note reviewed.  Constitutional:      General: He is not in acute distress.    Appearance: He is well-developed. He is not ill-appearing or diaphoretic.  HENT:     Head: Normocephalic.  Eyes:     Extraocular Movements: Extraocular movements intact.  Cardiovascular:     Rate and Rhythm: Normal rate and regular rhythm.     Heart sounds: Normal heart sounds.  Pulmonary:     Effort: Pulmonary effort is normal.     Breath sounds: Normal breath sounds.  Abdominal:     General: There is no distension.  Skin:    General: Skin is warm and dry.  Neurological:     Mental Status: He is alert and oriented to person, place, and time.     Comments: CN 3-12 grossly intact. 5/5 strength in all 4 extremities. Grossly normal sensation. Normal finger to nose. Able to ambulate on his own at his baseline.     ED Results / Procedures / Treatments   Labs (all labs ordered are listed, but only abnormal results are displayed) Labs Reviewed - No data to display  EKG  None  Radiology No results found.  Procedures Procedures    Medications Ordered in ED Medications - No data to display  ED Course/ Medical Decision Making/ A&P                                 Medical Decision Making  Patient was seen about a week ago for a fall after he tripped and had a facial laceration.  He had labs at that time which were unremarkable.  Today, sounds like a mechanical fall and he has no current symptoms and appears stable for discharge.  He ambulated with what he says is at his baseline.  Sounds like a mechanical fall without any injuries or focal deficits and no other symptoms to suggest a workup at this time.  He feels comfortable with discharge.  Will discharge home with return precautions.        Final Clinical Impression(s) / ED Diagnoses Final  diagnoses:  Fall, initial encounter    Rx / DC Orders ED Discharge Orders     None         Pricilla Loveless, MD 11/19/23 1040

## 2023-11-19 NOTE — Discharge Instructions (Signed)
 If you develop headache, chest pain, shortness of breath, weakness, or any other new/concerning symptoms and return to the ER or call 911.

## 2023-11-19 NOTE — ED Triage Notes (Signed)
 Pt was found on sidewalk by passerby. Pt stated he tripped and fell. Pt denies LOC. Pt denies hitting head. Pt denies blood thinners. Pt is A&Ox4. Pt denies pain

## 2023-12-15 ENCOUNTER — Ambulatory Visit (HOSPITAL_COMMUNITY): Admission: EM | Admit: 2023-12-15 | Discharge: 2023-12-15 | Disposition: A | Discharge status: Home / Self Care

## 2023-12-15 ENCOUNTER — Encounter (HOSPITAL_COMMUNITY): Payer: Self-pay

## 2023-12-15 DIAGNOSIS — Z4802 Encounter for removal of sutures: Secondary | ICD-10-CM | POA: Diagnosis not present

## 2023-12-15 NOTE — ED Provider Notes (Signed)
 [facility]    CSN: [id number] Arrival date & time: 12/15/23  0901      History   Chief Complaint Chief Complaint  Patient presents with   Suture / Staple Removal    HPI David [name] is a 68 y.o. male.   Patient presents to clinic for suture removal.   On 3/15 patient was seen at local ED for fall, had normal labs and negative head CT. 3 sutures were placed at this visit in left eyebrow for laceration repair. Presents to clinic 32 days later for suture removal. Area has healed well, denies issues.   The history is provided by the patient and medical records.    Past Medical History:  Diagnosis Date   AKI (acute kidney injury) (HCC) 09/01/2017   Benign prostatic hyperplasia    Cerebral infarction (HCC) 08/04/2017   Late acute/ Early subacute infarction centered left putamen,corona radiata, and caudate body measuring 5.1 cc.   Cerebrovascular accident (CVA) (HCC) 03/13/2021   Lacunar infarction (HCC)    MRI 08/04/2017: Chronic lacunar infarcts in left pons andright anterior basal ganglia.   Lipoma of scalp    Head CT 02/2021    Patient Active Problem List   Diagnosis Date Noted   Homeless 04/10/2021   Encephalopathy acute 04/10/2021   Cerebral thrombosis with cerebral infarction 04/07/2021   COVID-19    Acute kidney insufficiency    Altered mental status 04/05/2021   Concussion with no loss of consciousness    Laceration of forehead    Strain of right pectoralis muscle 03/14/2020   Bilateral leg weakness 03/14/2020   Numbness of right thumb 10/26/2018   Health care maintenance 10/26/2018   History of stroke 04/29/2018   Hearing impaired 04/29/2018   Erectile dysfunction 09/01/2017   Mild tetrahydrocannabinol (THC) abuse    Slurred speech 08/03/2017   Left hip pain 11/06/2016   Prediabetes 11/06/2016   Labral tear of shoulder 01/25/2014   Adhesive capsulitis of left shoulder 12/05/2013   Trigger point of neck 08/16/2012   Hyperlipidemia  08/08/2012   Elevated LFTs 08/08/2012   Cocaine abuse (HCC) 08/08/2012   Chronic pain 06/01/2012   BPH (benign prostatic hyperplasia) 09/09/2011   ROTATOR CUFF TEAR 12/03/2008    Past Surgical History:  Procedure Laterality Date   SHOULDER ARTHROSCOPY W/ ROTATOR CUFF REPAIR  10/21/10   At [facility], R shoulder       Home Medications    Prior to Admission medications   Medication Sig Start Date End Date Taking? Authorizing Provider  amLODipine (NORVASC) 10 MG tablet Take 1 tablet (10 mg total) by mouth daily. 11/13/23   Adel Aden, PA-C  aspirin EC 81 MG EC tablet Take 1 tablet (81 mg total) by mouth daily. Swallow whole. 04/25/21   Simmons-Robinson, Judyann Number, MD  Baclofen 5 MG TABS Take 5 mg by mouth in the morning, at noon, and at bedtime. 06/19/21   Johny Nap, NP  escitalopram (LEXAPRO) 20 MG tablet Take 1 tablet (20 mg total) by mouth daily. 04/25/21   Simmons-Robinson, Judyann Number, MD  rosuvastatin (CRESTOR) 40 MG tablet Take 1 tablet (40 mg total) by mouth daily. 04/25/21   Simmons-Robinson, Judyann Number, MD  tamsulosin (FLOMAX) 0.4 MG CAPS capsule Take 1 capsule (0.4 mg total) by mouth daily. 04/25/21   Simmons-Robinson, Judyann Number, MD    Family History History reviewed. No pertinent family history.  Social History Social History   Tobacco Use   Smoking status: Never   Smokeless tobacco: Never  Substance  Use Topics   Alcohol use: No    Comment: occasion   Drug use: No     Allergies   Patient has no known allergies.   Review of Systems Review of Systems  Per HPI  Physical Exam Triage Vital Signs ED Triage Vitals  Encounter Vitals Group     BP      Systolic BP Percentile      Diastolic BP Percentile      Pulse      Resp      Temp      Temp src      SpO2      Weight      Height      Head Circumference      Peak Flow      Pain Score      Pain Loc      Pain Education      Exclude from Growth Chart    No data found.  Updated Vital Signs BP (!)  162/103 (BP Location: Left Arm)   Pulse 77   Temp (!) 97.5 F (36.4 C) (Oral)   Resp 18   SpO2 98%   Visual Acuity Right Eye Distance:   Left Eye Distance:   Bilateral Distance:    Right Eye Near:   Left Eye Near:    Bilateral Near:     Physical Exam Vitals and nursing note reviewed.  Constitutional:      Appearance: Normal appearance.  HENT:     Head: Normocephalic.     Right Ear: External ear normal.     Left Ear: External ear normal.     Nose: Nose normal.     Mouth/Throat:     Mouth: Mucous membranes are moist.  Eyes:     Conjunctiva/sclera: Conjunctivae normal.  Cardiovascular:     Rate and Rhythm: Normal rate.  Pulmonary:     Effort: Pulmonary effort is normal. No respiratory distress.  Musculoskeletal:        General: Normal range of motion.  Skin:    General: Skin is warm and dry.  Neurological:     General: No focal deficit present.     Mental Status: He is alert.  Psychiatric:        Mood and Affect: Mood normal.      UC Treatments / Results  Labs (all labs ordered are listed, but only abnormal results are displayed) Labs Reviewed - No data to display  EKG   Radiology No results found.  Procedures Procedures (including critical care time)  Medications Ordered in UC Medications - No data to display  Initial Impression / Assessment and Plan / UC Course  I have reviewed the triage vital signs and the nursing notes.  Pertinent labs & imaging results that were available during my care of the patient were reviewed by me and considered in my medical decision making (see chart for details).  Vitals in triage reviewed, patient is hemodynamically stable.  Three sutures to the left eyebrow area removed by staff, appears to have healed well over the past month.  Without signs of infection such as redness, erythema, drainage or swelling.  Plan of care, follow-up care return precautions given, no questions at this time.     Final Clinical  Impressions(s) / UC Diagnoses   Final diagnoses:  Visit for suture removal   Discharge Instructions   None    ED Prescriptions   None    PDMP not reviewed this encounter.  Harlow Lighter, Bralin Garry  N, FNP 12/15/23 [phone]

## 2023-12-15 NOTE — ED Triage Notes (Signed)
 Pt here for suture removal on left forehead. Patient states they were placed at [facility] hospital. Family member states it has been about a month since they were put in.

## 2024-05-24 ENCOUNTER — Encounter: Payer: Self-pay | Admitting: Gastroenterology

## 2024-05-30 ENCOUNTER — Encounter: Payer: Self-pay | Admitting: Gastroenterology

## 2024-05-30 ENCOUNTER — Ambulatory Visit (AMBULATORY_SURGERY_CENTER)

## 2024-05-30 VITALS — Ht 70.0 in | Wt 138.0 lb

## 2024-05-30 DIAGNOSIS — Z860100 Personal history of colon polyps, unspecified: Secondary | ICD-10-CM

## 2024-05-30 MED ORDER — NA SULFATE-K SULFATE-MG SULF 17.5-3.13-1.6 GM/177ML PO SOLN
1.0000 | Freq: Once | ORAL | 0 refills | Status: DC
Start: 2024-05-30 — End: 2024-05-30

## 2024-05-30 MED ORDER — NA SULFATE-K SULFATE-MG SULF 17.5-3.13-1.6 GM/177ML PO SOLN: 1.0000 | Freq: Once | ORAL | 0 refills | Status: AC

## 2024-05-30 NOTE — Progress Notes (Signed)
 Pre visit completed in person;  Patient verified name, DOB, and address; No egg or soy allergy known to patient;  No issues known to pt with past sedation with any surgeries or procedures; Patient denies ever being told they had issues or difficulty with intubation; No FH of Malignant Hyperthermia; Pt is not on diet pills; Pt is not on home 02;  Pt is not on blood thinners;  Pt denies issues with constipation;  No A fib or A flutter; Have any cardiac testing pending--NO Insurance verified during PV appt--- [facility] Medicare Pt can ambulate without assistance;  Pt denies use of chewing tobacco; Discussed diabetic/weight loss medication holds; Discussed NSAID holds; Checked BMI to be less than 50; Pt instructed to use [employer] or GoodRx for a price reduction on prep  Patient's chart reviewed by Norleen Schillings CNRA prior to previsit and patient appropriate for the [facility]; Pre visit completed and red dot placed by patient's name on their procedure day (on provider's schedule); Instructions printed and given to patient at time of PV appt; also printed and given to family member per patient request;

## 2024-06-13 ENCOUNTER — Encounter: Admitting: Gastroenterology
